# Patient Record
Sex: Male | Born: 1940 | Race: White | Hispanic: No | Marital: Married | Smoking: Former smoker
Health system: Southern US, Community
[De-identification: ages and names within clinical notes are randomized; demographics above are authoritative.]

## PROBLEM LIST (undated history)

## (undated) DIAGNOSIS — D72829 Elevated white blood cell count, unspecified: Secondary | ICD-10-CM

## (undated) DIAGNOSIS — D649 Anemia, unspecified: Secondary | ICD-10-CM

## (undated) DIAGNOSIS — K589 Irritable bowel syndrome without diarrhea: Secondary | ICD-10-CM

## (undated) DIAGNOSIS — E119 Type 2 diabetes mellitus without complications: Secondary | ICD-10-CM

## (undated) DIAGNOSIS — I350 Nonrheumatic aortic (valve) stenosis: Secondary | ICD-10-CM

## (undated) DIAGNOSIS — R972 Elevated prostate specific antigen [PSA]: Secondary | ICD-10-CM

## (undated) DIAGNOSIS — N183 Chronic kidney disease, stage 3 unspecified: Secondary | ICD-10-CM

## (undated) DIAGNOSIS — I10 Essential (primary) hypertension: Secondary | ICD-10-CM

## (undated) DIAGNOSIS — I4510 Unspecified right bundle-branch block: Secondary | ICD-10-CM

## (undated) DIAGNOSIS — K219 Gastro-esophageal reflux disease without esophagitis: Secondary | ICD-10-CM

## (undated) DIAGNOSIS — E669 Obesity, unspecified: Secondary | ICD-10-CM

## (undated) DIAGNOSIS — M1990 Unspecified osteoarthritis, unspecified site: Secondary | ICD-10-CM

## (undated) DIAGNOSIS — N529 Male erectile dysfunction, unspecified: Secondary | ICD-10-CM

## (undated) DIAGNOSIS — M720 Palmar fascial fibromatosis [Dupuytren]: Secondary | ICD-10-CM

## (undated) DIAGNOSIS — E785 Hyperlipidemia, unspecified: Secondary | ICD-10-CM

## (undated) DIAGNOSIS — B351 Tinea unguium: Secondary | ICD-10-CM

## (undated) DIAGNOSIS — I2510 Atherosclerotic heart disease of native coronary artery without angina pectoris: Secondary | ICD-10-CM

## (undated) HISTORY — DX: Nonrheumatic aortic (valve) stenosis: I35.0

## (undated) HISTORY — DX: Type 2 diabetes mellitus without complications: E11.9

## (undated) HISTORY — DX: Anemia, unspecified: D64.9

## (undated) HISTORY — PX: OTHER SURGICAL HISTORY: SHX169

## (undated) HISTORY — DX: Tinea unguium: B35.1

## (undated) HISTORY — DX: Palmar fascial fibromatosis (dupuytren): M72.0

## (undated) HISTORY — DX: Unspecified right bundle-branch block: I45.10

## (undated) HISTORY — DX: Hyperlipidemia, unspecified: E78.5

## (undated) HISTORY — DX: Essential (primary) hypertension: I10

## (undated) HISTORY — DX: Atherosclerotic heart disease of native coronary artery without angina pectoris: I25.10

## (undated) HISTORY — DX: Irritable bowel syndrome, unspecified: K58.9

## (undated) HISTORY — DX: Chronic kidney disease, stage 3 unspecified: N18.30

## (undated) HISTORY — DX: Obesity, unspecified: E66.9

## (undated) HISTORY — DX: Elevated prostate specific antigen (PSA): R97.20

## (undated) HISTORY — DX: Male erectile dysfunction, unspecified: N52.9

## (undated) HISTORY — PX: APPENDECTOMY: SHX54

---
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
## 1993-01-06 DIAGNOSIS — I219 Acute myocardial infarction, unspecified: Secondary | ICD-10-CM

## 1993-01-06 HISTORY — DX: Acute myocardial infarction, unspecified: I21.9

## 1998-01-09 ENCOUNTER — Ambulatory Visit (HOSPITAL_BASED_OUTPATIENT_CLINIC_OR_DEPARTMENT_OTHER): Admission: RE | Admit: 1998-01-09 | Discharge: 1998-01-09 | Payer: Self-pay | Admitting: Orthopedic Surgery

## 1998-12-17 ENCOUNTER — Other Ambulatory Visit: Admission: RE | Admit: 1998-12-17 | Discharge: 1998-12-17 | Payer: Self-pay | Admitting: Urology

## 1999-05-22 ENCOUNTER — Encounter: Payer: Self-pay | Admitting: Cardiology

## 1999-05-22 ENCOUNTER — Ambulatory Visit (HOSPITAL_COMMUNITY): Admission: RE | Admit: 1999-05-22 | Discharge: 1999-05-23 | Payer: Self-pay | Admitting: Cardiology

## 2000-04-15 ENCOUNTER — Encounter: Payer: Self-pay | Admitting: Emergency Medicine

## 2000-04-16 ENCOUNTER — Observation Stay (HOSPITAL_COMMUNITY): Admission: EM | Admit: 2000-04-16 | Discharge: 2000-04-16 | Payer: Self-pay | Admitting: Emergency Medicine

## 2000-09-30 ENCOUNTER — Encounter (INDEPENDENT_AMBULATORY_CARE_PROVIDER_SITE_OTHER): Payer: Self-pay | Admitting: Specialist

## 2000-09-30 ENCOUNTER — Ambulatory Visit (HOSPITAL_BASED_OUTPATIENT_CLINIC_OR_DEPARTMENT_OTHER): Admission: RE | Admit: 2000-09-30 | Discharge: 2000-09-30 | Payer: Self-pay | Admitting: Plastic Surgery

## 2001-07-01 ENCOUNTER — Encounter: Payer: Self-pay | Admitting: Orthopedic Surgery

## 2001-07-01 ENCOUNTER — Ambulatory Visit (HOSPITAL_COMMUNITY): Admission: RE | Admit: 2001-07-01 | Discharge: 2001-07-01 | Payer: Self-pay | Admitting: Orthopedic Surgery

## 2003-06-01 ENCOUNTER — Encounter: Admission: RE | Admit: 2003-06-01 | Discharge: 2003-06-01 | Payer: Self-pay | Admitting: Rheumatology

## 2004-03-05 ENCOUNTER — Encounter (INDEPENDENT_AMBULATORY_CARE_PROVIDER_SITE_OTHER): Payer: Self-pay | Admitting: *Deleted

## 2004-03-05 ENCOUNTER — Ambulatory Visit (HOSPITAL_COMMUNITY): Admission: RE | Admit: 2004-03-05 | Discharge: 2004-03-05 | Payer: Self-pay | Admitting: Gastroenterology

## 2005-11-04 ENCOUNTER — Ambulatory Visit (HOSPITAL_BASED_OUTPATIENT_CLINIC_OR_DEPARTMENT_OTHER): Admission: RE | Admit: 2005-11-04 | Discharge: 2005-11-04 | Payer: Self-pay | Admitting: Orthopedic Surgery

## 2009-05-02 ENCOUNTER — Observation Stay (HOSPITAL_COMMUNITY): Admission: EM | Admit: 2009-05-02 | Discharge: 2009-05-02 | Payer: Self-pay | Admitting: Internal Medicine

## 2009-05-02 ENCOUNTER — Ambulatory Visit: Payer: Self-pay | Admitting: Internal Medicine

## 2010-03-26 LAB — COMPREHENSIVE METABOLIC PANEL
Albumin: 3.7 g/dL (ref 3.5–5.2)
BUN: 20 mg/dL (ref 6–23)
Calcium: 9.4 mg/dL (ref 8.4–10.5)
Creatinine, Ser: 1.32 mg/dL (ref 0.4–1.5)
Glucose, Bld: 159 mg/dL — ABNORMAL HIGH (ref 70–99)
Potassium: 4 mEq/L (ref 3.5–5.1)
Total Protein: 7.4 g/dL (ref 6.0–8.3)

## 2010-03-26 LAB — GLUCOSE, CAPILLARY: Glucose-Capillary: 215 mg/dL — ABNORMAL HIGH (ref 70–99)

## 2010-03-26 LAB — CBC
HCT: 35.2 % — ABNORMAL LOW (ref 39.0–52.0)
Hemoglobin: 12.4 g/dL — ABNORMAL LOW (ref 13.0–17.0)
MCHC: 35.3 g/dL (ref 30.0–36.0)
MCV: 92.8 fL (ref 78.0–100.0)
Platelets: 249 10*3/uL (ref 150–400)
RBC: 3.79 MIL/uL — ABNORMAL LOW (ref 4.22–5.81)
RDW: 13.1 % (ref 11.5–15.5)
WBC: 8.9 10*3/uL (ref 4.0–10.5)

## 2010-03-26 LAB — BRAIN NATRIURETIC PEPTIDE: Pro B Natriuretic peptide (BNP): <30 pg/mL (ref 0.0–100.0)

## 2010-03-26 LAB — LIPID PANEL
Cholesterol: 121 mg/dL (ref 0–200)
HDL: 39 mg/dL — ABNORMAL LOW (ref >39)
LDL Cholesterol: 64 mg/dL (ref 0–99)
Total CHOL/HDL Ratio: 3.1 RATIO
VLDL: 18 mg/dL (ref 0–40)

## 2010-03-26 LAB — DIFFERENTIAL
Basophils Absolute: 0.1 10*3/uL (ref 0.0–0.1)
Basophils Relative: 1 % (ref 0–1)
Eosinophils Absolute: 0.2 10*3/uL (ref 0.0–0.7)
Eosinophils Relative: 2 % (ref 0–5)
Monocytes Absolute: 0.7 10*3/uL (ref 0.1–1.0)
Neutro Abs: 5.3 10*3/uL (ref 1.7–7.7)

## 2010-03-26 LAB — HEMOGLOBIN A1C
Hgb A1c MFr Bld: 8.2 % — ABNORMAL HIGH (ref <5.7)
Mean Plasma Glucose: 189 mg/dL — ABNORMAL HIGH (ref <117)

## 2010-03-26 LAB — PROTIME-INR: Prothrombin Time: 13.4 seconds (ref 11.6–15.2)

## 2010-03-26 LAB — POCT CARDIAC MARKERS
CKMB, poc: 1.3 ng/mL (ref 1.0–8.0)
Myoglobin, poc: 76.2 ng/mL (ref 12–200)
Myoglobin, poc: 81.6 ng/mL (ref 12–200)

## 2010-03-26 LAB — TROPONIN I: Troponin I: <0.01 ng/mL (ref 0.00–0.06)

## 2010-05-24 NOTE — H&P (Signed)
[place]. [facility]  Patient:    Barry Zavala, Barry Zavala                     MRN: [id number] Adm. Date:  [id number] Attending:  Corliss Marcus CC:         Francisca December, M.D.  Demetria Pore. Coral Spikes, M.D.   History and Physical  REASON FOR ADMISSION:  Chest discomfort.  SUBJECTIVE:  [name] is a 70 year old, gentleman with a history of coronary artery disease, who suffered an inferior myocardial infarction in 1995 and underwent angioplasty and stenting of the right coronary by Dr. Corliss Marcus.  He subsequently had circumflex angioplasty and stent during that same hospital admission.  He has been asymptomatic.  Screening stress test in mid spring of 2001 demonstrated anterior ischemia and repeat cardiac catheterization revealed significant LAD and diagonal disease.  He subsequently underwent LAD stenting and Cutting Balloon angioplasty on the diagonal and has been asymptomatic.  This evening, the patient developed perhaps 15 minutes of substernal chest pressure that was mild in intensity and had no associated symptoms.  He called me about this immediately.  While we spoke about it on the phone, the discomfort resolved.  We had planned for him to come to the office to be evaluated by Dr. Amil Amen later today.  After we finished speaking on the phone he had another episode of very mild substernal discomfort.  I had instructed him to come to the emergency room if he had any recurrence of pain following the initially episode.  In the emergency room, the ECG did not reveal acute ischemic changes and he was totally pain-free. He is admitted to the hospital to rule out myocardial infarction and for possible further cardiac evaluation per Dr. Amil Amen.  PAST MEDICAL HISTORY:  Significant medical problems: 1. Hyperlipidemia. 2. Diabetes. 3. Atherosclerotic cardiovascular disease as outlined above. 4. Prior history of elevated PSA.  ALLERGIES:  None.  HABITS:  Does  not drink.  Stopped smoking in 1986.  FAMILY HISTORY:  Positive for diabetes, ovarian cancer and heart disease above age 45.  REVIEW OF SYSTEMS:  Unremarkable.  MEDICATIONS: 1. Avandia 8 mg p.o. q.p.m. 2. Zocor 40 mg p.o. q.d. 3. Glucophage 1000 mg p.o. b.i.d. 4. Multivitamin one per day. 5. Glucotrol XL 10 mg per day.  OBJECTIVE:  GENERAL:  On examination, [name] is in no distress.  SKIN:  His skin color is normal.  VITAL SIGNS:  Blood pressure is 132/72, the heart rate is 70, respirations are 16 and nonlabored.  HEENT:  Examination grossly unremarkable.  No xanthelasma.  NECK:  No JVD or carotid bruits.  LUNGS:  Clear to auscultation and percussion.  CARDIAC:  Positive S4 but otherwise unremarkable.  ABDOMEN:  Soft.  EXTREMITIES:  Reveal no edema.  Femoral are 2+.  NEUROLOGICAL: Examination reveals an alert, oriented patient x 3.  LABORATORY AND ACCESSORY DATA:  ECG reveals evidence of old inferior infarction with small inferior Q waves but no acute ischemic change.  Laboratory data includes a potassium of 3.9, creatinine of 1.2.   The initial CPK is normal.  The troponin I is less than 0.01.  ASSESSMENT: 1. Recurrent episodes of chest discomfort of anginal quality. 2. History of coronary atherosclerotic heart disease with prior inferior    infarction and circumflex and right coronary stent, 1995.  More recent    left anterior descending diagonal intervention with left anterior    descending stent and diagonal Cutting Balloon May 1991.  3. Hyperlipidemia. 4. Diabetes.  RECOMMENDATIONS:  Admit to rule out unit.  Serial enzymes.  Serial ECGs. Evaluation per Dr. Corliss Marcus as indicated. DD:  04/16/00 TD:  04/16/00 Job: [id number]

## 2010-05-24 NOTE — Op Note (Signed)
NAME:  Barry Zavala, Barry Zavala                ACCOUNT NO.:  i   MEDICAL RECORD NO.:  [id number]          PATIENT TYPE:  AMB   LOCATION:  DSC                          FACILITY:  [facility]   PHYSICIAN:  Katy Fitch. Sypher, M.D. DATE OF BIRTH:  Jan 06, 1941   DATE OF PROCEDURE:  11/04/2005  DATE OF DISCHARGE:                                 OPERATIVE REPORT   PREOPERATIVE DIAGNOSES:  1. 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  2. 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   POSTOPERATIVE DIAGNOSES:  1. Inflammatory tenosynovitis of left long finger, with fibrosis of A1      pulley.  2. Fibromatosis of long finger pre-tendinous fibers.   OPERATION:  1. 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  2. Release of left long finger A1 pulley.   SURGEON:  Katy Fitch. Sypher, M.D.   ASSISTANT:  Marveen Reeks. Dasnoit P.A.-C.   ANESTHESIA:  Is 2% lidocaine palmar block, supplemented by IV sedation.   SUPERVISING ANESTHESIOLOGIST:  Germaine Pomfret, M.D.   INDICATIONS:  [name] is a 70 year old gentleman who presents  for evaluation of chronic stenosing tenosynovitis of his left long finger.  He has a history of stenosing tenosynovitis affecting his left ring finger  and has had Dupuytren's palmar fibromatosis cords resected from the ring  finger ray in the past.   He has type 2 diabetes.  He has failed nonoperative management of his  Dupuytren's contracture and his stenosing tenosynovitis.  Therefore he is  brought to the operating at this time for release of his left long finger A1  pulley and release of his left long finger pre-tendinous fiber contracture.   DESCRIPTION OF PROCEDURE:  After informed consent he is brought to the  operating room at this time.  [name] is brought to the  operating room and placed in the supine position upon the operating table.  Following light sedation,  the left arm was prepped with Betadine soap and  solution and sterilely draped.  A pneumatic tourniquet was applied to the  proximal left brachium.  Following exsanguination of the left arm with an  Esmarch bandage, an arterial tourniquet on the proximal brachium was  inflated to 140 mmHg.  The procedure commenced with a Brunner zigzag  incision fashioned over the A1 pulley, extending from the middle palmar  crease across the distal palmar crease towards the proximal flexion crease  of the long finger.  The subcutaneous tissues were carefully divided, taking  care to gently identify the pre-tendinous fibers of palmar fascia.  Mr.  Barry Zavala had a remarkable amount of adipose tissue superficial to the pre-  tendinous fibers.  This was carefully dissected, identifying the underlying  fascia.  The fascia was resected.  The A1 pulley was invested in a thick  inflammatory cuff of fibrovascular tissue.  This was cleared with a rongeur  and Freer.  The A1 pulley was isolated and split along its radial border  with scissors and scalpel.  The flexor tendons  were delivered and found to  be invested with fibrotic teno-synovium.  This was removed with scissors and  rongeur dissection.  [name] demonstrated a full range of motion of his  left fingers after his sedation cleared.  The wound was inspected for  bleeding points and subsequently repaired with mattress sutures of #5-0  nylon.  There no apparent complications.  For after-care he is placed in a  compressive dressing with Xeroflo,  sterile gauze and an Ace wrap.   We have encouraged him to get immediate range of motion exercises.  We will  see him back for followup in one week for suture removal.  He is provided  Darvocet-N-100, one p.o. q.4-6 hours p.r.n.  pain, #20 tablets, without  refill.      Katy Fitch Sypher, M.D.  Electronically Signed     RVS/MEDQ  D:  11/04/2005  T:  11/04/2005  Job:  [id number]   cc:   Leatha Gilding. Mezer, M.D.   Demetria Pore. Coral Spikes, M.D.

## 2010-05-24 NOTE — Op Note (Signed)
[place]. [facility]  Patient:    Barry Zavala, Barry Zavala                     MRN: [id number] Proc. Date: 05/22/99 Adm. Date:  [id number] Disc. Date: [id number] Attending:  Corliss Zavala CC:         Barry Zavala. Barry Zavala, M.D.             Cardiac Cath. Lab.                           Operative Report  CINE NUMBER:  [id number]  REFERRING PHYSICIAN:  Dr. Lennox Zavala.  PROCEDURE PERFORMED: 1. Left heart catheterization. 2. Coronary angiography. 3. Left ventriculogram. 4. Percutaneous transluminal coronary angioplasty/cutting balloon second    diagonal. 5. Percutaneous transluminal coronary angioplasty/stent mid to distal left    anterior descending. 6. Percutaneous closure right femoral artery.  INDICATIONS FOR PROCEDURE:  [name] is a 70 year old man who is six years status post PTCA and stent of the right coronary and obtuse marginal branches.  He has done well without recurrent angina, and underwent a screening exercise Cardiolite study which was significant for evidence of anterolateral wall ischemia.  He is brought now to the catheterization laboratory to identify possible progressive disease and provide for further therapeutic options.  PROCEDURAL NOTE:  The patient is brought to the cardiac catheterization laboratory in the postabsorptive state.  The right groin was prepped and draped in the usual sterile fashion.  Local anesthesia was obtained with the infiltration of 1% Lidocaine.  A #5 French catheter sheath was introduced percutaneously into the right femoral artery utilizing an anterior approach over a guiding J-wire.  A #5 French 110 cm pigtail catheter was then advanced to the ascending aorta where the pressure was recorded.  The catheter was then prolapsed across the aortic valve and the pressure again recorded in the left ventricle both prior to and following the ventriculogram.  A 30 degree RAO cine left ventriculogram was performed  using a power injector.  Omnipaque 45 cc was injected at 13 cc/sec.  Coronary angiography was then performed following the sublingual administration of 0.4 mg nitroglycerin.  The patient had also received 1500 units of heparin prior to placement of the pigtail catheter.  Cineangiography of each coronary artery was conducted in multiple LAO and RAO projections using #5 [place] #4 left and right Judkins catheters.  We then proceeded with preparations for coronary intervention.  The #5 [place] catheter sheath was exchanged for #7 [place] catheter sheath over a long guiding J-wire.  The patient received an additional 3500 units of heparin and a double bolus of Integrilin.  A #7 French 3.5 [place]oda Sci-Med WiseGuide guiding catheter was advanced to the ascending aorta where the left coronary os was engaged.  A 0.14 inch ACS Hi-Torque floppy extra support guide wire was advanced across the lesion into the second diagonal without difficulty.  This was followed by a 2.5/10 mm cutting balloon.  This was advanced without excessive difficulty across the stenosis in the proximal large second diagonal.  It was inflated on two occasions to a maximum of 6 atmospheres for a maximum of two minutes.  The patient did experience burning anterior chest pain with this.  Following this, the balloon was removed.  The wire was advanced down the main trunk of the left anterior descending artery.  A 3.0/81mm Sci-Med NIR Royale intracoronary stent was advanced across the midvessel stenosis.  This was ultimately deployed at a pressure of 16 atmospheres.  The stent balloon and guide wire were removed.  Adequate patency was confirmed on orthogonal views by cineangiography.  The guiding catheter was removed. The sheath was removed as well and hemostasis is obtained at the puncture site in the right femoral artery utilizing the Perclose percutaneous closure system.  The patient was transported to the recovery area in  stable condition with intact distal pulses.  FLUOROSCOPY TIME:  13.2 minutes.  TOTAL CONTRAST UTILIZED:  300 cc of Omnipaque and Visipaque.  HEMODYNAMICS:  Systemic arterial pressure was 117/72 with a mean of 92 mmHg. The left ventricular end-diastolic pressure was 12 mmHg preventriculogram and 14 mmHg postventriculogram.  There was no systolic gradient across the aortic valve.  ANGIOGRAPHY:  The left ventriculogram demonstrated normal chamber size and normal global systolic function with a calculated ejection fraction utilizing a single plane cine method of 68%.  There were no regional wall motion abnormalities.  There is no mitral regurgitation.  There was left and right coronary calcification seen.  There was a right dominant coronary system present.  The main left coronary artery appeared normal.  The left anterior descending artery and its branches were moderately and diffusely diseased.  The first diagonal branch is small.  The second diagonal branch is large.  The third diagonal branch is small.  The second diagonal branch had an 75% proximal stenosis.  There were luminal irregularities and 20-30% stenoses throughout the course of the left anterior descending artery in the mid to distal segment.  After the third diagonal branch, there was a 50-60% stenosis.  The left circumflex artery and its branches were mildly diseased; a single large marginal branch arose which was the site of previous angioplasty.  This vessel demonstrates no significant obstructions.  In the proximal circumflex, there is a tapering 20-30% stenosis as it arises from the left main.  The ongoing circumflex is small and gives rise to two small posterolateral branches.  The right coronary artery and its branches were mildly diseased; again, there are luminal irregularities and 20-30% stenoses seen throughout the course of the vessel.  The proximal segment is the site of the J and J stent implantation in  1995.  It is widely patent.  The vessel gives rise to a  moderate to large posterior descending artery which is free of significant disease.  There is a moderate sized posterolateral segment and two small left ventricular branches and then a large third left ventricular branch.  No significant obstructions are seen within the course of these vessels.  There are no collateral vessels seen.  Following balloon dilatation using a cutting balloon in the diagonal branch, there is a 10% residual stenosis in the diagonal branch and no residual stenosis in the stented segment of the left anterior descending artery.  FINAL IMPRESSION: 1. Arteriosclerotic cardiovascular disease, three vessel. 2. Status post successful cutting balloon percutaneous transluminal coronary    angioplasty of the diagonal branch. 3. Status post successful percutaneous transluminal coronary angioplasty stent    implantation mid-to-distal left anterior descending. 4. Intact left ventricular size and systolic function. DD:  05/22/99 TD:  05/26/99 Job: [id number]

## 2010-05-24 NOTE — Op Note (Signed)
[place]. [facility]  Patient:    Barry Zavala, PIGNATO Visit Number: [id number] MRN: [id number]          Service Type: DSU Location: [facility] Attending Physician:  Chapman Moss Dictated by:   Teena Irani. Odis Luster, M.D. Proc. Date: 09/30/00 Admit Date:  09/30/2000                             Operative Report  PREOPERATIVE DIAGNOSES: 1. Cutaneous lesion right cheek of undetermined behavior, enlarging. 2. Skin lesion left forehead, enlarging and of undetermined behavior.  POSTOPERATIVE DIAGNOSIS: 1. Cutaneous lesion right cheek of undetermined behavior, enlarging. 2. Skin lesion left forehead, enlarging and of undetermined behavior. 3. Wounds cheek and forehead.  PROCEDURES PERFORMED: 1. Excision lesion right cheek, subcutaneous, undetermined behavior, greater    than 0.5 cm. 2. Excision lesion left forehead, skin, undetermined behavior. 3. Intermediate wound closures greater than 1.0 cm in total length.  SURGEON:  Teena Irani. Odis Luster, M.D.  ANESTHESIA:  1% Xylocaine with epinephrine plus bicarbonate.  CLINICAL ______:  A 70 year old man with lesion right cheek that seems to be within the soft tissues that is growing, enlarging.  He is very concerned about it.  In addition, he has a lesion on his left forehead that has changed and he is concerned about it as well and would like to have that excised.  The nature of these procedures and risks and complications were discussed with him and he understood all this and wished to proceed.  He understood that there would be some scars associated with these areas and these excisions.  DESCRIPTION OF PROCEDURE:  The patient was placed supine.  The elliptical incisions were marked in the direction of the relaxed skin tension lines.  He was prepped with Betadine and draped with sterile drapes.  Successful local anesthesia was achieved.  The elliptical excision performed, excising the subcutaneous lesion down to the  depth and excised in its entirety.  Good hemostasis was noted.  The wound was irrigated thoroughly and layered closure 5-0 Monocryl interrupted inverted deep sutures, 5-0 Monocryl interrupted inverted deep dermal sutures, and 6-0 Prolene simple interrupted sutures.  The elliptical excision of the forehead lesion was then performed.  The wound irrigated thoroughly.  Good hemostasis again noted and again 5-0 Monocryl interrupted inverted deep suture and 6-0 Prolene simple interrupted sutures. Antibiotic ointment and dry, sterile dressings applied and he tolerated the procedure well.  DISPOSITION:  May shower tomorrow and reapply antibiotic ointment once. Activity ad lib.  Tylenol for pain.  See me back next week for recheck and suture removal. Dictated by:   Teena Irani. Odis Luster, M.D. Attending Physician:  Chapman Moss DD:  09/30/00 TD:  09/30/00 Job: [id number]

## 2011-01-20 DIAGNOSIS — M720 Palmar fascial fibromatosis [Dupuytren]: Secondary | ICD-10-CM | POA: Diagnosis not present

## 2011-01-20 DIAGNOSIS — M6530 Trigger finger, unspecified finger: Secondary | ICD-10-CM | POA: Diagnosis not present

## 2011-02-12 DIAGNOSIS — M6530 Trigger finger, unspecified finger: Secondary | ICD-10-CM | POA: Diagnosis not present

## 2011-02-18 DIAGNOSIS — I2510 Atherosclerotic heart disease of native coronary artery without angina pectoris: Secondary | ICD-10-CM | POA: Diagnosis not present

## 2011-02-18 DIAGNOSIS — B351 Tinea unguium: Secondary | ICD-10-CM | POA: Diagnosis not present

## 2011-02-18 DIAGNOSIS — I10 Essential (primary) hypertension: Secondary | ICD-10-CM | POA: Diagnosis not present

## 2011-02-18 DIAGNOSIS — Z79899 Other long term (current) drug therapy: Secondary | ICD-10-CM | POA: Diagnosis not present

## 2011-02-18 DIAGNOSIS — IMO0001 Reserved for inherently not codable concepts without codable children: Secondary | ICD-10-CM | POA: Diagnosis not present

## 2011-02-18 DIAGNOSIS — I4510 Unspecified right bundle-branch block: Secondary | ICD-10-CM | POA: Diagnosis not present

## 2011-02-18 DIAGNOSIS — Z0000 Encounter for general adult medical examination without abnormal findings: Secondary | ICD-10-CM | POA: Diagnosis not present

## 2011-02-18 DIAGNOSIS — E782 Mixed hyperlipidemia: Secondary | ICD-10-CM | POA: Diagnosis not present

## 2011-03-11 DIAGNOSIS — I2510 Atherosclerotic heart disease of native coronary artery without angina pectoris: Secondary | ICD-10-CM | POA: Diagnosis not present

## 2011-03-11 DIAGNOSIS — N058 Unspecified nephritic syndrome with other morphologic changes: Secondary | ICD-10-CM | POA: Diagnosis not present

## 2011-03-11 DIAGNOSIS — E785 Hyperlipidemia, unspecified: Secondary | ICD-10-CM | POA: Diagnosis not present

## 2011-03-11 DIAGNOSIS — E1129 Type 2 diabetes mellitus with other diabetic kidney complication: Secondary | ICD-10-CM | POA: Diagnosis not present

## 2011-04-30 DIAGNOSIS — N138 Other obstructive and reflux uropathy: Secondary | ICD-10-CM | POA: Diagnosis not present

## 2011-04-30 DIAGNOSIS — N401 Enlarged prostate with lower urinary tract symptoms: Secondary | ICD-10-CM | POA: Diagnosis not present

## 2011-05-01 DIAGNOSIS — H2510 Age-related nuclear cataract, unspecified eye: Secondary | ICD-10-CM | POA: Diagnosis not present

## 2011-05-01 DIAGNOSIS — E119 Type 2 diabetes mellitus without complications: Secondary | ICD-10-CM | POA: Diagnosis not present

## 2011-05-13 DIAGNOSIS — R972 Elevated prostate specific antigen [PSA]: Secondary | ICD-10-CM | POA: Diagnosis not present

## 2011-05-13 DIAGNOSIS — N401 Enlarged prostate with lower urinary tract symptoms: Secondary | ICD-10-CM | POA: Diagnosis not present

## 2011-05-13 DIAGNOSIS — N138 Other obstructive and reflux uropathy: Secondary | ICD-10-CM | POA: Diagnosis not present

## 2011-06-11 DIAGNOSIS — B351 Tinea unguium: Secondary | ICD-10-CM | POA: Diagnosis not present

## 2011-06-11 DIAGNOSIS — E782 Mixed hyperlipidemia: Secondary | ICD-10-CM | POA: Diagnosis not present

## 2011-06-11 DIAGNOSIS — Z79899 Other long term (current) drug therapy: Secondary | ICD-10-CM | POA: Diagnosis not present

## 2011-06-11 DIAGNOSIS — I10 Essential (primary) hypertension: Secondary | ICD-10-CM | POA: Diagnosis not present

## 2011-06-11 DIAGNOSIS — E785 Hyperlipidemia, unspecified: Secondary | ICD-10-CM | POA: Diagnosis not present

## 2011-06-11 DIAGNOSIS — Z0000 Encounter for general adult medical examination without abnormal findings: Secondary | ICD-10-CM | POA: Diagnosis not present

## 2011-06-11 DIAGNOSIS — N058 Unspecified nephritic syndrome with other morphologic changes: Secondary | ICD-10-CM | POA: Diagnosis not present

## 2011-06-11 DIAGNOSIS — I4510 Unspecified right bundle-branch block: Secondary | ICD-10-CM | POA: Diagnosis not present

## 2011-06-11 DIAGNOSIS — E1165 Type 2 diabetes mellitus with hyperglycemia: Secondary | ICD-10-CM | POA: Diagnosis not present

## 2011-06-11 DIAGNOSIS — I2510 Atherosclerotic heart disease of native coronary artery without angina pectoris: Secondary | ICD-10-CM | POA: Diagnosis not present

## 2011-06-11 DIAGNOSIS — IMO0001 Reserved for inherently not codable concepts without codable children: Secondary | ICD-10-CM | POA: Diagnosis not present

## 2011-06-11 DIAGNOSIS — E1129 Type 2 diabetes mellitus with other diabetic kidney complication: Secondary | ICD-10-CM | POA: Diagnosis not present

## 2011-09-10 DIAGNOSIS — M6530 Trigger finger, unspecified finger: Secondary | ICD-10-CM | POA: Diagnosis not present

## 2011-09-16 DIAGNOSIS — I2510 Atherosclerotic heart disease of native coronary artery without angina pectoris: Secondary | ICD-10-CM | POA: Diagnosis not present

## 2011-09-16 DIAGNOSIS — E1165 Type 2 diabetes mellitus with hyperglycemia: Secondary | ICD-10-CM | POA: Diagnosis not present

## 2011-09-16 DIAGNOSIS — E1129 Type 2 diabetes mellitus with other diabetic kidney complication: Secondary | ICD-10-CM | POA: Diagnosis not present

## 2011-09-16 DIAGNOSIS — N058 Unspecified nephritic syndrome with other morphologic changes: Secondary | ICD-10-CM | POA: Diagnosis not present

## 2011-09-16 DIAGNOSIS — Z23 Encounter for immunization: Secondary | ICD-10-CM | POA: Diagnosis not present

## 2011-12-16 DIAGNOSIS — E782 Mixed hyperlipidemia: Secondary | ICD-10-CM | POA: Diagnosis not present

## 2011-12-16 DIAGNOSIS — E119 Type 2 diabetes mellitus without complications: Secondary | ICD-10-CM | POA: Diagnosis not present

## 2011-12-16 DIAGNOSIS — I10 Essential (primary) hypertension: Secondary | ICD-10-CM | POA: Diagnosis not present

## 2011-12-16 DIAGNOSIS — I2510 Atherosclerotic heart disease of native coronary artery without angina pectoris: Secondary | ICD-10-CM | POA: Diagnosis not present

## 2011-12-17 DIAGNOSIS — E785 Hyperlipidemia, unspecified: Secondary | ICD-10-CM | POA: Diagnosis not present

## 2011-12-17 DIAGNOSIS — E1165 Type 2 diabetes mellitus with hyperglycemia: Secondary | ICD-10-CM | POA: Diagnosis not present

## 2011-12-17 DIAGNOSIS — E1129 Type 2 diabetes mellitus with other diabetic kidney complication: Secondary | ICD-10-CM | POA: Diagnosis not present

## 2011-12-17 DIAGNOSIS — I2510 Atherosclerotic heart disease of native coronary artery without angina pectoris: Secondary | ICD-10-CM | POA: Diagnosis not present

## 2011-12-17 DIAGNOSIS — N058 Unspecified nephritic syndrome with other morphologic changes: Secondary | ICD-10-CM | POA: Diagnosis not present

## 2011-12-26 DIAGNOSIS — I10 Essential (primary) hypertension: Secondary | ICD-10-CM | POA: Diagnosis not present

## 2012-02-25 DIAGNOSIS — I2510 Atherosclerotic heart disease of native coronary artery without angina pectoris: Secondary | ICD-10-CM | POA: Diagnosis not present

## 2012-02-25 DIAGNOSIS — I4510 Unspecified right bundle-branch block: Secondary | ICD-10-CM | POA: Diagnosis not present

## 2012-02-25 DIAGNOSIS — B351 Tinea unguium: Secondary | ICD-10-CM | POA: Diagnosis not present

## 2012-02-25 DIAGNOSIS — Z0000 Encounter for general adult medical examination without abnormal findings: Secondary | ICD-10-CM | POA: Diagnosis not present

## 2012-02-25 DIAGNOSIS — Z1331 Encounter for screening for depression: Secondary | ICD-10-CM | POA: Diagnosis not present

## 2012-02-25 DIAGNOSIS — E782 Mixed hyperlipidemia: Secondary | ICD-10-CM | POA: Diagnosis not present

## 2012-02-25 DIAGNOSIS — Z79899 Other long term (current) drug therapy: Secondary | ICD-10-CM | POA: Diagnosis not present

## 2012-02-25 DIAGNOSIS — I10 Essential (primary) hypertension: Secondary | ICD-10-CM | POA: Diagnosis not present

## 2012-02-25 DIAGNOSIS — IMO0001 Reserved for inherently not codable concepts without codable children: Secondary | ICD-10-CM | POA: Diagnosis not present

## 2012-04-06 DIAGNOSIS — E118 Type 2 diabetes mellitus with unspecified complications: Secondary | ICD-10-CM | POA: Diagnosis not present

## 2012-04-06 DIAGNOSIS — I10 Essential (primary) hypertension: Secondary | ICD-10-CM | POA: Diagnosis not present

## 2012-04-06 DIAGNOSIS — Z1331 Encounter for screening for depression: Secondary | ICD-10-CM | POA: Diagnosis not present

## 2012-04-06 DIAGNOSIS — N058 Unspecified nephritic syndrome with other morphologic changes: Secondary | ICD-10-CM | POA: Diagnosis not present

## 2012-04-06 DIAGNOSIS — E785 Hyperlipidemia, unspecified: Secondary | ICD-10-CM | POA: Diagnosis not present

## 2012-04-28 DIAGNOSIS — B351 Tinea unguium: Secondary | ICD-10-CM | POA: Diagnosis not present

## 2012-04-28 DIAGNOSIS — IMO0001 Reserved for inherently not codable concepts without codable children: Secondary | ICD-10-CM | POA: Diagnosis not present

## 2012-04-28 DIAGNOSIS — N183 Chronic kidney disease, stage 3 unspecified: Secondary | ICD-10-CM | POA: Diagnosis not present

## 2012-05-10 DIAGNOSIS — E119 Type 2 diabetes mellitus without complications: Secondary | ICD-10-CM | POA: Diagnosis not present

## 2012-05-10 DIAGNOSIS — H2510 Age-related nuclear cataract, unspecified eye: Secondary | ICD-10-CM | POA: Diagnosis not present

## 2012-05-10 DIAGNOSIS — H26019 Infantile and juvenile cortical, lamellar, or zonular cataract, unspecified eye: Secondary | ICD-10-CM | POA: Diagnosis not present

## 2012-05-18 DIAGNOSIS — N401 Enlarged prostate with lower urinary tract symptoms: Secondary | ICD-10-CM | POA: Diagnosis not present

## 2012-05-18 DIAGNOSIS — R972 Elevated prostate specific antigen [PSA]: Secondary | ICD-10-CM | POA: Diagnosis not present

## 2012-05-18 DIAGNOSIS — N138 Other obstructive and reflux uropathy: Secondary | ICD-10-CM | POA: Diagnosis not present

## 2012-05-25 DIAGNOSIS — N401 Enlarged prostate with lower urinary tract symptoms: Secondary | ICD-10-CM | POA: Diagnosis not present

## 2012-05-25 DIAGNOSIS — N529 Male erectile dysfunction, unspecified: Secondary | ICD-10-CM | POA: Diagnosis not present

## 2012-05-25 DIAGNOSIS — N138 Other obstructive and reflux uropathy: Secondary | ICD-10-CM | POA: Diagnosis not present

## 2012-05-25 DIAGNOSIS — E119 Type 2 diabetes mellitus without complications: Secondary | ICD-10-CM | POA: Diagnosis not present

## 2012-05-25 DIAGNOSIS — R972 Elevated prostate specific antigen [PSA]: Secondary | ICD-10-CM | POA: Diagnosis not present

## 2012-06-09 DIAGNOSIS — I10 Essential (primary) hypertension: Secondary | ICD-10-CM | POA: Diagnosis not present

## 2012-06-09 DIAGNOSIS — E1165 Type 2 diabetes mellitus with hyperglycemia: Secondary | ICD-10-CM | POA: Diagnosis not present

## 2012-06-09 DIAGNOSIS — E785 Hyperlipidemia, unspecified: Secondary | ICD-10-CM | POA: Diagnosis not present

## 2012-06-09 DIAGNOSIS — I2510 Atherosclerotic heart disease of native coronary artery without angina pectoris: Secondary | ICD-10-CM | POA: Diagnosis not present

## 2012-06-09 DIAGNOSIS — N058 Unspecified nephritic syndrome with other morphologic changes: Secondary | ICD-10-CM | POA: Diagnosis not present

## 2012-06-09 DIAGNOSIS — E1129 Type 2 diabetes mellitus with other diabetic kidney complication: Secondary | ICD-10-CM | POA: Diagnosis not present

## 2012-06-09 DIAGNOSIS — Z6826 Body mass index (BMI) 26.0-26.9, adult: Secondary | ICD-10-CM | POA: Diagnosis not present

## 2012-06-10 DIAGNOSIS — E782 Mixed hyperlipidemia: Secondary | ICD-10-CM | POA: Diagnosis not present

## 2012-09-20 DIAGNOSIS — E785 Hyperlipidemia, unspecified: Secondary | ICD-10-CM | POA: Diagnosis not present

## 2012-09-20 DIAGNOSIS — N058 Unspecified nephritic syndrome with other morphologic changes: Secondary | ICD-10-CM | POA: Diagnosis not present

## 2012-09-20 DIAGNOSIS — Z23 Encounter for immunization: Secondary | ICD-10-CM | POA: Diagnosis not present

## 2012-09-20 DIAGNOSIS — I2510 Atherosclerotic heart disease of native coronary artery without angina pectoris: Secondary | ICD-10-CM | POA: Diagnosis not present

## 2012-09-20 DIAGNOSIS — Z6826 Body mass index (BMI) 26.0-26.9, adult: Secondary | ICD-10-CM | POA: Diagnosis not present

## 2012-09-20 DIAGNOSIS — E1129 Type 2 diabetes mellitus with other diabetic kidney complication: Secondary | ICD-10-CM | POA: Diagnosis not present

## 2012-09-20 DIAGNOSIS — I10 Essential (primary) hypertension: Secondary | ICD-10-CM | POA: Diagnosis not present

## 2012-10-27 ENCOUNTER — Other Ambulatory Visit: Payer: Self-pay | Admitting: Dermatology

## 2012-10-27 DIAGNOSIS — B079 Viral wart, unspecified: Secondary | ICD-10-CM | POA: Diagnosis not present

## 2012-10-27 DIAGNOSIS — D485 Neoplasm of uncertain behavior of skin: Secondary | ICD-10-CM | POA: Diagnosis not present

## 2012-10-27 DIAGNOSIS — D239 Other benign neoplasm of skin, unspecified: Secondary | ICD-10-CM | POA: Diagnosis not present

## 2012-11-22 DIAGNOSIS — R972 Elevated prostate specific antigen [PSA]: Secondary | ICD-10-CM | POA: Diagnosis not present

## 2012-11-29 DIAGNOSIS — N139 Obstructive and reflux uropathy, unspecified: Secondary | ICD-10-CM | POA: Diagnosis not present

## 2012-11-29 DIAGNOSIS — N529 Male erectile dysfunction, unspecified: Secondary | ICD-10-CM | POA: Diagnosis not present

## 2012-11-29 DIAGNOSIS — R972 Elevated prostate specific antigen [PSA]: Secondary | ICD-10-CM | POA: Diagnosis not present

## 2012-12-15 ENCOUNTER — Encounter: Payer: Self-pay | Admitting: Cardiology

## 2012-12-15 DIAGNOSIS — E785 Hyperlipidemia, unspecified: Secondary | ICD-10-CM

## 2012-12-15 DIAGNOSIS — I4510 Unspecified right bundle-branch block: Secondary | ICD-10-CM

## 2012-12-15 DIAGNOSIS — E119 Type 2 diabetes mellitus without complications: Secondary | ICD-10-CM | POA: Insufficient documentation

## 2012-12-15 DIAGNOSIS — I2510 Atherosclerotic heart disease of native coronary artery without angina pectoris: Secondary | ICD-10-CM

## 2012-12-15 DIAGNOSIS — E669 Obesity, unspecified: Secondary | ICD-10-CM

## 2012-12-15 DIAGNOSIS — I25119 Atherosclerotic heart disease of native coronary artery with unspecified angina pectoris: Secondary | ICD-10-CM | POA: Insufficient documentation

## 2012-12-15 DIAGNOSIS — I10 Essential (primary) hypertension: Secondary | ICD-10-CM | POA: Insufficient documentation

## 2012-12-16 ENCOUNTER — Encounter: Payer: Self-pay | Admitting: Interventional Cardiology

## 2012-12-16 ENCOUNTER — Ambulatory Visit (INDEPENDENT_AMBULATORY_CARE_PROVIDER_SITE_OTHER): Payer: Medicare Other | Admitting: Interventional Cardiology

## 2012-12-16 VITALS — BP 110/58 | HR 70 | Ht 71.0 in | Wt 187.0 lb

## 2012-12-16 DIAGNOSIS — E119 Type 2 diabetes mellitus without complications: Secondary | ICD-10-CM

## 2012-12-16 DIAGNOSIS — I10 Essential (primary) hypertension: Secondary | ICD-10-CM

## 2012-12-16 DIAGNOSIS — E785 Hyperlipidemia, unspecified: Secondary | ICD-10-CM | POA: Diagnosis not present

## 2012-12-16 DIAGNOSIS — I2510 Atherosclerotic heart disease of native coronary artery without angina pectoris: Secondary | ICD-10-CM

## 2012-12-16 DIAGNOSIS — I4510 Unspecified right bundle-branch block: Secondary | ICD-10-CM | POA: Diagnosis not present

## 2012-12-16 NOTE — Patient Instructions (Signed)
Your physician recommends that you continue on your current medications as directed. Please refer to the Current Medication list given to you today.  Your physician wants you to follow-up in: 1 year. You will receive a reminder letter in the mail two months in advance. If you don't receive a letter, please call our office to schedule the follow-up appointment.  

## 2012-12-16 NOTE — Progress Notes (Signed)
Patient ID: Barry Zavala, male   DOB: 07-Jul-1940, 72 y.o.   MRN: [id number]    [address] [place], [place]  [address] Phone: [phone] Fax:  [phone]  Date:  12/16/2012   ID:  Barry Zavala, DOB Mar 29, 1940, MRN [id number]  PCP:  No primary provider on file.   ASSESSMENT:  1. Coronary artery disease, stable without angina 2. Hypertension, controlled 3. Hyperlipidemia, stable 4. Diabetes mellitus under great control  PLAN:  1. Clinical followup in one year 2. Active lifestyle 3. Call if chest discomfort   SUBJECTIVE: Said Barry Zavala is a 72 y.o. male who is asymptomatic. He bought shoes for me last fall and I thank him. He has not needed nitroglycerin. He is physically active. His diabetes is being controlled. No medication side effects.   Wt Readings from Last 3 Encounters:  12/16/12 187 lb (84.823 kg)     Past Medical History  Diagnosis Date  . Coronary artery disease   . Hypertension   . Hyperlipidemia   . Type 2 diabetes mellitus   . Elevated PSA   . Onychomycosis   . Anemia   . Obesity   . Irritable bowel syndrome   . Erectile dysfunction   . Right bundle branch block   . Palmar fibromatosis     Current Outpatient Prescriptions  Medication Sig Dispense Refill  . aspirin 325 MG tablet Take 325 mg by mouth daily.      . Empagliflozin (JARDIANCE) 10 MG TABS Take by mouth.      . finasteride (PROSCAR) 5 MG tablet       . glyBURIDE-metformin (GLUCOVANCE) 5-500 MG per tablet Take 2 tablets by mouth 2 (two) times daily with a meal.      . metoprolol succinate (TOPROL-XL) 50 MG 24 hr tablet Take 50 mg by mouth daily. Take with or immediately following a meal.      . Multiple Vitamins-Minerals (CENTRUM SILVER PO) Take 1 tablet by mouth daily.      . nitroGLYCERIN (NITROSTAT) 0.4 MG SL tablet Place 0.4 mg under the tongue every 5 (five) minutes as needed for chest pain.      Marland Kitchen omega-3 acid ethyl esters (LOVAZA) 1 G capsule Take 1 g by  mouth 3 (three) times daily.      . pioglitazone (ACTOS) 45 MG tablet Take 45 mg by mouth daily.      . ramipril (ALTACE) 10 MG capsule Take 10 mg by mouth daily.      . saxagliptin HCl (ONGLYZA) 2.5 MG TABS tablet Take 2.5 mg by mouth daily.       No current facility-administered medications for this visit.    Allergies:   No Known Allergies  Social History:  The patient  reports that he has quit smoking. He does not have any smokeless tobacco history on file. He reports that he does not drink alcohol or use illicit drugs.   ROS:  Please see the history of present illness.      All other systems reviewed and negative.   OBJECTIVE: VS:  BP 110/58  Pulse 70  Ht 5\' 11"  (1.803 m)  Wt 187 lb (84.823 kg)  BMI 26.09 kg/m2 Well nourished, well developed, in no acute distress HEENT: normal Neck: JVD flat. Carotid bruit 2+  Cardiac:  normal S1, S2; RRR; no murmur Lungs:  clear to auscultation bilaterally, no wheezing, rhonchi or rales Abd: soft, nontender, no hepatomegaly Ext: Edema absent. Pulses 2+ Skin: warm and dry  Neuro:  CNs 2-12 intact, no focal abnormalities noted  EKG:  Right bundle branch block, otherwise normal       Signed, Darci Needle III, MD 12/16/2012 9:27 AM  Past Medical History  Coronary artery disease, left circumflex PTCA 1995, LAD bare-metal stent 2001, PTCA to second diagonal 2001. LVEF 71% 2008   Hypertension   Hyperlipidemia   type 2 diabetes, followed Dr. Jethro Bolus, followed by Dr. Adrian Prince   elevated PSA, followed by Dr. Jethro Bolus   Onychomycosis of the toes   Borderline anemia   Obesity   Irritable bowel syndrome   Erectile dysfunction   Right bundle branch block   Palmar fibromatosis - Dr. Teressa Senter

## 2013-01-18 DIAGNOSIS — N401 Enlarged prostate with lower urinary tract symptoms: Secondary | ICD-10-CM | POA: Diagnosis not present

## 2013-01-18 DIAGNOSIS — N138 Other obstructive and reflux uropathy: Secondary | ICD-10-CM | POA: Diagnosis not present

## 2013-01-18 DIAGNOSIS — N529 Male erectile dysfunction, unspecified: Secondary | ICD-10-CM | POA: Diagnosis not present

## 2013-01-18 DIAGNOSIS — N139 Obstructive and reflux uropathy, unspecified: Secondary | ICD-10-CM | POA: Diagnosis not present

## 2013-01-18 DIAGNOSIS — R972 Elevated prostate specific antigen [PSA]: Secondary | ICD-10-CM | POA: Diagnosis not present

## 2013-01-26 DIAGNOSIS — E1129 Type 2 diabetes mellitus with other diabetic kidney complication: Secondary | ICD-10-CM | POA: Diagnosis not present

## 2013-01-26 DIAGNOSIS — N058 Unspecified nephritic syndrome with other morphologic changes: Secondary | ICD-10-CM | POA: Diagnosis not present

## 2013-01-26 DIAGNOSIS — E785 Hyperlipidemia, unspecified: Secondary | ICD-10-CM | POA: Diagnosis not present

## 2013-01-26 DIAGNOSIS — Z6826 Body mass index (BMI) 26.0-26.9, adult: Secondary | ICD-10-CM | POA: Diagnosis not present

## 2013-01-26 DIAGNOSIS — I10 Essential (primary) hypertension: Secondary | ICD-10-CM | POA: Diagnosis not present

## 2013-01-26 DIAGNOSIS — I2510 Atherosclerotic heart disease of native coronary artery without angina pectoris: Secondary | ICD-10-CM | POA: Diagnosis not present

## 2013-02-25 DIAGNOSIS — I2510 Atherosclerotic heart disease of native coronary artery without angina pectoris: Secondary | ICD-10-CM | POA: Diagnosis not present

## 2013-02-25 DIAGNOSIS — Z1331 Encounter for screening for depression: Secondary | ICD-10-CM | POA: Diagnosis not present

## 2013-02-25 DIAGNOSIS — Z0000 Encounter for general adult medical examination without abnormal findings: Secondary | ICD-10-CM | POA: Diagnosis not present

## 2013-02-25 DIAGNOSIS — E559 Vitamin D deficiency, unspecified: Secondary | ICD-10-CM | POA: Diagnosis not present

## 2013-02-25 DIAGNOSIS — N183 Chronic kidney disease, stage 3 unspecified: Secondary | ICD-10-CM | POA: Diagnosis not present

## 2013-02-25 DIAGNOSIS — IMO0001 Reserved for inherently not codable concepts without codable children: Secondary | ICD-10-CM | POA: Diagnosis not present

## 2013-02-25 DIAGNOSIS — I10 Essential (primary) hypertension: Secondary | ICD-10-CM | POA: Diagnosis not present

## 2013-02-25 DIAGNOSIS — E782 Mixed hyperlipidemia: Secondary | ICD-10-CM | POA: Diagnosis not present

## 2013-02-25 DIAGNOSIS — R109 Unspecified abdominal pain: Secondary | ICD-10-CM | POA: Diagnosis not present

## 2013-04-20 DIAGNOSIS — R109 Unspecified abdominal pain: Secondary | ICD-10-CM | POA: Diagnosis not present

## 2013-04-20 DIAGNOSIS — E782 Mixed hyperlipidemia: Secondary | ICD-10-CM | POA: Diagnosis not present

## 2013-04-20 DIAGNOSIS — I10 Essential (primary) hypertension: Secondary | ICD-10-CM | POA: Diagnosis not present

## 2013-04-20 DIAGNOSIS — I2510 Atherosclerotic heart disease of native coronary artery without angina pectoris: Secondary | ICD-10-CM | POA: Diagnosis not present

## 2013-04-20 DIAGNOSIS — I4510 Unspecified right bundle-branch block: Secondary | ICD-10-CM | POA: Diagnosis not present

## 2013-04-20 DIAGNOSIS — IMO0001 Reserved for inherently not codable concepts without codable children: Secondary | ICD-10-CM | POA: Diagnosis not present

## 2013-04-20 DIAGNOSIS — Z1331 Encounter for screening for depression: Secondary | ICD-10-CM | POA: Diagnosis not present

## 2013-04-20 DIAGNOSIS — N183 Chronic kidney disease, stage 3 unspecified: Secondary | ICD-10-CM | POA: Diagnosis not present

## 2013-05-26 DIAGNOSIS — H2510 Age-related nuclear cataract, unspecified eye: Secondary | ICD-10-CM | POA: Diagnosis not present

## 2013-05-26 DIAGNOSIS — E119 Type 2 diabetes mellitus without complications: Secondary | ICD-10-CM | POA: Diagnosis not present

## 2013-05-30 ENCOUNTER — Other Ambulatory Visit: Payer: Self-pay | Admitting: *Deleted

## 2013-05-30 DIAGNOSIS — E782 Mixed hyperlipidemia: Secondary | ICD-10-CM

## 2013-05-30 DIAGNOSIS — Z79899 Other long term (current) drug therapy: Secondary | ICD-10-CM

## 2013-06-06 DIAGNOSIS — I2510 Atherosclerotic heart disease of native coronary artery without angina pectoris: Secondary | ICD-10-CM | POA: Diagnosis not present

## 2013-06-06 DIAGNOSIS — E1129 Type 2 diabetes mellitus with other diabetic kidney complication: Secondary | ICD-10-CM | POA: Diagnosis not present

## 2013-06-06 DIAGNOSIS — I10 Essential (primary) hypertension: Secondary | ICD-10-CM | POA: Diagnosis not present

## 2013-06-06 DIAGNOSIS — E785 Hyperlipidemia, unspecified: Secondary | ICD-10-CM | POA: Diagnosis not present

## 2013-06-06 DIAGNOSIS — N058 Unspecified nephritic syndrome with other morphologic changes: Secondary | ICD-10-CM | POA: Diagnosis not present

## 2013-06-06 DIAGNOSIS — Z6826 Body mass index (BMI) 26.0-26.9, adult: Secondary | ICD-10-CM | POA: Diagnosis not present

## 2013-06-13 ENCOUNTER — Other Ambulatory Visit (INDEPENDENT_AMBULATORY_CARE_PROVIDER_SITE_OTHER): Payer: Medicare Other

## 2013-06-13 DIAGNOSIS — E782 Mixed hyperlipidemia: Secondary | ICD-10-CM

## 2013-06-13 DIAGNOSIS — Z79899 Other long term (current) drug therapy: Secondary | ICD-10-CM

## 2013-06-13 LAB — LIPID PANEL
CHOLESTEROL: 114 mg/dL (ref 0–200)
HDL: 41 mg/dL (ref >39.00)
LDL CALC: 53 mg/dL (ref 0–99)
NonHDL: 73
Total CHOL/HDL Ratio: 3
Triglycerides: 102 mg/dL (ref 0.0–149.0)
VLDL: 20.4 mg/dL (ref 0.0–40.0)

## 2013-06-13 LAB — HEPATIC FUNCTION PANEL
ALT: 23 U/L (ref 0–53)
AST: 31 U/L (ref 0–37)
Albumin: 3.6 g/dL (ref 3.5–5.2)
Alkaline Phosphatase: 45 U/L (ref 39–117)
BILIRUBIN TOTAL: 0.4 mg/dL (ref 0.2–1.2)
Bilirubin, Direct: 0.1 mg/dL (ref 0.0–0.3)
TOTAL PROTEIN: 6.6 g/dL (ref 6.0–8.3)

## 2013-06-20 ENCOUNTER — Telehealth: Payer: Self-pay

## 2013-06-20 NOTE — Telephone Encounter (Signed)
Message copied by Lamar Laundry on Mon Jun 20, 2013  8:20 AM ------      Message from: Daneen Schick      Created: Tue Jun 14, 2013  8:01 AM       Outstanding. Please repeat in 1 year. ------

## 2013-06-20 NOTE — Telephone Encounter (Signed)
pt given lab results and Dr.Smith recommendatins.Outstanding. Please repeat in 1 year..pt verbalized understanding.pt rqst copy be mailed of labs.done.

## 2013-06-22 DIAGNOSIS — M720 Palmar fascial fibromatosis [Dupuytren]: Secondary | ICD-10-CM | POA: Diagnosis not present

## 2013-09-23 DIAGNOSIS — I2510 Atherosclerotic heart disease of native coronary artery without angina pectoris: Secondary | ICD-10-CM | POA: Diagnosis not present

## 2013-09-23 DIAGNOSIS — D649 Anemia, unspecified: Secondary | ICD-10-CM | POA: Diagnosis not present

## 2013-09-23 DIAGNOSIS — E118 Type 2 diabetes mellitus with unspecified complications: Secondary | ICD-10-CM | POA: Diagnosis not present

## 2013-09-23 DIAGNOSIS — Z23 Encounter for immunization: Secondary | ICD-10-CM | POA: Diagnosis not present

## 2013-09-23 DIAGNOSIS — N058 Unspecified nephritic syndrome with other morphologic changes: Secondary | ICD-10-CM | POA: Diagnosis not present

## 2013-09-23 DIAGNOSIS — I10 Essential (primary) hypertension: Secondary | ICD-10-CM | POA: Diagnosis not present

## 2013-09-23 DIAGNOSIS — E785 Hyperlipidemia, unspecified: Secondary | ICD-10-CM | POA: Diagnosis not present

## 2013-09-23 DIAGNOSIS — Z6826 Body mass index (BMI) 26.0-26.9, adult: Secondary | ICD-10-CM | POA: Diagnosis not present

## 2013-10-19 ENCOUNTER — Telehealth: Payer: Self-pay | Admitting: Genetic Counselor

## 2013-10-19 DIAGNOSIS — Z23 Encounter for immunization: Secondary | ICD-10-CM | POA: Diagnosis not present

## 2013-10-19 NOTE — Telephone Encounter (Signed)
S/W PATIENT AND GAVE GENETIC APPT FOR 10/26 @ 11 W/KAREN POWELL,

## 2013-10-31 ENCOUNTER — Encounter: Payer: Self-pay | Admitting: Genetic Counselor

## 2013-10-31 ENCOUNTER — Other Ambulatory Visit: Payer: Medicare Other

## 2013-10-31 ENCOUNTER — Ambulatory Visit (HOSPITAL_BASED_OUTPATIENT_CLINIC_OR_DEPARTMENT_OTHER): Payer: Medicare Other | Admitting: Genetic Counselor

## 2013-10-31 DIAGNOSIS — Z315 Encounter for genetic counseling: Secondary | ICD-10-CM

## 2013-10-31 DIAGNOSIS — Z8041 Family history of malignant neoplasm of ovary: Secondary | ICD-10-CM

## 2013-10-31 DIAGNOSIS — Z808 Family history of malignant neoplasm of other organs or systems: Secondary | ICD-10-CM

## 2013-10-31 NOTE — Progress Notes (Signed)
 Dr.  R. Nevil Gates requested a consultation for genetic counseling and risk assessment for Barry Zavala, a 73 y.o. male, for discussion of his family history of ovarian cancer.  He presents to clinic today to discuss the possibility of a genetic predisposition to cancer, and to further clarify his risks, as well as his family members' risks for cancer.   HISTORY OF PRESENT ILLNESS: [name] is a 73 y.o. male with no personal history of cancer.    Past Medical History  Diagnosis Date  . Coronary artery disease   . Hypertension   . Hyperlipidemia   . Type 2 diabetes mellitus   . Elevated PSA   . Onychomycosis   . Anemia   . Obesity   . Irritable bowel syndrome   . Erectile dysfunction   . Right bundle branch block   . Palmar fibromatosis     Past Surgical History  Procedure Laterality Date  . Appendectomy    . Cardiac stents      History   Social History  . Marital Status: Married    Spouse Name: N/A    Number of Children: 2  . Years of Education: N/A   Social History Main Topics  . Smoking status: Former Smoker  . Smokeless tobacco: Not on file  . Alcohol Use: No  . Drug Use: No  . Sexual Activity: Not on file   Other Topics Concern  . Not on file   Social History Narrative  . No narrative on file    FAMILY HISTORY:  We obtained a detailed, 4-generation family history.  Significant diagnoses are listed below: Family History  Problem Relation Age of Onset  . Ovarian cancer Mother 63  . Heart attack Father   . Cancer Paternal Uncle     Karposi Sarcoma  . Multiple myeloma Sister 67  The Mr. Hendricks's mother's family was killed during the holocaust.  Mr. Escudero's father had multiple siblings and parents who were also killed in the holocaust, but several siblings survived and did not have related cancers.  Patient's maternal ancestors are of Polish descent, and paternal ancestors are of Polish descent. There is reported Ashkenazi Jewish  ancestry. There is no known consanguinity.  GENETIC COUNSELING ASSESSMENT: [name] is a 73 y.o. male with a family history of ovarian cancer and a limited family history which somewhat suggestive of a hereditary cancer syndrome and predisposition to cancer. We, therefore, discussed and recommended the following at today's visit.   DISCUSSION: We reviewed the characteristics, features and inheritance patterns of hereditary cancer syndromes. We also discussed genetic testing, including the appropriate family members to test, the process of testing, insurance coverage and turn-around-time for results. We discussed that the most common reason why someone of Ashkenazi Jewish ancestry would have ovarian cancer is the result of one of the three common BRCA mutations.  Since Mr. Haller has a limited family history, it would not be unreasonable to consider further testing.    Mr. Steely wants to perform the BRCA Founder mutation panel.  PLAN: After considering the risks, benefits, and limitations, [name] provided informed consent to pursue genetic testing and the blood sample will be sent to [facility] for analysis of the BRCA1/BRCA2 Ashkenazi Founder Mutation Panel. We discussed the implications of a positive, negative and/ or variant of uncertain significance genetic test result. Results should be available within approximately 2 weeks' time, at which point they will be disclosed by telephone to [name], as will any   additional recommendations warranted by these results. [name] will receive a summary of his genetic counseling visit and a copy of his results once available. This information will also be available in Epic. We encouraged [name] to remain in contact with cancer genetics annually so that we can continuously update the family history and inform him of any changes in cancer genetics and testing that may be of benefit for his family. [place]  Loring's questions were answered to his satisfaction today. Our contact information was provided should additional questions or concerns arise.  The patient was seen for a total of 60 minutes, greater than 50% of which was spent face-to-face counseling.  This note will also be sent to the referring provider via the electronic medical record. The patient will be supplied with a summary of this genetic counseling discussion as well as educational information on the discussed hereditary cancer syndromes following the conclusion of their visit.     _______________________________________________________________________ For Office Staff:  Number of people involved in session: 2 Was an Intern/ student involved with case: no    

## 2013-11-02 DIAGNOSIS — Z8041 Family history of malignant neoplasm of ovary: Secondary | ICD-10-CM | POA: Diagnosis not present

## 2013-11-10 ENCOUNTER — Telehealth: Payer: Self-pay | Admitting: Genetic Counselor

## 2013-11-10 ENCOUNTER — Encounter: Payer: Self-pay | Admitting: Genetic Counselor

## 2013-11-10 NOTE — Telephone Encounter (Signed)
Spoke with Kalman Shan, his wife, and revealed negative multisite BRCA testing.

## 2013-12-14 ENCOUNTER — Ambulatory Visit: Payer: Medicare Other | Admitting: Interventional Cardiology

## 2014-01-17 ENCOUNTER — Ambulatory Visit (INDEPENDENT_AMBULATORY_CARE_PROVIDER_SITE_OTHER): Payer: Medicare Other | Admitting: Interventional Cardiology

## 2014-01-17 ENCOUNTER — Encounter: Payer: Self-pay | Admitting: Interventional Cardiology

## 2014-01-17 VITALS — BP 118/62 | HR 65 | Ht 71.0 in | Wt 192.0 lb

## 2014-01-17 DIAGNOSIS — E785 Hyperlipidemia, unspecified: Secondary | ICD-10-CM | POA: Diagnosis not present

## 2014-01-17 DIAGNOSIS — I10 Essential (primary) hypertension: Secondary | ICD-10-CM | POA: Diagnosis not present

## 2014-01-17 DIAGNOSIS — I4510 Unspecified right bundle-branch block: Secondary | ICD-10-CM | POA: Diagnosis not present

## 2014-01-17 DIAGNOSIS — I2510 Atherosclerotic heart disease of native coronary artery without angina pectoris: Secondary | ICD-10-CM | POA: Diagnosis not present

## 2014-01-17 DIAGNOSIS — E1159 Type 2 diabetes mellitus with other circulatory complications: Secondary | ICD-10-CM

## 2014-01-17 NOTE — Progress Notes (Signed)
Patient ID: Barry Zavala, male   DOB: [birth year], 74 y.o.   MRN: [id number]    [address], [address] [place], [place]  [address] Phone: [phone] Fax:  [phone]  Date:  01/17/2014   ID:  Barry Zavala, DOB May 23, 1940, MRN [id number]  PCP:  Henrine Screws, MD   ASSESSMENT:  1.  Coronary artery disease, stable. Prior history of myocardial infarction treated with PCI in 1995 and was repeat PCI at 2001. Asymptomatic  2. Hyperlipidemia followed by primary care, Dr. Mertha Finders  3. Essential hypertension with good control  4. Chronic right bundle branch block without change  PLAN:  1.  Maintain an active lifestyle 2. No change in current medical regimen 3. Call if angina , dyspnea, or fatigue with activities that her typical 4.  Clinical follow-up in one year   SUBJECTIVE: Barry Zavala is a 74 y.o. male  Who is asymptomatic. He has not had palpitations, chest discomfort, dyspnea, or exertional intolerance. This no peripheral edema or difficulty with breathing. He has not needed nitroglycerin. No transient neurological symptoms. No medication side effects.   Wt Readings from Last 3 Encounters:  01/17/14 192 lb (87.091 kg)  12/16/12 187 lb (84.823 kg)     Past Medical History  Diagnosis Date  . Coronary artery disease   . Hypertension   . Hyperlipidemia   . Type 2 diabetes mellitus   . Elevated PSA   . Onychomycosis   . Anemia   . Obesity   . Irritable bowel syndrome   . Erectile dysfunction   . Right bundle branch block   . Palmar fibromatosis     Current Outpatient Prescriptions  Medication Sig Dispense Refill  . aspirin 325 MG tablet Take 325 mg by mouth daily.    . cholecalciferol (VITAMIN D) 1000 UNITS tablet Take 2,000 Units by mouth daily.    . Empagliflozin (JARDIANCE) 10 MG TABS Take by mouth.    . ezetimibe-simvastatin (VYTORIN) 10-40 MG per tablet Take 1 tablet by mouth daily.    . finasteride (PROSCAR) 5 MG tablet Take 5 mg by  mouth daily.     Marland Kitchen glyBURIDE-metformin (GLUCOVANCE) 5-500 MG per tablet Take 2 tablets by mouth 2 (two) times daily with a meal.    . metoprolol succinate (TOPROL-XL) 50 MG 24 hr tablet Take 50 mg by mouth daily. Take with or immediately following a meal.    . Multiple Vitamins-Minerals (CENTRUM SILVER PO) Take 1 tablet by mouth daily.    Marland Kitchen omega-3 acid ethyl esters (LOVAZA) 1 G capsule Take 1 g by mouth 3 (three) times daily.    . ONGLYZA 5 MG TABS tablet 5 mg daily.   2  . pioglitazone (ACTOS) 45 MG tablet Take 45 mg by mouth daily.    . ramipril (ALTACE) 10 MG capsule Take 10 mg by mouth daily.    . saxagliptin HCl (ONGLYZA) 2.5 MG TABS tablet Take 2.5 mg by mouth daily.     No current facility-administered medications for this visit.    Allergies:   No Known Allergies  Social History:  The patient  reports that he has quit smoking. He does not have any smokeless tobacco history on file. He reports that he does not drink alcohol or use illicit drugs.   ROS:  Please see the history of present illness.    Episodes of syncope or edema.   All other systems reviewed and negative.   OBJECTIVE: VS:  BP 118/62 mmHg  Pulse  65  Ht 5\' 11"  (1.803 m)  Wt 192 lb (87.091 kg)  BMI 26.79 kg/m2 Well nourished, well developed, in no acute distress,  Appears healthy HEENT: normal Neck: JVD  flat. Carotid bruit  absent  Cardiac:  normal S1, S2; RRR; no murmur Lungs:  clear to auscultation bilaterally, no wheezing, rhonchi or rales Abd: soft, nontender, no hepatomegaly Ext: Edema  absent. Pulses  2+ Skin: warm and dry Neuro:  CNs 2-12 intact, no focal abnormalities noted  EKG:   Right bundle branch block with normal sinus rhythm. Otherwise no abnormality noted. No change compared to prior       Signed, [place], MD 01/17/2014 12:34 PM

## 2014-01-17 NOTE — Patient Instructions (Signed)
Your physician recommends that you continue on your current medications as directed. Please refer to the Current Medication list given to you today. Your physician wants you to follow-up in: [place].   You will receive a reminder letter in the mail two months in advance. If you don't receive a letter, please call our office to schedule the follow-up appointment.  MAINTAIN AN ACTIVE LIFESTYLE, NO CHANGE IN YOUR PLAN OF CARE.

## 2014-01-20 DIAGNOSIS — R351 Nocturia: Secondary | ICD-10-CM | POA: Diagnosis not present

## 2014-01-20 DIAGNOSIS — R972 Elevated prostate specific antigen [PSA]: Secondary | ICD-10-CM | POA: Diagnosis not present

## 2014-01-20 DIAGNOSIS — N401 Enlarged prostate with lower urinary tract symptoms: Secondary | ICD-10-CM | POA: Diagnosis not present

## 2014-01-23 DIAGNOSIS — I2510 Atherosclerotic heart disease of native coronary artery without angina pectoris: Secondary | ICD-10-CM | POA: Diagnosis not present

## 2014-01-23 DIAGNOSIS — I10 Essential (primary) hypertension: Secondary | ICD-10-CM | POA: Diagnosis not present

## 2014-01-23 DIAGNOSIS — N08 Glomerular disorders in diseases classified elsewhere: Secondary | ICD-10-CM | POA: Diagnosis not present

## 2014-01-23 DIAGNOSIS — E118 Type 2 diabetes mellitus with unspecified complications: Secondary | ICD-10-CM | POA: Diagnosis not present

## 2014-01-23 DIAGNOSIS — Z6827 Body mass index (BMI) 27.0-27.9, adult: Secondary | ICD-10-CM | POA: Diagnosis not present

## 2014-01-23 DIAGNOSIS — D649 Anemia, unspecified: Secondary | ICD-10-CM | POA: Diagnosis not present

## 2014-01-23 DIAGNOSIS — E785 Hyperlipidemia, unspecified: Secondary | ICD-10-CM | POA: Diagnosis not present

## 2014-02-06 DIAGNOSIS — M720 Palmar fascial fibromatosis [Dupuytren]: Secondary | ICD-10-CM | POA: Diagnosis not present

## 2014-02-06 DIAGNOSIS — M65341 Trigger finger, right ring finger: Secondary | ICD-10-CM | POA: Diagnosis not present

## 2014-03-06 DIAGNOSIS — E1129 Type 2 diabetes mellitus with other diabetic kidney complication: Secondary | ICD-10-CM | POA: Diagnosis not present

## 2014-03-06 DIAGNOSIS — I450 Right fascicular block: Secondary | ICD-10-CM | POA: Diagnosis not present

## 2014-03-06 DIAGNOSIS — E559 Vitamin D deficiency, unspecified: Secondary | ICD-10-CM | POA: Diagnosis not present

## 2014-03-06 DIAGNOSIS — E119 Type 2 diabetes mellitus without complications: Secondary | ICD-10-CM | POA: Diagnosis not present

## 2014-03-06 DIAGNOSIS — Z1389 Encounter for screening for other disorder: Secondary | ICD-10-CM | POA: Diagnosis not present

## 2014-03-06 DIAGNOSIS — M65341 Trigger finger, right ring finger: Secondary | ICD-10-CM | POA: Diagnosis not present

## 2014-03-06 DIAGNOSIS — Z0001 Encounter for general adult medical examination with abnormal findings: Secondary | ICD-10-CM | POA: Diagnosis not present

## 2014-03-06 DIAGNOSIS — M720 Palmar fascial fibromatosis [Dupuytren]: Secondary | ICD-10-CM | POA: Diagnosis not present

## 2014-03-06 DIAGNOSIS — N5201 Erectile dysfunction due to arterial insufficiency: Secondary | ICD-10-CM | POA: Diagnosis not present

## 2014-03-06 DIAGNOSIS — E1165 Type 2 diabetes mellitus with hyperglycemia: Secondary | ICD-10-CM | POA: Diagnosis not present

## 2014-03-06 DIAGNOSIS — N183 Chronic kidney disease, stage 3 (moderate): Secondary | ICD-10-CM | POA: Diagnosis not present

## 2014-03-06 DIAGNOSIS — E782 Mixed hyperlipidemia: Secondary | ICD-10-CM | POA: Diagnosis not present

## 2014-03-06 DIAGNOSIS — I2510 Atherosclerotic heart disease of native coronary artery without angina pectoris: Secondary | ICD-10-CM | POA: Diagnosis not present

## 2014-03-06 DIAGNOSIS — S90122A Contusion of left lesser toe(s) without damage to nail, initial encounter: Secondary | ICD-10-CM | POA: Diagnosis not present

## 2014-03-06 DIAGNOSIS — Z79899 Other long term (current) drug therapy: Secondary | ICD-10-CM | POA: Diagnosis not present

## 2014-04-04 DIAGNOSIS — L821 Other seborrheic keratosis: Secondary | ICD-10-CM | POA: Diagnosis not present

## 2014-04-04 DIAGNOSIS — S065X0A Traumatic subdural hemorrhage without loss of consciousness, initial encounter: Secondary | ICD-10-CM | POA: Diagnosis not present

## 2014-04-24 DIAGNOSIS — M720 Palmar fascial fibromatosis [Dupuytren]: Secondary | ICD-10-CM | POA: Diagnosis not present

## 2014-04-24 DIAGNOSIS — M65341 Trigger finger, right ring finger: Secondary | ICD-10-CM | POA: Diagnosis not present

## 2014-05-29 DIAGNOSIS — E119 Type 2 diabetes mellitus without complications: Secondary | ICD-10-CM | POA: Diagnosis not present

## 2014-05-29 DIAGNOSIS — H2513 Age-related nuclear cataract, bilateral: Secondary | ICD-10-CM | POA: Diagnosis not present

## 2014-05-30 DIAGNOSIS — Z6827 Body mass index (BMI) 27.0-27.9, adult: Secondary | ICD-10-CM | POA: Diagnosis not present

## 2014-05-30 DIAGNOSIS — I2510 Atherosclerotic heart disease of native coronary artery without angina pectoris: Secondary | ICD-10-CM | POA: Diagnosis not present

## 2014-05-30 DIAGNOSIS — N08 Glomerular disorders in diseases classified elsewhere: Secondary | ICD-10-CM | POA: Diagnosis not present

## 2014-05-30 DIAGNOSIS — E1121 Type 2 diabetes mellitus with diabetic nephropathy: Secondary | ICD-10-CM | POA: Diagnosis not present

## 2014-05-30 DIAGNOSIS — E785 Hyperlipidemia, unspecified: Secondary | ICD-10-CM | POA: Diagnosis not present

## 2014-05-30 DIAGNOSIS — I10 Essential (primary) hypertension: Secondary | ICD-10-CM | POA: Diagnosis not present

## 2014-05-30 DIAGNOSIS — D649 Anemia, unspecified: Secondary | ICD-10-CM | POA: Diagnosis not present

## 2014-05-30 DIAGNOSIS — Z1389 Encounter for screening for other disorder: Secondary | ICD-10-CM | POA: Diagnosis not present

## 2014-06-06 DIAGNOSIS — L608 Other nail disorders: Secondary | ICD-10-CM | POA: Diagnosis not present

## 2014-06-06 DIAGNOSIS — B351 Tinea unguium: Secondary | ICD-10-CM | POA: Diagnosis not present

## 2014-06-20 ENCOUNTER — Telehealth: Payer: Self-pay | Admitting: Interventional Cardiology

## 2014-06-20 ENCOUNTER — Other Ambulatory Visit: Payer: Self-pay

## 2014-06-20 ENCOUNTER — Other Ambulatory Visit (INDEPENDENT_AMBULATORY_CARE_PROVIDER_SITE_OTHER): Payer: Medicare Other | Admitting: *Deleted

## 2014-06-20 DIAGNOSIS — E785 Hyperlipidemia, unspecified: Secondary | ICD-10-CM | POA: Diagnosis not present

## 2014-06-20 LAB — LIPID PANEL
CHOLESTEROL: 119 mg/dL (ref 0–200)
HDL: 37.3 mg/dL — ABNORMAL LOW (ref >39.00)
LDL Cholesterol: 59 mg/dL (ref 0–99)
NonHDL: 81.7
TRIGLYCERIDES: 112 mg/dL (ref 0.0–149.0)
Total CHOL/HDL Ratio: 3
VLDL: 22.4 mg/dL (ref 0.0–40.0)

## 2014-06-20 LAB — ALT: ALT: 18 U/L (ref 0–53)

## 2014-06-20 NOTE — Telephone Encounter (Signed)
Returned pt call. Adv pt that the correct lipid and alt labs were drawn today. I will call him with his lab results once Dr.Smith reviews them. Pt appreciative for the call back and verbalized understanding.

## 2014-06-20 NOTE — Telephone Encounter (Signed)
New message      Pt was in today for lab.  The lab tech told him there was no order in computer from Dr Tamala Julian.  Pt want to make sure labs were drawn for Dr Tamala Julian and what Dr Tamala Julian ordered.  He want to talk to Dr Thompson Caul nurse

## 2014-06-21 ENCOUNTER — Telehealth: Payer: Self-pay

## 2014-06-21 DIAGNOSIS — E785 Hyperlipidemia, unspecified: Secondary | ICD-10-CM

## 2014-06-21 NOTE — Telephone Encounter (Signed)
-----   Message from Belva Crome, MD sent at 06/20/2014  7:23 PM EDT ----- At target. Repeat 1 year

## 2014-06-21 NOTE — Telephone Encounter (Signed)
Pt aware of lab results. At target. Repeat 1 year Pt verbalized understanding. Recall and orders in Epic

## 2014-07-03 ENCOUNTER — Other Ambulatory Visit: Payer: Medicare Other

## 2014-07-05 DIAGNOSIS — M65351 Trigger finger, right little finger: Secondary | ICD-10-CM | POA: Diagnosis not present

## 2014-08-16 DIAGNOSIS — M65351 Trigger finger, right little finger: Secondary | ICD-10-CM | POA: Diagnosis not present

## 2014-10-02 DIAGNOSIS — I10 Essential (primary) hypertension: Secondary | ICD-10-CM | POA: Diagnosis not present

## 2014-10-02 DIAGNOSIS — Z6826 Body mass index (BMI) 26.0-26.9, adult: Secondary | ICD-10-CM | POA: Diagnosis not present

## 2014-10-02 DIAGNOSIS — I2510 Atherosclerotic heart disease of native coronary artery without angina pectoris: Secondary | ICD-10-CM | POA: Diagnosis not present

## 2014-10-02 DIAGNOSIS — Z23 Encounter for immunization: Secondary | ICD-10-CM | POA: Diagnosis not present

## 2014-10-02 DIAGNOSIS — E1129 Type 2 diabetes mellitus with other diabetic kidney complication: Secondary | ICD-10-CM | POA: Diagnosis not present

## 2014-10-02 DIAGNOSIS — D649 Anemia, unspecified: Secondary | ICD-10-CM | POA: Diagnosis not present

## 2014-10-02 DIAGNOSIS — E785 Hyperlipidemia, unspecified: Secondary | ICD-10-CM | POA: Diagnosis not present

## 2014-10-02 DIAGNOSIS — E118 Type 2 diabetes mellitus with unspecified complications: Secondary | ICD-10-CM | POA: Diagnosis not present

## 2014-10-02 DIAGNOSIS — N08 Glomerular disorders in diseases classified elsewhere: Secondary | ICD-10-CM | POA: Diagnosis not present

## 2014-10-16 DIAGNOSIS — M65341 Trigger finger, right ring finger: Secondary | ICD-10-CM | POA: Diagnosis not present

## 2014-10-16 DIAGNOSIS — M65351 Trigger finger, right little finger: Secondary | ICD-10-CM | POA: Diagnosis not present

## 2015-01-24 ENCOUNTER — Other Ambulatory Visit (INDEPENDENT_AMBULATORY_CARE_PROVIDER_SITE_OTHER): Payer: Medicare Other | Admitting: *Deleted

## 2015-01-24 DIAGNOSIS — E785 Hyperlipidemia, unspecified: Secondary | ICD-10-CM | POA: Diagnosis not present

## 2015-01-24 LAB — LIPID PANEL
Cholesterol: 83 mg/dL — ABNORMAL LOW (ref 125–200)
HDL: 38 mg/dL — AB (ref >=40)
LDL CALC: 35 mg/dL (ref <130)
TRIGLYCERIDES: 49 mg/dL (ref <150)
Total CHOL/HDL Ratio: 2.2 Ratio (ref <=5.0)
VLDL: 10 mg/dL (ref <30)

## 2015-01-24 LAB — ALT: ALT: 18 U/L (ref 9–46)

## 2015-01-25 DIAGNOSIS — H2513 Age-related nuclear cataract, bilateral: Secondary | ICD-10-CM | POA: Diagnosis not present

## 2015-01-26 ENCOUNTER — Telehealth: Payer: Self-pay

## 2015-01-26 DIAGNOSIS — Z79899 Other long term (current) drug therapy: Secondary | ICD-10-CM

## 2015-01-26 MED ORDER — EZETIMIBE-SIMVASTATIN 10-20 MG PO TABS: 1.0000 | ORAL_TABLET | Freq: Every day | ORAL | Status: DC

## 2015-01-26 NOTE — Telephone Encounter (Signed)
Called patient about results. Patient is seeing Dr. Tamala Julian next week and will schedule lab work at that time. Will send in new prescription to patient's pharmacy.

## 2015-01-26 NOTE — Telephone Encounter (Signed)
-----   Message from Belva Crome, MD sent at 01/26/2015 10:57 AM EST ----- Change Vytorin to 10/20 mg daily. Liver and lipid in 6-8 weeks. Levels are better than they need.

## 2015-01-30 DIAGNOSIS — I10 Essential (primary) hypertension: Secondary | ICD-10-CM | POA: Diagnosis not present

## 2015-01-30 DIAGNOSIS — I2510 Atherosclerotic heart disease of native coronary artery without angina pectoris: Secondary | ICD-10-CM | POA: Diagnosis not present

## 2015-01-30 DIAGNOSIS — E1129 Type 2 diabetes mellitus with other diabetic kidney complication: Secondary | ICD-10-CM | POA: Diagnosis not present

## 2015-01-30 DIAGNOSIS — D6489 Other specified anemias: Secondary | ICD-10-CM | POA: Diagnosis not present

## 2015-01-30 DIAGNOSIS — Z6826 Body mass index (BMI) 26.0-26.9, adult: Secondary | ICD-10-CM | POA: Diagnosis not present

## 2015-01-30 DIAGNOSIS — E784 Other hyperlipidemia: Secondary | ICD-10-CM | POA: Diagnosis not present

## 2015-01-30 DIAGNOSIS — N08 Glomerular disorders in diseases classified elsewhere: Secondary | ICD-10-CM | POA: Diagnosis not present

## 2015-01-31 ENCOUNTER — Ambulatory Visit (INDEPENDENT_AMBULATORY_CARE_PROVIDER_SITE_OTHER): Payer: Medicare Other | Admitting: Interventional Cardiology

## 2015-01-31 ENCOUNTER — Encounter: Payer: Self-pay | Admitting: Interventional Cardiology

## 2015-01-31 VITALS — BP 114/56 | HR 66 | Ht 72.0 in | Wt 190.8 lb

## 2015-01-31 DIAGNOSIS — I4510 Unspecified right bundle-branch block: Secondary | ICD-10-CM | POA: Diagnosis not present

## 2015-01-31 DIAGNOSIS — E1159 Type 2 diabetes mellitus with other circulatory complications: Secondary | ICD-10-CM

## 2015-01-31 DIAGNOSIS — R0989 Other specified symptoms and signs involving the circulatory and respiratory systems: Secondary | ICD-10-CM | POA: Insufficient documentation

## 2015-01-31 DIAGNOSIS — I2510 Atherosclerotic heart disease of native coronary artery without angina pectoris: Secondary | ICD-10-CM | POA: Diagnosis not present

## 2015-01-31 DIAGNOSIS — E785 Hyperlipidemia, unspecified: Secondary | ICD-10-CM

## 2015-01-31 DIAGNOSIS — R011 Cardiac murmur, unspecified: Secondary | ICD-10-CM

## 2015-01-31 DIAGNOSIS — I10 Essential (primary) hypertension: Secondary | ICD-10-CM

## 2015-01-31 DIAGNOSIS — Z79899 Other long term (current) drug therapy: Secondary | ICD-10-CM

## 2015-01-31 NOTE — Patient Instructions (Signed)
Medication Instructions:  Your physician recommends that you continue on your current medications as directed. Please refer to the Current Medication list given to you today.   Labwork: None ordered  Testing/Procedures: Your physician has requested that you have a carotid duplex. This test is an ultrasound of the carotid arteries in your neck. It looks at blood flow through these arteries that supply the brain with blood. Allow one hour for this exam. There are no restrictions or special instructions.  Your physician has requested that you have en exercise stress myoview. For further information please visit [email or web address]. Please follow instruction sheet, as given.   Follow-Up: Your physician wants you to follow-up in: 1 year with Dr.Smith You will receive a reminder letter in the mail two months in advance. If you don't receive a letter, please call our office to schedule the follow-up appointment.   Any Other Special Instructions Will Be Listed Below (If Applicable).     If you need a refill on your cardiac medications before your next appointment, please call your pharmacy.

## 2015-01-31 NOTE — Progress Notes (Signed)
Cardiology Office Note   Date:  01/31/2015   ID:  Barry Zavala, DOB 13-Dec-1940, MRN [id number]  PCP:  Henrine Screws, MD  Cardiologist:  Sinclair Grooms, MD   Chief Complaint  Patient presents with  . Coronary Artery Disease      History of Present Illness: Barry Zavala is a 75 y.o. male who presents for CAD, prior LAD acute coronary syndrome resulting in PCI without stent, diabetes mellitus type 2, hyperlipidemia, and essential hypertension.  Goodwin is asymptomatic with reference to anginal complaints. He denies dyspnea and fluid retention. He has no difficulty lying flat. He has not had palpitations or syncope. He remains very active. He does suffer erectile dysfunction.    Past Medical History  Diagnosis Date  . Coronary artery disease   . Hypertension   . Hyperlipidemia   . Type 2 diabetes mellitus ([place])   . Elevated PSA   . Onychomycosis   . Anemia   . Obesity   . Irritable bowel syndrome   . Erectile dysfunction   . Right bundle branch block   . Palmar fibromatosis     Past Surgical History  Procedure Laterality Date  . Appendectomy    . Cardiac stents       Current Outpatient Prescriptions  Medication Sig Dispense Refill  . aspirin 325 MG tablet Take 325 mg by mouth daily.    . cholecalciferol (VITAMIN D) 1000 UNITS tablet Take 2,000 Units by mouth daily.    . Empagliflozin (JARDIANCE) 10 MG TABS Take by mouth.    . glyBURIDE-metformin (GLUCOVANCE) 5-500 MG per tablet Take 2 tablets by mouth 2 (two) times daily with a meal.    . metoprolol succinate (TOPROL-XL) 50 MG 24 hr tablet Take 50 mg by mouth daily. Take with or immediately following a meal.    . Multiple Vitamins-Minerals (CENTRUM SILVER PO) Take 1 tablet by mouth daily.    Marland Kitchen omega-3 acid ethyl esters (LOVAZA) 1 G capsule Take 1 g by mouth 3 (three) times daily.    . ONGLYZA 5 MG TABS tablet 5 mg daily.   2  . pioglitazone (ACTOS) 45 MG tablet Take 45 mg by mouth daily.    .  ramipril (ALTACE) 10 MG capsule Take 10 mg by mouth daily.    Marland Kitchen VYTORIN 10-40 MG tablet Take 1 tablet by mouth daily.  1   No current facility-administered medications for this visit.    Allergies:   Review of patient's allergies indicates no known allergies.    Social History:  The patient  reports that he has quit smoking. He has never used smokeless tobacco. He reports that he does not drink alcohol or use illicit drugs.   Family History:  The patient's family history includes Cancer in his paternal uncle; Heart attack in his father; Multiple myeloma (age of onset: 19) in his sister; Ovarian cancer (age of onset: 37) in his mother.    ROS:  Please see the history of present illness.   Otherwise, review of systems are positive for none.   All other systems are reviewed and negative.    PHYSICAL EXAM: VS:  BP 114/56 mmHg  Pulse 66  Ht 6' (1.829 m)  Wt 190 lb 12.8 oz (86.546 kg)  BMI 25.87 kg/m2 , BMI Body mass index is 25.87 kg/(m^2). GEN: Well nourished, well developed, in no acute distress HEENT: normal Neck: no JVD, but there is a carotid bruit. No masses are palpated Cardiac: RRR.  There is  a 1-2/6 right upper sternal border crescendo decrescendo systolic murmur. There is no rub, or gallop. There is no edema. Respiratory:  clear to auscultation bilaterally, normal work of breathing. GI: soft, nontender, nondistended, + BS MS: no deformity or atrophy Skin: warm and dry, no rash Neuro:  Strength and sensation are intact Psych: euthymic mood, full affect   EKG:  EKG is ordered today. The ekg reveals nsr with RBBB unchanged   Recent Labs: 01/24/2015: ALT 18    Lipid Panel    Component Value Date/Time   CHOL 83* 01/24/2015 0949   TRIG 49 01/24/2015 0949   HDL 38* 01/24/2015 0949   CHOLHDL 2.2 01/24/2015 0949   VLDL 10 01/24/2015 0949   LDLCALC 35 01/24/2015 0949      Wt Readings from Last 3 Encounters:  01/31/15 190 lb 12.8 oz (86.546 kg)  01/17/14 192 lb  (87.091 kg)  12/16/12 187 lb (84.823 kg)      Other studies Reviewed: Additional studies/ records that were reviewed today include: Old records. The findings include no recent stress data or carotid imaging..    ASSESSMENT AND PLAN:  1. Coronary artery disease involving native coronary artery of native heart without angina pectoris Asymptomatic  2. Hyperlipidemia Extremely good blood work with opportunity to decrease intensity of therapy  3. Essential hypertension Controlled  4. Right bundle branch block Unchanged  5. Type 2 diabetes mellitus with other circulatory complications ([place]) Managed by primary care  6. Left carotid bruit Could be transmitted from the aortic valve  8. 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  The patient has the following concerns regarding medicines: Will continue Vytorin 40/10 mg until he gives out of the current supply and then decreased 20/10 mg..  The following changes/actions have been instituted:    Bilateral carotid Doppler study  Stress Cardiolite prior to next office visit  Present echocardiogram as we followed the systolic murmur as he probably has developing aortic stenosis.  Lipid levels were excessively low when recently checked and will need decrease intensity therapy.  Labs/ tests ordered today include:   Orders Placed This Encounter  Procedures  . Myocardial Perfusion Imaging  . EKG 12-Lead     Disposition:   FU with HS in 1 year  Signed, Sinclair Grooms, MD  01/31/2015 9:11 AM    [facility] [address], [place], [place]  [address] Phone: [phone]; Fax: [phone]

## 2015-02-02 ENCOUNTER — Ambulatory Visit (HOSPITAL_COMMUNITY)
Admission: RE | Admit: 2015-02-02 | Discharge: 2015-02-02 | Disposition: A | Discharge status: Home / Self Care | Payer: Medicare Other | Source: Ambulatory Visit | Attending: Cardiovascular Disease | Admitting: Cardiovascular Disease

## 2015-02-02 DIAGNOSIS — I10 Essential (primary) hypertension: Secondary | ICD-10-CM | POA: Diagnosis not present

## 2015-02-02 DIAGNOSIS — I6523 Occlusion and stenosis of bilateral carotid arteries: Secondary | ICD-10-CM | POA: Diagnosis not present

## 2015-02-02 DIAGNOSIS — R0989 Other specified symptoms and signs involving the circulatory and respiratory systems: Secondary | ICD-10-CM | POA: Insufficient documentation

## 2015-02-02 DIAGNOSIS — E785 Hyperlipidemia, unspecified: Secondary | ICD-10-CM | POA: Diagnosis not present

## 2015-02-02 DIAGNOSIS — E119 Type 2 diabetes mellitus without complications: Secondary | ICD-10-CM | POA: Insufficient documentation

## 2015-02-06 ENCOUNTER — Telehealth (HOSPITAL_COMMUNITY): Payer: Self-pay | Admitting: *Deleted

## 2015-02-06 DIAGNOSIS — R972 Elevated prostate specific antigen [PSA]: Secondary | ICD-10-CM | POA: Diagnosis not present

## 2015-02-06 DIAGNOSIS — R351 Nocturia: Secondary | ICD-10-CM | POA: Diagnosis not present

## 2015-02-06 DIAGNOSIS — N401 Enlarged prostate with lower urinary tract symptoms: Secondary | ICD-10-CM | POA: Diagnosis not present

## 2015-02-06 DIAGNOSIS — N5201 Erectile dysfunction due to arterial insufficiency: Secondary | ICD-10-CM | POA: Diagnosis not present

## 2015-02-06 DIAGNOSIS — Z0000 Encounter for general adult medical examination without abnormal findings: Secondary | ICD-10-CM | POA: Diagnosis not present

## 2015-02-06 DIAGNOSIS — N138 Other obstructive and reflux uropathy: Secondary | ICD-10-CM | POA: Diagnosis not present

## 2015-02-06 NOTE — Telephone Encounter (Signed)
Patient given detailed instructions per Myocardial Perfusion Study Information Sheet for the test on 02/08/15 at 7:15. Patient notified to arrive 15 minutes early and that it is imperative to arrive on time for appointment to keep from having the test rescheduled.  If you need to cancel or reschedule your appointment, please call the office within 24 hours of your appointment. Failure to do so may result in a cancellation of your appointment, and a $50 no show fee. Patient verbalized understanding.Barry Zavala

## 2015-02-08 ENCOUNTER — Ambulatory Visit (HOSPITAL_COMMUNITY): Payer: Medicare Other | Attending: Cardiovascular Disease

## 2015-02-08 DIAGNOSIS — R9439 Abnormal result of other cardiovascular function study: Secondary | ICD-10-CM | POA: Diagnosis not present

## 2015-02-08 DIAGNOSIS — I2510 Atherosclerotic heart disease of native coronary artery without angina pectoris: Secondary | ICD-10-CM

## 2015-02-08 DIAGNOSIS — I779 Disorder of arteries and arterioles, unspecified: Secondary | ICD-10-CM | POA: Insufficient documentation

## 2015-02-08 DIAGNOSIS — E119 Type 2 diabetes mellitus without complications: Secondary | ICD-10-CM | POA: Diagnosis not present

## 2015-02-08 DIAGNOSIS — I10 Essential (primary) hypertension: Secondary | ICD-10-CM | POA: Insufficient documentation

## 2015-02-08 LAB — MYOCARDIAL PERFUSION IMAGING
CHL CUP MPHR: 145 {beats}/min
CHL CUP NUCLEAR SRS: 5
CHL CUP RESTING HR STRESS: 70 {beats}/min
CSEPEDS: 0 s
CSEPEW: 7 METS
CSEPHR: 90 %
Exercise duration (min): 6 min
LHR: 0.29
LV sys vol: 35 mL
LVDIAVOL: 86 mL
NUC STRESS TID: 1.1
Peak HR: 131 {beats}/min
SDS: 0
SSS: 5

## 2015-02-08 MED ORDER — TECHNETIUM TC 99M SESTAMIBI GENERIC - CARDIOLITE
32.8000 | Freq: Once | INTRAVENOUS | Status: AC | PRN
  Administered 2015-02-08: 32.8 via INTRAVENOUS

## 2015-02-08 MED ORDER — TECHNETIUM TC 99M SESTAMIBI GENERIC - CARDIOLITE
10.9000 | Freq: Once | INTRAVENOUS | Status: AC | PRN
  Administered 2015-02-08: 10.9 via INTRAVENOUS

## 2015-04-03 DIAGNOSIS — N5201 Erectile dysfunction due to arterial insufficiency: Secondary | ICD-10-CM | POA: Diagnosis not present

## 2015-04-03 DIAGNOSIS — I2510 Atherosclerotic heart disease of native coronary artery without angina pectoris: Secondary | ICD-10-CM | POA: Diagnosis not present

## 2015-04-03 DIAGNOSIS — E559 Vitamin D deficiency, unspecified: Secondary | ICD-10-CM | POA: Diagnosis not present

## 2015-04-03 DIAGNOSIS — Z0001 Encounter for general adult medical examination with abnormal findings: Secondary | ICD-10-CM | POA: Diagnosis not present

## 2015-04-03 DIAGNOSIS — Z1389 Encounter for screening for other disorder: Secondary | ICD-10-CM | POA: Diagnosis not present

## 2015-04-03 DIAGNOSIS — E119 Type 2 diabetes mellitus without complications: Secondary | ICD-10-CM | POA: Diagnosis not present

## 2015-04-03 DIAGNOSIS — Z7984 Long term (current) use of oral hypoglycemic drugs: Secondary | ICD-10-CM | POA: Diagnosis not present

## 2015-04-03 DIAGNOSIS — I450 Right fascicular block: Secondary | ICD-10-CM | POA: Diagnosis not present

## 2015-04-03 DIAGNOSIS — M5432 Sciatica, left side: Secondary | ICD-10-CM | POA: Diagnosis not present

## 2015-04-03 DIAGNOSIS — N183 Chronic kidney disease, stage 3 (moderate): Secondary | ICD-10-CM | POA: Diagnosis not present

## 2015-04-03 DIAGNOSIS — Z79899 Other long term (current) drug therapy: Secondary | ICD-10-CM | POA: Diagnosis not present

## 2015-04-03 DIAGNOSIS — E782 Mixed hyperlipidemia: Secondary | ICD-10-CM | POA: Diagnosis not present

## 2015-04-03 DIAGNOSIS — E1165 Type 2 diabetes mellitus with hyperglycemia: Secondary | ICD-10-CM | POA: Diagnosis not present

## 2015-05-29 DIAGNOSIS — Z1389 Encounter for screening for other disorder: Secondary | ICD-10-CM | POA: Diagnosis not present

## 2015-05-29 DIAGNOSIS — I10 Essential (primary) hypertension: Secondary | ICD-10-CM | POA: Diagnosis not present

## 2015-05-29 DIAGNOSIS — I2510 Atherosclerotic heart disease of native coronary artery without angina pectoris: Secondary | ICD-10-CM | POA: Diagnosis not present

## 2015-05-29 DIAGNOSIS — D6489 Other specified anemias: Secondary | ICD-10-CM | POA: Diagnosis not present

## 2015-05-29 DIAGNOSIS — Z6826 Body mass index (BMI) 26.0-26.9, adult: Secondary | ICD-10-CM | POA: Diagnosis not present

## 2015-05-29 DIAGNOSIS — E784 Other hyperlipidemia: Secondary | ICD-10-CM | POA: Diagnosis not present

## 2015-05-29 DIAGNOSIS — I779 Disorder of arteries and arterioles, unspecified: Secondary | ICD-10-CM | POA: Diagnosis not present

## 2015-05-29 DIAGNOSIS — E1121 Type 2 diabetes mellitus with diabetic nephropathy: Secondary | ICD-10-CM | POA: Diagnosis not present

## 2015-05-29 DIAGNOSIS — N08 Glomerular disorders in diseases classified elsewhere: Secondary | ICD-10-CM | POA: Diagnosis not present

## 2015-06-26 DIAGNOSIS — Z1211 Encounter for screening for malignant neoplasm of colon: Secondary | ICD-10-CM | POA: Diagnosis not present

## 2015-06-26 DIAGNOSIS — Z1212 Encounter for screening for malignant neoplasm of rectum: Secondary | ICD-10-CM | POA: Diagnosis not present

## 2015-07-18 DIAGNOSIS — D239 Other benign neoplasm of skin, unspecified: Secondary | ICD-10-CM | POA: Diagnosis not present

## 2015-07-18 DIAGNOSIS — L821 Other seborrheic keratosis: Secondary | ICD-10-CM | POA: Diagnosis not present

## 2015-07-18 DIAGNOSIS — L570 Actinic keratosis: Secondary | ICD-10-CM | POA: Diagnosis not present

## 2015-08-16 DIAGNOSIS — Z029 Encounter for administrative examinations, unspecified: Secondary | ICD-10-CM | POA: Diagnosis not present

## 2015-08-28 DIAGNOSIS — R972 Elevated prostate specific antigen [PSA]: Secondary | ICD-10-CM | POA: Diagnosis not present

## 2015-08-28 DIAGNOSIS — N5201 Erectile dysfunction due to arterial insufficiency: Secondary | ICD-10-CM | POA: Diagnosis not present

## 2015-08-28 DIAGNOSIS — R351 Nocturia: Secondary | ICD-10-CM | POA: Diagnosis not present

## 2015-08-28 DIAGNOSIS — N401 Enlarged prostate with lower urinary tract symptoms: Secondary | ICD-10-CM | POA: Diagnosis not present

## 2015-08-28 DIAGNOSIS — R81 Glycosuria: Secondary | ICD-10-CM | POA: Diagnosis not present

## 2015-09-06 DIAGNOSIS — Z23 Encounter for immunization: Secondary | ICD-10-CM | POA: Diagnosis not present

## 2015-09-06 DIAGNOSIS — D6489 Other specified anemias: Secondary | ICD-10-CM | POA: Diagnosis not present

## 2015-09-06 DIAGNOSIS — E1121 Type 2 diabetes mellitus with diabetic nephropathy: Secondary | ICD-10-CM | POA: Diagnosis not present

## 2015-09-06 DIAGNOSIS — I2510 Atherosclerotic heart disease of native coronary artery without angina pectoris: Secondary | ICD-10-CM | POA: Diagnosis not present

## 2015-09-06 DIAGNOSIS — N08 Glomerular disorders in diseases classified elsewhere: Secondary | ICD-10-CM | POA: Diagnosis not present

## 2015-09-06 DIAGNOSIS — I10 Essential (primary) hypertension: Secondary | ICD-10-CM | POA: Diagnosis not present

## 2015-09-06 DIAGNOSIS — E784 Other hyperlipidemia: Secondary | ICD-10-CM | POA: Diagnosis not present

## 2015-09-06 DIAGNOSIS — I7789 Other specified disorders of arteries and arterioles: Secondary | ICD-10-CM | POA: Diagnosis not present

## 2015-09-06 DIAGNOSIS — Z6826 Body mass index (BMI) 26.0-26.9, adult: Secondary | ICD-10-CM | POA: Diagnosis not present

## 2015-09-07 ENCOUNTER — Other Ambulatory Visit: Payer: Self-pay | Admitting: *Deleted

## 2015-09-07 MED ORDER — VYTORIN 10-40 MG PO TABS: 1.0000 | ORAL_TABLET | Freq: Every day | ORAL | 1 refills | Status: DC

## 2015-09-17 ENCOUNTER — Other Ambulatory Visit: Payer: Self-pay | Admitting: Interventional Cardiology

## 2015-09-17 ENCOUNTER — Other Ambulatory Visit: Payer: Self-pay

## 2015-09-17 MED ORDER — VYTORIN 10-40 MG PO TABS: 1.0000 | ORAL_TABLET | Freq: Every day | ORAL | 3 refills | Status: DC

## 2015-09-17 MED ORDER — EZETIMIBE-SIMVASTATIN 10-20 MG PO TABS: 1.0000 | ORAL_TABLET | Freq: Every day | ORAL | 1 refills | Status: DC

## 2015-11-19 DIAGNOSIS — M1812 Unilateral primary osteoarthritis of first carpometacarpal joint, left hand: Secondary | ICD-10-CM | POA: Diagnosis not present

## 2015-11-19 DIAGNOSIS — M79645 Pain in left finger(s): Secondary | ICD-10-CM | POA: Diagnosis not present

## 2015-11-19 DIAGNOSIS — M47812 Spondylosis without myelopathy or radiculopathy, cervical region: Secondary | ICD-10-CM | POA: Diagnosis not present

## 2015-12-05 DIAGNOSIS — M6250 Muscle wasting and atrophy, not elsewhere classified, unspecified site: Secondary | ICD-10-CM | POA: Diagnosis not present

## 2015-12-05 DIAGNOSIS — R05 Cough: Secondary | ICD-10-CM | POA: Diagnosis not present

## 2015-12-05 DIAGNOSIS — J069 Acute upper respiratory infection, unspecified: Secondary | ICD-10-CM | POA: Diagnosis not present

## 2015-12-14 ENCOUNTER — Ambulatory Visit (INDEPENDENT_AMBULATORY_CARE_PROVIDER_SITE_OTHER): Payer: Medicare Other | Admitting: Orthopaedic Surgery

## 2015-12-14 ENCOUNTER — Encounter (INDEPENDENT_AMBULATORY_CARE_PROVIDER_SITE_OTHER): Payer: Self-pay | Admitting: Orthopaedic Surgery

## 2015-12-14 VITALS — BP 119/66 | HR 73 | Ht 71.0 in | Wt 183.0 lb

## 2015-12-14 DIAGNOSIS — I2510 Atherosclerotic heart disease of native coronary artery without angina pectoris: Secondary | ICD-10-CM

## 2015-12-14 DIAGNOSIS — M4722 Other spondylosis with radiculopathy, cervical region: Secondary | ICD-10-CM | POA: Diagnosis not present

## 2015-12-14 NOTE — Progress Notes (Signed)
Office Visit Note   Patient: Barry Zavala           Date of Birth: 1940/07/27           MRN: [id number] Visit Date: 12/14/2015              Requested by: Daryll Brod, MD [address] [place], [place] [address] PCP: Henrine Screws, MD   Assessment & Plan: Visit Diagnoses:  1. Osteoarthritis of spine with radiculopathy, cervical region   2.      Lateral intrinsic atrophy of the hands  Plan: Presently not having neck pain he has no problems going up and down stairs using play golf. He has a significant intrinsic atrophy and likely has some cervical compression of but currently doesn't bother him and anything he does. If he develops any lower extremity weakness or increase upper extremity weakness he can let us know we can proceed with cervical MRI. We reviewed the x-rays discussed the cervical MRI imaging possibility of findings possible treatment options.  Follow-Up Instructions: Return if symptoms worsen or fail to improve.   Orders:  No orders of the defined types were placed in this encounter.  No orders of the defined types were placed in this encounter.     Procedures: No procedures performed   Clinical Data: No additional findings.   Subjective: Chief Complaint  Patient presents with  . Left Forearm - Pain    [name] is here for cervical spondylosis without myelopathy. Patient states that he only has dull ache in the left forearm.  He states that he does not have any neck pain at all.     Review of Systems  Constitutional: Negative for chills and diaphoresis.  HENT: Negative for ear discharge, ear pain and nosebleeds.   Eyes: Negative for discharge and visual disturbance.  Respiratory: Negative for cough, choking and shortness of breath.   Cardiovascular: Negative for chest pain and palpitations.       Coronary disease  Gastrointestinal: Negative for abdominal distention and abdominal pain.  Endocrine: Negative for cold intolerance and heat  intolerance.       Positive for diabetes since mid 1990s.  Genitourinary: Negative for flank pain and hematuria.  Musculoskeletal:       The patient had some right forearm pain from the elbow to the wrist. Some pain at the base of the left thumb. He still low plays golf without problems.  Skin: Negative for rash and wound.  Neurological: Negative for seizures and speech difficulty.  Hematological: Negative for adenopathy. Does not bruise/bleed easily.  Psychiatric/Behavioral: Negative for agitation and suicidal ideas.     Objective: Vital Signs: BP 119/66 (BP Location: Left Arm, Patient Position: Sitting)   Pulse 73   Ht _0  (1.803 m)   Wt 183 lb (83 kg)   BMI 25.52 kg/m   Physical Exam  Constitutional: He is oriented to person, place, and time. He appears well-developed and well-nourished.  HENT:  Head: Normocephalic and atraumatic.  Eyes: EOM are normal. Pupils are equal, round, and reactive to light.  Neck: No tracheal deviation present. No thyromegaly present.  Cardiovascular: Normal rate.   Pulmonary/Chest: Effort normal. He has no wheezes.  Abdominal: Soft. Bowel sounds are normal.  Musculoskeletal:  No pain with the cervical compression or distraction of brachioplexus tenderness good cervical range of motion without discomfort. No lower extremity hyperreflexia.  Neurological: He is alert and oriented to person, place, and time.  Skin: Skin is warm and dry. Capillary  refill takes less than 2 seconds.  Psychiatric: He has a normal mood and affect. His behavior is normal. Judgment and thought content normal.    Ortho Exam patient has no tenderness to percussion over the ulnar nerve show right and left. He has a significant intrinsic weakness with the dorsal and volar interossei atrophy and weakness on manual testing. Mr. Dara Lords comes of surgery some difficulty right hand flexing the ring and small finger toward the palm medical history extension is performed I have good  strength. Biceps triceps are strong 1+ reflexes  Specialty Comments:  No specialty comments available.  Imaging: No results found. Outside x-rays are reviewed on the paper that were brought from Dr. Levell July office. This shows cervical spondylosis 2 mm of the anterolisthesis with involvement C4-5 C5-6 with the spurring and foraminal narrowing multiple levels.  PMFS History: Patient Active Problem List   Diagnosis Date Noted  . Heart murmur, systolic 42/68/3419  . Left carotid bruit 01/31/2015  . Hypertension   . Hyperlipidemia   . Type 2 diabetes mellitus ([place])   . Right bundle branch block   . Coronary artery disease   . Obesity    Past Medical History:  Diagnosis Date  . Anemia   . Coronary artery disease   . Elevated PSA   . Erectile dysfunction   . Hyperlipidemia   . Hypertension   . Irritable bowel syndrome   . Obesity   . Onychomycosis   . Palmar fibromatosis   . Right bundle branch block   . Type 2 diabetes mellitus ([facility])     Family History  Problem Relation Age of Onset  . Ovarian cancer Mother 66  . Heart attack Father   . Cancer Paternal Uncle     Karposi Sarcoma  . Multiple myeloma Sister 16    Past Surgical History:  Procedure Laterality Date  . APPENDECTOMY    . cardiac stents     Social History   Occupational History  . Not on file.   Social History Main Topics  . Smoking status: Former Research scientist (life sciences)  . Smokeless tobacco: Never Used  . Alcohol use No  . Drug use: No  . Sexual activity: Not on file

## 2015-12-17 ENCOUNTER — Other Ambulatory Visit: Payer: Self-pay | Admitting: Internal Medicine

## 2015-12-17 ENCOUNTER — Ambulatory Visit
Admission: RE | Admit: 2015-12-17 | Discharge: 2015-12-17 | Disposition: A | Discharge status: Home / Self Care | Payer: Medicare Other | Source: Ambulatory Visit | Attending: Internal Medicine | Admitting: Internal Medicine

## 2015-12-17 DIAGNOSIS — R05 Cough: Secondary | ICD-10-CM

## 2015-12-17 DIAGNOSIS — R058 Other specified cough: Secondary | ICD-10-CM

## 2015-12-18 ENCOUNTER — Ambulatory Visit (INDEPENDENT_AMBULATORY_CARE_PROVIDER_SITE_OTHER): Payer: Self-pay | Admitting: Orthopaedic Surgery

## 2015-12-27 DIAGNOSIS — E119 Type 2 diabetes mellitus without complications: Secondary | ICD-10-CM | POA: Diagnosis not present

## 2015-12-27 DIAGNOSIS — R748 Abnormal levels of other serum enzymes: Secondary | ICD-10-CM | POA: Insufficient documentation

## 2015-12-27 DIAGNOSIS — M62541 Muscle wasting and atrophy, not elsewhere classified, right hand: Secondary | ICD-10-CM | POA: Insufficient documentation

## 2015-12-27 DIAGNOSIS — N183 Chronic kidney disease, stage 3 unspecified: Secondary | ICD-10-CM | POA: Insufficient documentation

## 2015-12-27 DIAGNOSIS — H6983 Other specified disorders of Eustachian tube, bilateral: Secondary | ICD-10-CM | POA: Diagnosis not present

## 2015-12-27 DIAGNOSIS — H903 Sensorineural hearing loss, bilateral: Secondary | ICD-10-CM | POA: Diagnosis not present

## 2015-12-27 NOTE — Progress Notes (Signed)
Office Visit Note  Patient: Barry Zavala             Date of Birth: 06-Jul-1940           MRN: [id number]             PCP: Henrine Screws, MD Referring: Josetta Huddle, MD Visit Date: 12/28/2015 Occupation: Retired Chief Executive Officer and business owner    Subjective:  Muscle atrophy   History of Present Illness: Barry Zavala is a 75 y.o. male he's been seen in consultation per request of Dr. Inda Merlin. He states about 6 months ago he noticed that he has lost her muscles in his bilateral forearm. And also in his hands. He went to see Dr. Inda Merlin who did tell him lab work which was unremarkable except for mild elevation of his CK. He went to see Dr. Fredna Dow after that he states Dr.) did x-ray of his C-spine which showed degenerative disc disease. He did not have any radiculopathy and no further workup was done. 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 He was referred to Dr. Lorin Mercy. According to him Dr. Lorin Mercy evaluated him and give him some topical cream which  was to be used on his hands. He has not noticed any improvement in his symptoms. He does not have any muscle pain or joint pain. He has not noticed much loss and lids muscle strength.  Activities of Daily Living:  Patient reports morning stiffness for 0 minute.   Patient Denies nocturnal pain.  Difficulty dressing/grooming: Denies Difficulty climbing stairs: Denies Difficulty getting out of chair: Denies Difficulty using hands for taps, buttons, cutlery, and/or writing: Denies   Review of Systems  Constitutional: Negative for fatigue, night sweats and weakness ( ).  HENT: Negative for mouth sores, mouth dryness and nose dryness.   Eyes: Negative for redness and dryness.  Respiratory: Negative for shortness of breath and difficulty breathing.   Cardiovascular: Negative for chest pain, palpitations, hypertension, irregular heartbeat and swelling in legs/feet.  Gastrointestinal: Negative for  constipation and diarrhea.  Endocrine: Negative for increased urination.  Musculoskeletal: Positive for arthralgias and joint pain. Negative for joint swelling, myalgias, morning stiffness, muscle tenderness and myalgias.  Skin: Negative for color change, rash, hair loss, nodules/bumps, skin tightness, ulcers and sensitivity to sunlight.  Allergic/Immunologic: Negative for susceptible to infections.  Neurological: Negative for dizziness, fainting, memory loss and night sweats.  Hematological: Negative for swollen glands.  Psychiatric/Behavioral: Negative for depressed mood and sleep disturbance. The patient is not nervous/anxious.     PMFS History:  Patient Active Problem List   Diagnosis Date Noted  . Elevated CPK 12/27/2015  . 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 History of diabetes mellitus 12/27/2015  . History of hypertension 12/27/2015  . Stage 3 chronic kidney disease 12/27/2015  . Heart murmur, systolic 93/81/0175  . Left carotid bruit 01/31/2015  . Hypertension   . Hyperlipidemia   . Type 2 diabetes mellitus ([place])   . Right bundle branch block   . Coronary artery disease   . Obesity     Past Medical History:  Diagnosis Date  . Anemia   . Coronary artery disease   . Elevated PSA   . Erectile dysfunction   . Hyperlipidemia   . Hypertension   . Irritable bowel syndrome   . Obesity   . Onychomycosis   . Palmar fibromatosis   . Right bundle branch block   . Type  2 diabetes mellitus ([place])     Family History  Problem Relation Age of Onset  . Ovarian cancer Mother 58  . Heart attack Father   . Cancer Paternal Uncle     Karposi Sarcoma  . Multiple myeloma Sister 53   Past Surgical History:  Procedure Laterality Date  . APPENDECTOMY    . cardiac stents     Social History   Social History Narrative  . No narrative on file     Objective: Vital Signs: BP 130/71 (BP Location: Left Arm, Patient Position: Sitting, Cuff Size: Large)   Pulse 68   Resp 14    Ht '5\' 11"'$  (1.803 m)   Wt 193 lb (87.5 kg)   BMI 26.92 kg/m    Physical Exam  Constitutional: He is oriented to person, place, and time. He appears well-developed and well-nourished.  HENT:  Head: Normocephalic and atraumatic.  Eyes: Conjunctivae and EOM are normal. Pupils are equal, round, and reactive to light.  Neck: Normal range of motion. Neck supple.  Cardiovascular: Normal rate, regular rhythm and normal heart sounds.   Pulmonary/Chest: Effort normal and breath sounds normal.  Abdominal: Soft. Bowel sounds are normal.  Neurological: He is alert and oriented to person, place, and time.  Skin: Skin is warm and dry. Capillary refill takes less than 2 seconds.  Psychiatric: He has a normal mood and affect. His behavior is normal.  Nursing note and vitals reviewed.    Musculoskeletal Exam: C-spine limited lateral rotation. With no radiculopathy. Thoracic and lumbar spine good range of motion. Shoulder joints elbow joints wrist joints with good range of motion he has DIP PIP thickening in his hands with no synovitis. Hip joints knee joints ankles MTPs PIPs with good range of motion with no synovitis. Muscle atrophy was noted in bilateral forearm In Cerner muscle group and flexor muscle group. He also has atrophy of the extensors of his bilateral hands.  CDAI Exam: No CDAI exam completed.    Investigation: Findings:  12/06/2015 CBC with Diff normal, CPK elevated 268, and Sed rate Normal 16, 04/03/2015 hemoglobin A1c 8.5, BMP creatinine 1.5 GFR 46, CBC normal, LFTs normal, TSH 2.79 normal, vitamin D 41.8 normal, lipid panel normal  Patient brought x-rays of his C-spine today which was a photocopy issues multilevel disc disease of C-spine most significant narrowing was noted at C4-5 and C5-6 and facet joint narrowing was noted. X-ray of left wrist from 11/19/2015 shows left CMC narrowing and left second MCP narrowing none of the other joints showed any narrowing or erosive  changes.    Imaging: Dg Chest 2 View  Result Date: 12/17/2015 CLINICAL DATA:  Productive cough for 3 weeks. EXAM: CHEST  2 VIEW COMPARISON:  05/01/2009 FINDINGS: Cardiac silhouette is normal size. Left coronary artery stent is stable. No mediastinal or hilar masses. No evidence of adenopathy. Clear lungs.  No pleural effusion.  No pneumothorax. Skeletal structures are demineralized but intact. IMPRESSION: No active cardiopulmonary disease. Electronically Signed   By: Lajean Manes M.D.   On: 12/17/2015 16:08    Speciality Comments: No specialty comments available.    Procedures:  No procedures performed Allergies: Patient has no known allergies.   Assessment / Plan:     Visit Diagnoses: Elevated CPK: CK is mildly elevated which could be seen sometimes with inclusion body myositis, myopathy, with a statin use. He does not having significant muscle weakness on examination today  Atrophy of muscle of bilateral  hands and forearm: I'm uncertain about  the etiology I would like to obtain MRI of her C-spine and also chest x-ray. I will refer him to  neurology for further evaluation. He will need EMG nerve conduction and further workup. If neurological workup was negative then muscle biopsy may be useful to evaluate this further.  He has mild osteoarthritic changes in his hands. His hands are not symptomatic.   History of diabetes mellitus  History of hypertension  Coronary artery disease involving native coronary artery of native heart without angina pectoris  Essential hypertension  Mixed hyperlipidemia   His other medical problems include history of right bundle branch block, diabetes mellitus, peripheral vascular disease, chronic renal disease stage III, vitamin D deficiency, palmar fibromatosis,Onychomycosis of toe nails, IBS, right second trigger finger . Orders: Orders Placed This Encounter  Procedures  . MR CERVICAL SPINE WO CONTRAST  . Ambulatory referral to Neurology    No orders of the defined types were placed in this encounter.   Face-to-face time spent with patient was 50 minutes. 50% of time was spent in counseling and coordination of care.  Follow-Up Instructions: New patient follow-up appointment.   Bo Merino, MD

## 2015-12-28 ENCOUNTER — Encounter: Payer: Self-pay | Admitting: Rheumatology

## 2015-12-28 ENCOUNTER — Ambulatory Visit (INDEPENDENT_AMBULATORY_CARE_PROVIDER_SITE_OTHER): Payer: Medicare Other | Admitting: Rheumatology

## 2015-12-28 VITALS — BP 130/71 | HR 68 | Resp 14 | Ht 71.0 in | Wt 193.0 lb

## 2015-12-28 DIAGNOSIS — N183 Chronic kidney disease, stage 3 unspecified: Secondary | ICD-10-CM

## 2015-12-28 DIAGNOSIS — I2510 Atherosclerotic heart disease of native coronary artery without angina pectoris: Secondary | ICD-10-CM | POA: Diagnosis not present

## 2015-12-28 DIAGNOSIS — E782 Mixed hyperlipidemia: Secondary | ICD-10-CM

## 2015-12-28 DIAGNOSIS — M62542 Muscle wasting and atrophy, not elsewhere classified, left hand: Secondary | ICD-10-CM

## 2015-12-28 DIAGNOSIS — M62541 Muscle wasting and atrophy, not elsewhere classified, right hand: Secondary | ICD-10-CM | POA: Diagnosis not present

## 2015-12-28 DIAGNOSIS — Z8639 Personal history of other endocrine, nutritional and metabolic disease: Secondary | ICD-10-CM

## 2015-12-28 DIAGNOSIS — R748 Abnormal levels of other serum enzymes: Secondary | ICD-10-CM

## 2016-01-02 DIAGNOSIS — G5623 Lesion of ulnar nerve, bilateral upper limbs: Secondary | ICD-10-CM | POA: Diagnosis not present

## 2016-01-02 DIAGNOSIS — M1812 Unilateral primary osteoarthritis of first carpometacarpal joint, left hand: Secondary | ICD-10-CM | POA: Diagnosis not present

## 2016-01-09 ENCOUNTER — Telehealth: Payer: Self-pay | Admitting: Rheumatology

## 2016-01-09 NOTE — Telephone Encounter (Signed)
Called and s/w wife about MRI advised her that I had been out since Dec 22 and came back Jan 2 and that I am working on the Dec 22 orders, Wife understood, I advised the pt that I will contact [facility] to get pt scheduled for MRI, pt is scheduled for this Saturday Jan 6 at [facility] at [facility]

## 2016-01-09 NOTE — Telephone Encounter (Signed)
Please call patient in regards to his MRI. Patient is upset; please call Kalman Shan, pt's wife, at [phone]

## 2016-01-11 ENCOUNTER — Ambulatory Visit (HOSPITAL_COMMUNITY)
Admission: RE | Admit: 2016-01-11 | Discharge: 2016-01-11 | Disposition: A | Discharge status: Home / Self Care | Payer: Medicare Other | Source: Ambulatory Visit | Attending: Rheumatology | Admitting: Rheumatology

## 2016-01-11 DIAGNOSIS — M2578 Osteophyte, vertebrae: Secondary | ICD-10-CM | POA: Insufficient documentation

## 2016-01-11 DIAGNOSIS — M62541 Muscle wasting and atrophy, not elsewhere classified, right hand: Secondary | ICD-10-CM

## 2016-01-11 DIAGNOSIS — M62542 Muscle wasting and atrophy, not elsewhere classified, left hand: Secondary | ICD-10-CM | POA: Diagnosis present

## 2016-01-11 DIAGNOSIS — M50323 Other cervical disc degeneration at C6-C7 level: Secondary | ICD-10-CM | POA: Diagnosis not present

## 2016-01-11 DIAGNOSIS — M4802 Spinal stenosis, cervical region: Secondary | ICD-10-CM | POA: Diagnosis not present

## 2016-01-11 DIAGNOSIS — M5021 Other cervical disc displacement,  high cervical region: Secondary | ICD-10-CM | POA: Diagnosis not present

## 2016-01-11 NOTE — Progress Notes (Signed)
Mild degenerative disc disease of C-spine and mild spinal stenosis. It does not explain muscle atrophy in his hands. He should keep the  appointment with neurology.

## 2016-01-12 ENCOUNTER — Ambulatory Visit (HOSPITAL_COMMUNITY): Admission: RE | Admit: 2016-01-12 | Payer: Medicare Other | Source: Ambulatory Visit

## 2016-01-14 DIAGNOSIS — E784 Other hyperlipidemia: Secondary | ICD-10-CM | POA: Diagnosis not present

## 2016-01-14 DIAGNOSIS — Z6826 Body mass index (BMI) 26.0-26.9, adult: Secondary | ICD-10-CM | POA: Diagnosis not present

## 2016-01-14 DIAGNOSIS — N08 Glomerular disorders in diseases classified elsewhere: Secondary | ICD-10-CM | POA: Diagnosis not present

## 2016-01-14 DIAGNOSIS — Z1389 Encounter for screening for other disorder: Secondary | ICD-10-CM | POA: Diagnosis not present

## 2016-01-14 DIAGNOSIS — E1121 Type 2 diabetes mellitus with diabetic nephropathy: Secondary | ICD-10-CM | POA: Diagnosis not present

## 2016-01-14 DIAGNOSIS — I2510 Atherosclerotic heart disease of native coronary artery without angina pectoris: Secondary | ICD-10-CM | POA: Diagnosis not present

## 2016-01-14 DIAGNOSIS — E1129 Type 2 diabetes mellitus with other diabetic kidney complication: Secondary | ICD-10-CM | POA: Diagnosis not present

## 2016-01-14 DIAGNOSIS — I10 Essential (primary) hypertension: Secondary | ICD-10-CM | POA: Diagnosis not present

## 2016-01-14 DIAGNOSIS — M5412 Radiculopathy, cervical region: Secondary | ICD-10-CM | POA: Diagnosis not present

## 2016-01-14 DIAGNOSIS — I779 Disorder of arteries and arterioles, unspecified: Secondary | ICD-10-CM | POA: Diagnosis not present

## 2016-01-16 DIAGNOSIS — M1812 Unilateral primary osteoarthritis of first carpometacarpal joint, left hand: Secondary | ICD-10-CM | POA: Diagnosis not present

## 2016-01-16 DIAGNOSIS — M5412 Radiculopathy, cervical region: Secondary | ICD-10-CM | POA: Diagnosis not present

## 2016-01-16 DIAGNOSIS — G5623 Lesion of ulnar nerve, bilateral upper limbs: Secondary | ICD-10-CM | POA: Diagnosis not present

## 2016-01-16 DIAGNOSIS — M47812 Spondylosis without myelopathy or radiculopathy, cervical region: Secondary | ICD-10-CM | POA: Diagnosis not present

## 2016-01-30 DIAGNOSIS — M47812 Spondylosis without myelopathy or radiculopathy, cervical region: Secondary | ICD-10-CM | POA: Insufficient documentation

## 2016-01-30 NOTE — Progress Notes (Signed)
Office Visit Note  Patient: Barry Zavala             Date of Birth: 1940-07-11           MRN: [id number]             PCP: Henrine Screws, MD Referring: Josetta Huddle, MD Visit Date: 01/31/2016 Occupation: '@GUAROCC'$ @    Subjective:  Elevated CK   History of Present Illness: Barry Zavala is a 76 y.o. male with history of elevated CK and muscle atrophy involving his bilateral upper extremities. He states he has some teeth extraction after that his right upper extremity pain resolved. He continues to have minimal discomfort in his C-spine. He had MRI of his C-spine which showed only mild spinal stenosis but he does not have any symptoms from that. He has appointment coming up with the tube neurology in May 2018. In the meantime he went to see Dr. Fredna Dow who had his bilateral upper extremity EMG and nerve conduction velocities. According to patient there were consistent with carpal tunnel syndrome bilaterally and bilateral ulnar nerve entrapment.  Activities of Daily Living:  Patient reports morning stiffness for0 minutes.   Patient Denies nocturnal pain.  Difficulty dressing/grooming: Denies Difficulty climbing stairs: Denies Difficulty getting out of chair: Denies Difficulty using hands for taps, buttons, cutlery, and/or writing: Denies   Review of Systems  Constitutional: Negative for fatigue, night sweats and weakness ( ).  HENT: Negative for mouth sores, mouth dryness and nose dryness.   Eyes: Negative for redness and dryness.  Respiratory: Negative for shortness of breath and difficulty breathing.   Cardiovascular: Negative for chest pain, palpitations, hypertension, irregular heartbeat and swelling in legs/feet.  Gastrointestinal: Negative for constipation and diarrhea.  Endocrine: Negative for increased urination.  Musculoskeletal: Positive for muscle weakness. Negative for arthralgias, joint pain, joint swelling, myalgias, morning stiffness, muscle tenderness and  myalgias.  Skin: Negative for color change, rash, hair loss, nodules/bumps, skin tightness, ulcers and sensitivity to sunlight.  Allergic/Immunologic: Negative for susceptible to infections.  Neurological: Negative for dizziness, fainting, memory loss and night sweats.  Hematological: Negative for swollen glands.  Psychiatric/Behavioral: Negative for depressed mood and sleep disturbance. The patient is not nervous/anxious.     PMFS History:  Patient Active Problem List   Diagnosis Date Noted  . DJD (degenerative joint disease), cervical 01/30/2016  . Elevated CPK 12/27/2015  . 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 History of diabetes mellitus 12/27/2015  . History of hypertension 12/27/2015  . Stage 3 chronic kidney disease 12/27/2015  . Heart murmur, systolic 13/08/6576  . Left carotid bruit 01/31/2015  . Hypertension   . Hyperlipidemia   . Type 2 diabetes mellitus ([place])   . Right bundle branch block   . Coronary artery disease   . Obesity     Past Medical History:  Diagnosis Date  . Anemia   . Coronary artery disease   . Elevated PSA   . Erectile dysfunction   . Hyperlipidemia   . Hypertension   . Irritable bowel syndrome   . Obesity   . Onychomycosis   . Palmar fibromatosis   . Right bundle branch block   . Type 2 diabetes mellitus ([facility])     Family History  Problem Relation Age of Onset  . Ovarian cancer Mother 11  . Heart attack Father   . Cancer Paternal Uncle     Karposi Sarcoma  . Multiple myeloma Sister 64   Past Surgical History:  Procedure Laterality  Date  . APPENDECTOMY    . cardiac stents     Social History   Social History Narrative  . No narrative on file     Objective: Vital Signs: BP (!) 119/52 (BP Location: Left Arm, Patient Position: Sitting, Cuff Size: Normal)   Pulse 78   Resp 14   Ht '5\' 11"'$  (1.803 m)   Wt 190 lb (86.2 kg)   BMI 26.50 kg/m    Physical Exam  Neurological:  Muscle atrophy noted in the bilateral hands  abductor muscles, and extensors of his bilateral forearm. Overall muscle strength was 4+ over 5.     Musculoskeletal Exam: C-spine and thoracic lumbar spine full range of motion. Shoulder joints although joints wrist joint MCPs PIPs with good range of motion with no synovitis. Hip joints knee joints ankles MTPs PIPs DIPs with good range of motion with no synovitis.  CDAI Exam: No CDAI exam completed.    Investigation: Findings:  12/06/2015 CBC with Diff normal, CPK elevated 268, and Sed rate Normal 16, 04/03/2015 hemoglobin A1c 8.5, BMP creatinine 1.5 GFR 46, CBC normal, LFTs normal, TSH 2.79 normal, vitamin D 41.8 normal, lipid panel normal     Imaging: Mr Cervical Spine Wo Contrast  Result Date: 01/11/2016 CLINICAL DATA:  Pt states about 6 months ago he noticed that he has lost his muscles in his bilateral forearm. And also in his hands. EXAM: MRI CERVICAL SPINE WITHOUT CONTRAST TECHNIQUE: Multiplanar, multisequence MR imaging of the cervical spine was performed. No intravenous contrast was administered. COMPARISON:  None. FINDINGS: Alignment: Physiologic. Vertebrae: No fracture, evidence of discitis, or bone lesion. Cord: Normal signal and morphology. Posterior Fossa, vertebral arteries, paraspinal tissues: Negative. Disc levels: Discs: Degenerative disc disease with disc height loss at C4-5, C5-6 and C6-7. C2-3: Mild broad-based disc bulge. Mild left facet arthropathy. No neural foraminal stenosis. No central canal stenosis. C3-4: No significant disc bulge. No neural foraminal stenosis. No central canal stenosis. Mild bilateral facet arthropathy. C4-5: Broad-based disc osteophyte complex. Bilateral uncovertebral degenerative changes and mild facet arthropathy resulting in moderate bilateral foraminal stenosis. No central canal stenosis. C5-6: Broad-based disc osteophyte complex. Bilateral uncovertebral degenerative changes. Mild right foraminal stenosis. Moderate left foraminal stenosis. Mild  central canal stenosis. C6-7: Mild broad-based disc bulge. Minimal left foraminal narrowing. No right foraminal stenosis. No central canal stenosis. C7-T1: No significant disc bulge. No neural foraminal stenosis. No central canal stenosis. IMPRESSION: 1. At C4-5 there is a broad-based disc osteophyte complex. Bilateral uncovertebral degenerative changes and mild facet arthropathy resulting in moderate bilateral foraminal stenosis. 2. At C5-6 there is a broad-based disc osteophyte complex. Bilateral uncovertebral degenerative changes. Mild right foraminal stenosis. Moderate left foraminal stenosis. Mild central canal stenosis. Electronically Signed   By: Kathreen Devoid   On: 01/11/2016 08:50    Speciality Comments: No specialty comments available.    Procedures:  No procedures performed Allergies: Patient has no known allergies.   Assessment / Plan:     Visit Diagnoses: Elevated CPK: CK is mildly elevated which could be related to statin use. 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 At this point he would like to wait on that. I wanted to order anti-HMG CR antibody but could not find it in our lab.  DJD (degenerative joint disease), cervical - With C4-5 and C5-6 disc bulge and mild spinal stenosis. Not very symptomatic.  Atrophy of bilateral hand muscles  and forearm muscles. According to patient the induction and EMG test done at Dr. Levell July office revealed bilateral carpal tunnel syndrome and bilateral ulnar nerve entrapment. I do not have those results review. He has appointment coming up with neurology  in May.  History of hypertension  Left carotid bruit  History of diabetes mellitus    Orders: No orders of the defined types were placed in this encounter.  No orders of the defined types were placed in this encounter.   Face-to-face time spent with patient was 30 minutes. 50% of time  was spent in counseling and coordination of care.  Follow-Up Instructions: Return in about 6 months (around 07/30/2016) for Muscle atrophy and elevated CK.   Bo Merino, MD  Note - This record has been created using Editor, commissioning.  Chart creation errors have been sought, but may not always  have been located. Such creation errors do not reflect on  the standard of medical care.

## 2016-01-31 ENCOUNTER — Encounter: Payer: Self-pay | Admitting: Rheumatology

## 2016-01-31 ENCOUNTER — Ambulatory Visit (INDEPENDENT_AMBULATORY_CARE_PROVIDER_SITE_OTHER): Payer: Medicare Other | Admitting: Rheumatology

## 2016-01-31 ENCOUNTER — Ambulatory Visit: Payer: Medicare Other | Admitting: Rheumatology

## 2016-01-31 VITALS — BP 119/52 | HR 78 | Resp 14 | Ht 71.0 in | Wt 190.0 lb

## 2016-01-31 DIAGNOSIS — M5030 Other cervical disc degeneration, unspecified cervical region: Secondary | ICD-10-CM

## 2016-01-31 DIAGNOSIS — R0989 Other specified symptoms and signs involving the circulatory and respiratory systems: Secondary | ICD-10-CM | POA: Diagnosis not present

## 2016-01-31 DIAGNOSIS — R748 Abnormal levels of other serum enzymes: Secondary | ICD-10-CM | POA: Diagnosis not present

## 2016-01-31 DIAGNOSIS — Z8679 Personal history of other diseases of the circulatory system: Secondary | ICD-10-CM | POA: Diagnosis not present

## 2016-01-31 DIAGNOSIS — M62541 Muscle wasting and atrophy, not elsewhere classified, right hand: Secondary | ICD-10-CM | POA: Diagnosis not present

## 2016-01-31 DIAGNOSIS — Z8639 Personal history of other endocrine, nutritional and metabolic disease: Secondary | ICD-10-CM | POA: Diagnosis not present

## 2016-01-31 DIAGNOSIS — M47812 Spondylosis without myelopathy or radiculopathy, cervical region: Secondary | ICD-10-CM

## 2016-01-31 DIAGNOSIS — I2510 Atherosclerotic heart disease of native coronary artery without angina pectoris: Secondary | ICD-10-CM | POA: Diagnosis not present

## 2016-02-01 ENCOUNTER — Ambulatory Visit (INDEPENDENT_AMBULATORY_CARE_PROVIDER_SITE_OTHER): Payer: Medicare Other | Admitting: Interventional Cardiology

## 2016-02-01 ENCOUNTER — Telehealth: Payer: Self-pay | Admitting: Interventional Cardiology

## 2016-02-01 ENCOUNTER — Encounter: Payer: Self-pay | Admitting: Interventional Cardiology

## 2016-02-01 VITALS — BP 116/50 | HR 68 | Ht 71.0 in | Wt 183.8 lb

## 2016-02-01 DIAGNOSIS — I2510 Atherosclerotic heart disease of native coronary artery without angina pectoris: Secondary | ICD-10-CM | POA: Diagnosis not present

## 2016-02-01 DIAGNOSIS — E784 Other hyperlipidemia: Secondary | ICD-10-CM | POA: Diagnosis not present

## 2016-02-01 DIAGNOSIS — I358 Other nonrheumatic aortic valve disorders: Secondary | ICD-10-CM | POA: Diagnosis not present

## 2016-02-01 DIAGNOSIS — R0989 Other specified symptoms and signs involving the circulatory and respiratory systems: Secondary | ICD-10-CM | POA: Diagnosis not present

## 2016-02-01 DIAGNOSIS — I4510 Unspecified right bundle-branch block: Secondary | ICD-10-CM | POA: Diagnosis not present

## 2016-02-01 DIAGNOSIS — I10 Essential (primary) hypertension: Secondary | ICD-10-CM | POA: Diagnosis not present

## 2016-02-01 DIAGNOSIS — E7849 Other hyperlipidemia: Secondary | ICD-10-CM

## 2016-02-01 MED ORDER — ASPIRIN EC 81 MG PO TBEC: 81.0000 mg | DELAYED_RELEASE_TABLET | Freq: Every day | ORAL | 3 refills | Status: AC

## 2016-02-01 NOTE — Telephone Encounter (Signed)
Noted  

## 2016-02-01 NOTE — Patient Instructions (Signed)
Medication Instructions:  1) DECREASE Aspirin to 81mg  once daily  Labwork: Call us with your LDL cholesterol from when you were on Vytorin.   Testing/Procedures: None  Follow-Up: Your physician wants you to follow-up in: 1 year with Dr. Tamala Julian.  You will receive a reminder letter in the mail two months in advance. If you don't receive a letter, please call our office to schedule the follow-up appointment.   Any Other Special Instructions Will Be Listed Below (If Applicable).     If you need a refill on your cardiac medications before your next appointment, please call your pharmacy.

## 2016-02-01 NOTE — Telephone Encounter (Signed)
[name] is calling about Barry Zavala LDL. On 04/03/15 it was 50 but not sure if he had switch to the new medication at that time . Will see Dr. Inda Merlin on 05/01/16 and will find out what the  LDL then . If not under 70 like you said , then they will call back . Please call if you have any questions .   Thanks

## 2016-02-01 NOTE — Telephone Encounter (Signed)
Will route to Dr. Smith to make him aware. 

## 2016-02-01 NOTE — Progress Notes (Addendum)
Cardiology Office Note   Date:  02/01/2016   ID:  Barry Zavala, DOB [birth year], MRN [id number]  PCP:  Henrine Screws, MD  Cardiologist:  Sinclair Grooms, MD   Chief Complaint  Patient presents with  . Coronary Artery Disease      History of Present Illness: Barry Zavala is a 76 y.o. male who presents for CAD, prior LAD acute coronary syndrome resulting in PCI (1995 and 2001) without stent, diabetes mellitus type 2, hyperlipidemia, and essential hypertension.  He denies angina, claudication, syncope, palpitations, dyspnea, and edema. He is relatively sedentary. No neurological complaints other than as noted in the review of systems.   Past Medical History:  Diagnosis Date  . Anemia   . Coronary artery disease   . Elevated PSA   . Erectile dysfunction   . Hyperlipidemia   . Hypertension   . Irritable bowel syndrome   . Obesity   . Onychomycosis   . Palmar fibromatosis   . Right bundle branch block   . Type 2 diabetes mellitus ([place])     Past Surgical History:  Procedure Laterality Date  . APPENDECTOMY    . cardiac stents       Current Outpatient Prescriptions  Medication Sig Dispense Refill  . aspirin 325 MG tablet Take 325 mg by mouth daily.    . cholecalciferol (VITAMIN D) 1000 UNITS tablet Take 2,000 Units by mouth daily.    . Empagliflozin (JARDIANCE) 10 MG TABS Take by mouth.    . ezetimibe-simvastatin (VYTORIN) 10-20 MG tablet Take 1 tablet by mouth daily. 90 tablet 1  . glyBURIDE-metformin (GLUCOVANCE) 5-500 MG per tablet Take 2 tablets by mouth 2 (two) times daily with a meal.    . metoprolol succinate (TOPROL-XL) 50 MG 24 hr tablet Take 50 mg by mouth daily. Take with or immediately following a meal.    . Multiple Vitamins-Minerals (CENTRUM SILVER PO) Take 1 tablet by mouth daily.    Marland Kitchen omega-3 acid ethyl esters (LOVAZA) 1 G capsule Take 1 g by mouth 3 (three) times daily.    Marland Kitchen omeprazole (PRILOSEC) 20 MG capsule Take 20 mg by mouth daily.     Marland Kitchen omeprazole (PRILOSEC) 20 MG capsule Take 20 mg by mouth 2 (two) times a week.    . ONGLYZA 5 MG TABS tablet Take 5 mg by mouth daily.   2  . pioglitazone (ACTOS) 45 MG tablet Take 45 mg by mouth daily.    . ramipril (ALTACE) 10 MG capsule Take 10 mg by mouth daily.     No current facility-administered medications for this visit.     Allergies:   Patient has no known allergies.    Social History:  The patient  reports that he quit smoking about 30 years ago. His smoking use included Cigarettes. He has a 20.00 pack-year smoking history. He has never used smokeless tobacco. He reports that he does not drink alcohol or use drugs.   Family History:  The patient's family history includes Cancer in his paternal uncle; Heart attack in his father; Multiple myeloma (age of onset: 45) in his sister; Ovarian cancer (age of onset: 74) in his mother.    ROS:  Please see the history of present illness.    Bilateral hand interosseous atrophy. Otherwise, review of systems are positive for none.   All other systems are reviewed and negative.    PHYSICAL EXAM: VS:  BP (!) 116/50 (BP Location: Left Arm, Patient Position: Sitting, Cuff Size:  Normal)   Pulse 68   Ht '5\' 11"'$  (1.803 m)   Wt 183 lb 12.8 oz (83.4 kg)   BMI 25.63 kg/m  , BMI Body mass index is 25.63 kg/m. GEN: Well nourished, well developed, in no acute distress  HEENT: normal  Neck: no JVD, but there is a carotid bruit. No masses are palpated Cardiac: RRR.  There is a 1-2/6 right upper sternal border crescendo decrescendo systolic murmur. There is no rub, or gallop. There is no edema. Respiratory:  clear to auscultation bilaterally, normal work of breathing. GI: soft, nontender, nondistended, + BS MS: no deformity or atrophy  Skin: warm and dry, no rash Neuro:  Strength and sensation are intact Psych: euthymic mood, full affect   EKG:  EKG normal sinus rhythm with right bundle branch block. No change compared to  prior.    Recent Labs: No results found for requested labs within last 8760 hours.    Lipid Panel    Component Value Date/Time   CHOL 83 (L) 01/24/2015 0949   TRIG 49 01/24/2015 0949   HDL 38 (L) 01/24/2015 0949   CHOLHDL 2.2 01/24/2015 0949   VLDL 10 01/24/2015 0949   LDLCALC 35 01/24/2015 0949      Wt Readings from Last 3 Encounters:  02/01/16 183 lb 12.8 oz (83.4 kg)  01/31/16 190 lb (86.2 kg)  12/28/15 193 lb (87.5 kg)      Other studies Reviewed: Additional studies/ records that were reviewed today include:  Mild cardial perfusion imaging 02/08/15: Study Highlights    Nuclear stress EF: 59%.  There was no ST segment deviation noted during stress.  This is a low risk study.  The left ventricular ejection fraction is normal (55-65%).   Low risk stress nuclear study with small, moderate intensity, fixed defects in the mid anteroseptal and inferior basal walls; findings consistent with soft tissue attenuation vs small prior infarct; no ischemia; EF 59 with normal wall motion.   Noninvasive carotid imaging: 02/02/15 Impressions Heterogeneous plaque, bilaterally. 1-39% bilateral ICA stenosis. Normal subclavian arteries, bilaterally. Patent vertebral arteries with antegrade flow. F/u PRN  ASSESSMENT AND PLAN:  1. Coronary artery disease involving native coronary artery of native heart without angina pectoris Asymptomatic and with nonischemic stress tests 12 months ago. No testing needed at this time.  2. Hyperlipidemia Followed by primary care. We will get a copy of the recent laboratory data to ensure we are at target LDL less than 70. Addendum: Have spoken with Dr. America Brown office, and most recent LDL within the past 2 months is less than 50.  3. Essential hypertension Controlled. Goal blood pressure less than 140/90 mmHg.  4. Right bundle branch block Unchanged  5. Left carotid bruit Demonstrated minimal plaque less than 40% by study last year as noted  above.  6. 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 Quality of murmur has not changed. Consider echo next year.  Current medicines are reviewed at length with the patient today.  The patient has the following concerns regarding medicines: Will continue Vytorin 40/10 mg until he gives out of the current supply and then decreased 20/10 mg..  The following changes/actions have been instituted:    Encouraged aerobic activity.  We discussed LDL target less than 70.  We discussed blood pressure target less than 140/90 mmHg.  We'll consider echocardiography in one year to assess aortic valve. Labs/ tests ordered today include:   No orders of the defined types were placed in this encounter.  Disposition:   FU with HS in 1 year  Signed, Sinclair Grooms, MD  02/01/2016 8:11 AM    [facility] [address], [place], [place]  [address] Phone: [phone]; Fax: [phone]

## 2016-03-03 ENCOUNTER — Other Ambulatory Visit: Payer: Self-pay | Admitting: Interventional Cardiology

## 2016-03-06 ENCOUNTER — Ambulatory Visit: Payer: Medicare Other | Admitting: Rheumatology

## 2016-03-07 ENCOUNTER — Telehealth: Payer: Self-pay | Admitting: Interventional Cardiology

## 2016-03-07 NOTE — Telephone Encounter (Signed)
Rep from [employer] called to let me know pt's Vytorin has been approved.  They will fax approval letter.

## 2016-03-07 NOTE — Telephone Encounter (Signed)
New message       Calling to get prior authorization on vytorin 10/20

## 2016-03-07 NOTE — Telephone Encounter (Signed)
Follow Up:    She wants to know if the pt is taking the generic or brand name for her Vytorin?

## 2016-03-07 NOTE — Telephone Encounter (Signed)
Spoke with Earnest Bailey at [facility] and made her aware that I sent in [place] today.  [facility] appreciative for call back.

## 2016-03-07 NOTE — Telephone Encounter (Signed)
Spoke with Vicente Males at [facility] and clarified that PA was for brand name, not generic.  Vicente Males states that is all the information they needed to get PA sent over to [facility].  She states usually about 72 hrs for response on PA.

## 2016-03-10 DIAGNOSIS — H524 Presbyopia: Secondary | ICD-10-CM | POA: Diagnosis not present

## 2016-03-10 DIAGNOSIS — H2513 Age-related nuclear cataract, bilateral: Secondary | ICD-10-CM | POA: Diagnosis not present

## 2016-03-19 DIAGNOSIS — H2513 Age-related nuclear cataract, bilateral: Secondary | ICD-10-CM | POA: Diagnosis not present

## 2016-03-26 DIAGNOSIS — H2511 Age-related nuclear cataract, right eye: Secondary | ICD-10-CM | POA: Diagnosis not present

## 2016-03-26 DIAGNOSIS — H2512 Age-related nuclear cataract, left eye: Secondary | ICD-10-CM | POA: Diagnosis not present

## 2016-04-16 ENCOUNTER — Other Ambulatory Visit: Payer: Self-pay | Admitting: Internal Medicine

## 2016-04-16 DIAGNOSIS — N5201 Erectile dysfunction due to arterial insufficiency: Secondary | ICD-10-CM | POA: Diagnosis not present

## 2016-04-16 DIAGNOSIS — I10 Essential (primary) hypertension: Secondary | ICD-10-CM | POA: Diagnosis not present

## 2016-04-16 DIAGNOSIS — R0989 Other specified symptoms and signs involving the circulatory and respiratory systems: Secondary | ICD-10-CM | POA: Diagnosis not present

## 2016-04-16 DIAGNOSIS — N183 Chronic kidney disease, stage 3 (moderate): Secondary | ICD-10-CM | POA: Diagnosis not present

## 2016-04-16 DIAGNOSIS — Z0001 Encounter for general adult medical examination with abnormal findings: Secondary | ICD-10-CM | POA: Diagnosis not present

## 2016-04-16 DIAGNOSIS — I450 Right fascicular block: Secondary | ICD-10-CM | POA: Diagnosis not present

## 2016-04-16 DIAGNOSIS — Z7984 Long term (current) use of oral hypoglycemic drugs: Secondary | ICD-10-CM | POA: Diagnosis not present

## 2016-04-16 DIAGNOSIS — E119 Type 2 diabetes mellitus without complications: Secondary | ICD-10-CM | POA: Diagnosis not present

## 2016-04-16 DIAGNOSIS — Z1389 Encounter for screening for other disorder: Secondary | ICD-10-CM | POA: Diagnosis not present

## 2016-04-16 DIAGNOSIS — Z79899 Other long term (current) drug therapy: Secondary | ICD-10-CM | POA: Diagnosis not present

## 2016-04-16 DIAGNOSIS — H2512 Age-related nuclear cataract, left eye: Secondary | ICD-10-CM | POA: Diagnosis not present

## 2016-04-16 DIAGNOSIS — I2510 Atherosclerotic heart disease of native coronary artery without angina pectoris: Secondary | ICD-10-CM | POA: Diagnosis not present

## 2016-04-16 DIAGNOSIS — E782 Mixed hyperlipidemia: Secondary | ICD-10-CM | POA: Diagnosis not present

## 2016-04-21 DIAGNOSIS — Z79899 Other long term (current) drug therapy: Secondary | ICD-10-CM | POA: Diagnosis not present

## 2016-04-21 DIAGNOSIS — E782 Mixed hyperlipidemia: Secondary | ICD-10-CM | POA: Diagnosis not present

## 2016-04-21 DIAGNOSIS — E119 Type 2 diabetes mellitus without complications: Secondary | ICD-10-CM | POA: Diagnosis not present

## 2016-04-21 DIAGNOSIS — Z0000 Encounter for general adult medical examination without abnormal findings: Secondary | ICD-10-CM | POA: Diagnosis not present

## 2016-04-21 DIAGNOSIS — Z125 Encounter for screening for malignant neoplasm of prostate: Secondary | ICD-10-CM | POA: Diagnosis not present

## 2016-04-21 DIAGNOSIS — E559 Vitamin D deficiency, unspecified: Secondary | ICD-10-CM | POA: Diagnosis not present

## 2016-04-21 DIAGNOSIS — Z7984 Long term (current) use of oral hypoglycemic drugs: Secondary | ICD-10-CM | POA: Diagnosis not present

## 2016-04-21 DIAGNOSIS — I10 Essential (primary) hypertension: Secondary | ICD-10-CM | POA: Diagnosis not present

## 2016-04-28 ENCOUNTER — Ambulatory Visit
Admission: RE | Admit: 2016-04-28 | Discharge: 2016-04-28 | Disposition: A | Discharge status: Home / Self Care | Payer: Medicare Other | Source: Ambulatory Visit | Attending: Internal Medicine | Admitting: Internal Medicine

## 2016-04-28 DIAGNOSIS — I6523 Occlusion and stenosis of bilateral carotid arteries: Secondary | ICD-10-CM | POA: Diagnosis not present

## 2016-04-28 DIAGNOSIS — R0989 Other specified symptoms and signs involving the circulatory and respiratory systems: Secondary | ICD-10-CM

## 2016-05-08 DIAGNOSIS — R972 Elevated prostate specific antigen [PSA]: Secondary | ICD-10-CM | POA: Diagnosis not present

## 2016-05-13 ENCOUNTER — Other Ambulatory Visit: Payer: Self-pay | Admitting: Dermatology

## 2016-05-13 DIAGNOSIS — L570 Actinic keratosis: Secondary | ICD-10-CM | POA: Diagnosis not present

## 2016-05-13 DIAGNOSIS — D0439 Carcinoma in situ of skin of other parts of face: Secondary | ICD-10-CM | POA: Diagnosis not present

## 2016-05-13 DIAGNOSIS — C4492 Squamous cell carcinoma of skin, unspecified: Secondary | ICD-10-CM

## 2016-05-13 DIAGNOSIS — D229 Melanocytic nevi, unspecified: Secondary | ICD-10-CM | POA: Diagnosis not present

## 2016-05-13 DIAGNOSIS — L918 Other hypertrophic disorders of the skin: Secondary | ICD-10-CM | POA: Diagnosis not present

## 2016-05-13 HISTORY — DX: Squamous cell carcinoma of skin, unspecified: C44.92

## 2016-05-21 DIAGNOSIS — G5603 Carpal tunnel syndrome, bilateral upper limbs: Secondary | ICD-10-CM | POA: Diagnosis not present

## 2016-05-21 DIAGNOSIS — G5623 Lesion of ulnar nerve, bilateral upper limbs: Secondary | ICD-10-CM | POA: Diagnosis not present

## 2016-05-23 DIAGNOSIS — Z6826 Body mass index (BMI) 26.0-26.9, adult: Secondary | ICD-10-CM | POA: Diagnosis not present

## 2016-05-23 DIAGNOSIS — D649 Anemia, unspecified: Secondary | ICD-10-CM | POA: Diagnosis not present

## 2016-05-23 DIAGNOSIS — E1121 Type 2 diabetes mellitus with diabetic nephropathy: Secondary | ICD-10-CM | POA: Diagnosis not present

## 2016-05-23 DIAGNOSIS — Z1389 Encounter for screening for other disorder: Secondary | ICD-10-CM | POA: Diagnosis not present

## 2016-05-23 DIAGNOSIS — E784 Other hyperlipidemia: Secondary | ICD-10-CM | POA: Diagnosis not present

## 2016-05-23 DIAGNOSIS — N08 Glomerular disorders in diseases classified elsewhere: Secondary | ICD-10-CM | POA: Diagnosis not present

## 2016-05-23 DIAGNOSIS — I2510 Atherosclerotic heart disease of native coronary artery without angina pectoris: Secondary | ICD-10-CM | POA: Diagnosis not present

## 2016-05-23 DIAGNOSIS — I10 Essential (primary) hypertension: Secondary | ICD-10-CM | POA: Diagnosis not present

## 2016-06-04 DIAGNOSIS — N5201 Erectile dysfunction due to arterial insufficiency: Secondary | ICD-10-CM | POA: Diagnosis not present

## 2016-06-04 DIAGNOSIS — N401 Enlarged prostate with lower urinary tract symptoms: Secondary | ICD-10-CM | POA: Diagnosis not present

## 2016-06-04 DIAGNOSIS — R351 Nocturia: Secondary | ICD-10-CM | POA: Diagnosis not present

## 2016-06-04 DIAGNOSIS — R972 Elevated prostate specific antigen [PSA]: Secondary | ICD-10-CM | POA: Diagnosis not present

## 2016-06-05 DIAGNOSIS — D0439 Carcinoma in situ of skin of other parts of face: Secondary | ICD-10-CM | POA: Diagnosis not present

## 2016-06-25 DIAGNOSIS — M47812 Spondylosis without myelopathy or radiculopathy, cervical region: Secondary | ICD-10-CM | POA: Diagnosis not present

## 2016-06-25 DIAGNOSIS — G5603 Carpal tunnel syndrome, bilateral upper limbs: Secondary | ICD-10-CM | POA: Diagnosis not present

## 2016-06-25 DIAGNOSIS — G5623 Lesion of ulnar nerve, bilateral upper limbs: Secondary | ICD-10-CM | POA: Diagnosis not present

## 2016-08-07 NOTE — Progress Notes (Signed)
Office Visit Note  Patient: Barry Zavala             Date of Birth: 1940-05-06           MRN: [id number]             PCP: Barry Huddle, MD Referring: Barry Huddle, MD Visit Date: 08/13/2016 Occupation: _0 @    Subjective:  Weakness and hand muscles.   History of Present Illness: Barry Zavala is a 76 y.o. 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 He went to [facility] for neurology visit. He states his thorn neurological examination was within normal limits. The neurologist agreed with the cubital and carpal tunnel release. He is scheduled to have surgery sometimes and November as he has some travel plans. He denies any joint swelling. He continues to have some weakness in his hands. 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 He denies any muscle weakness.  Activities of Daily Living:  Patient reports morning stiffness for minute.   Patient Denies nocturnal pain.  Difficulty dressing/grooming: Denies Difficulty climbing stairs: Denies Difficulty getting out of chair: Denies Difficulty using hands for taps, buttons, cutlery, and/or writing: Reports   Review of Systems  Constitutional: Negative for fatigue, night sweats and weakness ( ).  HENT: Negative for mouth sores, mouth dryness and nose dryness.   Eyes: Negative for redness and dryness.  Respiratory: Negative for shortness of breath and difficulty breathing.   Cardiovascular: Negative for chest pain, palpitations, hypertension, irregular heartbeat and swelling in legs/feet.  Gastrointestinal: Negative for constipation and diarrhea.  Endocrine: Negative for increased urination.  Musculoskeletal: Positive for arthralgias and joint pain. Negative for joint swelling, myalgias, muscle weakness, morning stiffness, muscle tenderness and myalgias.  Skin: Negative for color change, rash, hair loss, nodules/bumps, skin tightness, ulcers and sensitivity to  sunlight.  Allergic/Immunologic: Negative for susceptible to infections.  Neurological: Negative for dizziness, fainting, memory loss and night sweats.       Muscle weakness in hands  Hematological: Negative for swollen glands.  Psychiatric/Behavioral: Negative for depressed mood and sleep disturbance. The patient is not nervous/anxious.     PMFS History:  Patient Active Problem List   Diagnosis Date Noted  . DJD (degenerative joint disease), cervical 01/30/2016  . Elevated CPK 12/27/2015  . 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 History of diabetes mellitus 12/27/2015  . History of hypertension 12/27/2015  . Stage 3 chronic kidney disease 12/27/2015  . Heart murmur, systolic 98/26/4158  . Left carotid bruit 01/31/2015  . Hypertension   . Hyperlipidemia   . Type 2 diabetes mellitus ([place])   . Right bundle branch block   . Coronary artery disease   . Obesity     Past Medical History:  Diagnosis Date  . Anemia   . Coronary artery disease   . Elevated PSA   . Erectile dysfunction   . Hyperlipidemia   . Hypertension   . Irritable bowel syndrome   . Obesity   . Onychomycosis   . Palmar fibromatosis   . Right bundle branch block   . Type 2 diabetes mellitus ([facility])     Family History  Problem Relation Age of Onset  . Ovarian cancer Mother 36  . Heart attack Father   . Cancer Paternal Uncle        Karposi Sarcoma  . Multiple myeloma Sister 74   Past Surgical History:  Procedure Laterality Date  . APPENDECTOMY    .  cardiac stents     Social History   Social History Narrative  . No narrative on file     Objective: Vital Signs: BP (!) 111/54 (BP Location: Left Arm, Patient Position: Sitting, Cuff Size: Normal)   Pulse 70   Ht 5' 11" (1.803 m)   Wt 195 lb (88.5 kg)   BMI 27.20 kg/m    Physical Exam  Constitutional: He is oriented to person, place, and time. He appears well-developed and well-nourished.  HENT:  Head: Normocephalic and atraumatic.  Eyes:  Pupils are equal, round, and reactive to light. Conjunctivae and EOM are normal.  Neck: Normal range of motion. Neck supple.  Cardiovascular: Normal rate and regular rhythm.   Murmur heard. Pulmonary/Chest: Effort normal and breath sounds normal.  Abdominal: Soft. Bowel sounds are normal.  Neurological: He is alert and oriented to person, place, and time.  Muscle atrophy noted in his hands.  Skin: Skin is warm and dry. Capillary refill takes less than 2 seconds.  Psychiatric: He has a normal mood and affect. His behavior is normal.  Nursing note and vitals reviewed.    Musculoskeletal Exam: C-spine limited range of motion. He is some thoracic kyphosis. Shoulder joints elbow joints wrist joints are good range of motion. He has DIP PIP thickening in his hands consistent with an underlying osteoarthritis. Hip joints knee joints ankles MTPs PIPs with good range of motion.  CDAI Exam: No CDAI exam completed.    Investigation: Findings:  12/06/2015 CBC with Diff normal, CPK elevated 268, and Sed rate Normal 16, 04/03/2015 hemoglobin A1c 8.5, BMP creatinine 1.5 GFR 46, CBC normal, LFTs normal, TSH 2.79 normal, vitamin D 41.8 normal, lipid panel normal     Imaging: No results found.  Speciality Comments: No specialty comments available.    Procedures:  No procedures performed Allergies: Patient has no known allergies.   Assessment / Plan:     Visit Diagnoses: Elevated CPK: He had only mildy elevated CK.  DJD (degenerative joint disease), cervical: He continues to have some stiffness and decreased range of motion.  Muscle atrophy of bilateral hands and forearms - EMG done by Dr. 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 He is a schedule to have bilateral carpal tunnel release and cubital tunnel release in future. He plans to do surgery after his travel plans. I had detailed discussion regarding and muscle strengthening exercises. Some of the  exercises were demonstrated in the office and a handout was given.  Bilateral carpal tunnel syndrome - Dr Barry Zavala   Cubital tunnel syndrome, bilateral  Other medical problems are listed as follows:  History of diabetes mellitus  History of chronic kidney disease  History of hypertension  History of hyperlipidemia  History of right bundle branch block  History of coronary artery disease/ History of left carotid bruit     Orders: No orders of the defined types were placed in this encounter.  No orders of the defined types were placed in this encounter.     Follow-Up Instructions: Return if symptoms worsen or fail to improve, for Osteoarthritis.   Bo Merino, MD  Note - This record has been created using Editor, commissioning.  Chart creation errors have been sought, but may not always  have been located. Such creation errors do not reflect on  the standard of medical care.

## 2016-08-13 ENCOUNTER — Ambulatory Visit (INDEPENDENT_AMBULATORY_CARE_PROVIDER_SITE_OTHER): Payer: Medicare Other | Admitting: Rheumatology

## 2016-08-13 ENCOUNTER — Encounter: Payer: Self-pay | Admitting: Rheumatology

## 2016-08-13 VITALS — BP 111/54 | HR 70 | Ht 71.0 in | Wt 195.0 lb

## 2016-08-13 DIAGNOSIS — M62541 Muscle wasting and atrophy, not elsewhere classified, right hand: Secondary | ICD-10-CM | POA: Diagnosis not present

## 2016-08-13 DIAGNOSIS — G5603 Carpal tunnel syndrome, bilateral upper limbs: Secondary | ICD-10-CM

## 2016-08-13 DIAGNOSIS — R748 Abnormal levels of other serum enzymes: Secondary | ICD-10-CM

## 2016-08-13 DIAGNOSIS — Z87448 Personal history of other diseases of urinary system: Secondary | ICD-10-CM | POA: Diagnosis not present

## 2016-08-13 DIAGNOSIS — G5623 Lesion of ulnar nerve, bilateral upper limbs: Secondary | ICD-10-CM

## 2016-08-13 DIAGNOSIS — M5030 Other cervical disc degeneration, unspecified cervical region: Secondary | ICD-10-CM | POA: Diagnosis not present

## 2016-08-13 DIAGNOSIS — Z8679 Personal history of other diseases of the circulatory system: Secondary | ICD-10-CM

## 2016-08-13 DIAGNOSIS — M47812 Spondylosis without myelopathy or radiculopathy, cervical region: Secondary | ICD-10-CM

## 2016-08-13 DIAGNOSIS — Z8639 Personal history of other endocrine, nutritional and metabolic disease: Secondary | ICD-10-CM | POA: Diagnosis not present

## 2016-08-13 DIAGNOSIS — I2510 Atherosclerotic heart disease of native coronary artery without angina pectoris: Secondary | ICD-10-CM

## 2016-08-13 NOTE — Patient Instructions (Signed)

## 2016-08-25 DIAGNOSIS — Z961 Presence of intraocular lens: Secondary | ICD-10-CM | POA: Diagnosis not present

## 2016-09-17 DIAGNOSIS — Z6826 Body mass index (BMI) 26.0-26.9, adult: Secondary | ICD-10-CM | POA: Diagnosis not present

## 2016-09-17 DIAGNOSIS — E1121 Type 2 diabetes mellitus with diabetic nephropathy: Secondary | ICD-10-CM | POA: Diagnosis not present

## 2016-09-17 DIAGNOSIS — Z23 Encounter for immunization: Secondary | ICD-10-CM | POA: Diagnosis not present

## 2016-09-17 DIAGNOSIS — N08 Glomerular disorders in diseases classified elsewhere: Secondary | ICD-10-CM | POA: Diagnosis not present

## 2016-09-17 DIAGNOSIS — E784 Other hyperlipidemia: Secondary | ICD-10-CM | POA: Diagnosis not present

## 2016-09-17 DIAGNOSIS — I779 Disorder of arteries and arterioles, unspecified: Secondary | ICD-10-CM | POA: Diagnosis not present

## 2016-09-17 DIAGNOSIS — I2510 Atherosclerotic heart disease of native coronary artery without angina pectoris: Secondary | ICD-10-CM | POA: Diagnosis not present

## 2016-09-17 DIAGNOSIS — Z1389 Encounter for screening for other disorder: Secondary | ICD-10-CM | POA: Diagnosis not present

## 2016-09-17 DIAGNOSIS — G5600 Carpal tunnel syndrome, unspecified upper limb: Secondary | ICD-10-CM | POA: Diagnosis not present

## 2016-09-17 DIAGNOSIS — I10 Essential (primary) hypertension: Secondary | ICD-10-CM | POA: Diagnosis not present

## 2016-10-20 DIAGNOSIS — N5201 Erectile dysfunction due to arterial insufficiency: Secondary | ICD-10-CM | POA: Diagnosis not present

## 2016-10-20 DIAGNOSIS — E119 Type 2 diabetes mellitus without complications: Secondary | ICD-10-CM | POA: Diagnosis not present

## 2016-10-20 DIAGNOSIS — E782 Mixed hyperlipidemia: Secondary | ICD-10-CM | POA: Diagnosis not present

## 2016-10-20 DIAGNOSIS — I2510 Atherosclerotic heart disease of native coronary artery without angina pectoris: Secondary | ICD-10-CM | POA: Diagnosis not present

## 2016-10-20 DIAGNOSIS — Z79899 Other long term (current) drug therapy: Secondary | ICD-10-CM | POA: Diagnosis not present

## 2016-10-20 DIAGNOSIS — R0989 Other specified symptoms and signs involving the circulatory and respiratory systems: Secondary | ICD-10-CM | POA: Diagnosis not present

## 2016-10-20 DIAGNOSIS — E559 Vitamin D deficiency, unspecified: Secondary | ICD-10-CM | POA: Diagnosis not present

## 2016-10-20 DIAGNOSIS — I450 Right fascicular block: Secondary | ICD-10-CM | POA: Diagnosis not present

## 2016-10-20 DIAGNOSIS — I10 Essential (primary) hypertension: Secondary | ICD-10-CM | POA: Diagnosis not present

## 2016-10-20 DIAGNOSIS — N183 Chronic kidney disease, stage 3 (moderate): Secondary | ICD-10-CM | POA: Diagnosis not present

## 2016-10-20 DIAGNOSIS — R972 Elevated prostate specific antigen [PSA]: Secondary | ICD-10-CM | POA: Diagnosis not present

## 2016-11-09 IMAGING — NM NM MISC PROCEDURE
3 series · 18 of 18 positions shown · non-contrast
Comparison: none

[Series 1: stress-gsp_(id)_sa · 6.4mm · 6.40mm/px · 6 of 512 frames shown]
[frame 43/512]
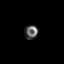
[frame 128/512]
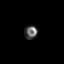
[frame 214/512]
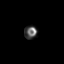
[frame 299/512]
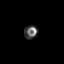
[frame 384/512]
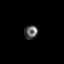
[frame 470/512]
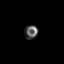

[Series 1: rest_(id)_sa · 6.4mm · 6.40mm/px · 6 of 64 frames shown]
[frame 6/64]
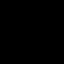
[frame 16/64]
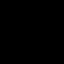
[frame 27/64]
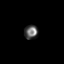
[frame 38/64]
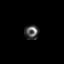
[frame 48/64]
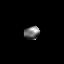
[frame 59/64]
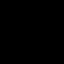

[Series 1: stress-sum-em_(id)_sa · 6.4mm · 6.40mm/px · 6 of 64 frames shown]
[frame 6/64]
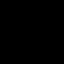
[frame 16/64]
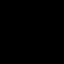
[frame 27/64]
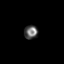
[frame 38/64]
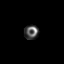
[frame 48/64]
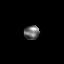
[frame 59/64]
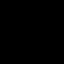

[18 of 18 positions shown; findings below may reference images not displayed]

Canned report from images found in remote index.

Refer to host system for actual result text.

## 2016-11-10 ENCOUNTER — Other Ambulatory Visit: Payer: Self-pay | Admitting: Orthopedic Surgery

## 2016-11-10 ENCOUNTER — Telehealth: Payer: Self-pay | Admitting: Interventional Cardiology

## 2016-11-10 NOTE — Telephone Encounter (Signed)
° °  [facility] Pre-operative Risk Assessment    Request for surgical clearance:  1. What type of surgery is being performed? Decompression possible transposition left ulnar nerve, left carpal tunnel release   2. When is this surgery scheduled? 12/13/2016  3. Are there any medications that need to be held prior to surgery and how long? Please advise if there is any medications that need to be held and how long.    4. Practice name and name of physician performing surgery? [facility], Dr. Daryll Brod   5. What is your office phone and fax number? [place]: [phone], FAX: [phone] ATTN: Cyndee Brightly, RN  6. Anesthesia type (None, local, MAC, general) ? Axillary Block    Barry Zavala 11/10/2016, 1:17 PM  _________________________________________________________________   (provider comments below)

## 2016-11-13 NOTE — Telephone Encounter (Signed)
Reached out to pt re: cardiac clearance.  Pt will need an appt before he can be cleared, with Dr. Tamala Julian or his care team. Left message for pt to call back.

## 2016-11-13 NOTE — Telephone Encounter (Signed)
    Chart reviewed as part of pre-operative protocol coverage. Because of Duan Scharnhorst past medical history and time since last visit, he/she will require a follow-up visit in order to better assess preoperative cardiovascular risk. Last seen by Dr. Tamala Julian 02/01/16.  Pre-op covering staff: - Please schedule appointment and call patient to inform them. - Please contact requesting surgeon's office via preferred method (i.e, phone, fax) to inform them of need for appointment prior to surgery.  [place], PA  11/13/2016, 2:06 PM

## 2016-11-17 NOTE — Telephone Encounter (Signed)
2nd attempt to reach pt re: preop clearance Left a message for pt to call back.

## 2016-11-18 NOTE — Telephone Encounter (Signed)
3rd attempt to contact patient, left message for patient to return call.

## 2016-11-19 NOTE — Telephone Encounter (Signed)
Called pt and asked if he would like a sooner appt. Advised pt I could have him see Kerin Ransom, PA at our [place] office 11/21/16. Pt agreeable to date and time change. I will let surgeon office know as well of appt moved up.

## 2016-11-19 NOTE — Telephone Encounter (Signed)
Per Pre-Op protocol: Pt was advised he needed an appt with our office before he could be cleared for his surgery. Pt is agreeable. Pt has been scheduled to see Cecilie Kicks, NP 12/04/16 @ 11 am. Pt is agreeable to date and time for appt. I will contact Dr. Levell July office to advise pt is schedule for appt in our office for his clearance.   I s/w Dr. Levell July office and will fax over information at this point that pt will will need an appt with his Cardiologist before he can be cleared. Fax # [phone]

## 2016-11-21 ENCOUNTER — Ambulatory Visit (INDEPENDENT_AMBULATORY_CARE_PROVIDER_SITE_OTHER): Payer: Medicare Other | Admitting: Cardiology

## 2016-11-21 ENCOUNTER — Encounter: Payer: Self-pay | Admitting: Cardiology

## 2016-11-21 DIAGNOSIS — N183 Chronic kidney disease, stage 3 unspecified: Secondary | ICD-10-CM

## 2016-11-21 DIAGNOSIS — I2510 Atherosclerotic heart disease of native coronary artery without angina pectoris: Secondary | ICD-10-CM | POA: Diagnosis not present

## 2016-11-21 DIAGNOSIS — Z01810 Encounter for preprocedural cardiovascular examination: Secondary | ICD-10-CM | POA: Diagnosis not present

## 2016-11-21 DIAGNOSIS — Z9861 Coronary angioplasty status: Secondary | ICD-10-CM | POA: Diagnosis not present

## 2016-11-21 DIAGNOSIS — E119 Type 2 diabetes mellitus without complications: Secondary | ICD-10-CM | POA: Diagnosis not present

## 2016-11-21 DIAGNOSIS — I10 Essential (primary) hypertension: Secondary | ICD-10-CM | POA: Diagnosis not present

## 2016-11-21 DIAGNOSIS — E782 Mixed hyperlipidemia: Secondary | ICD-10-CM | POA: Diagnosis not present

## 2016-11-21 DIAGNOSIS — I4510 Unspecified right bundle-branch block: Secondary | ICD-10-CM | POA: Diagnosis not present

## 2016-11-21 DIAGNOSIS — R0989 Other specified symptoms and signs involving the circulatory and respiratory systems: Secondary | ICD-10-CM

## 2016-11-21 NOTE — Assessment & Plan Note (Signed)
SCr was 1.56 in Oct 2018 ( [place])

## 2016-11-21 NOTE — Progress Notes (Addendum)
11/21/2016 Barry Zavala   1940-03-18  [id number]  Primary Physician Josetta Huddle, MD Primary Cardiologist: Dr Tamala Julian  HPI:  76 y/o male followed by Dr Tamala Julian with a history of remote MI in 1995, and PCI in 2001. Myoview in Feb 2017 was low risk. He last saw Dr Tamala Julian in jan 2018. Other medical problems include NIDDM, HTN, HLD, CRI, and RBBB. He is in the office today for pre operative clearance prior to carpal tunnel release and ulnar nerve surgery on both arms as a staged procedure. The pt denies any unusual DOA or chest pain. recently he was in [place] and was able to walk up and down hills without problems.    Current Outpatient Medications  Medication Sig Dispense Refill  . aspirin EC 81 MG tablet Take 1 tablet (81 mg total) by mouth daily. 90 tablet 3  . cholecalciferol (VITAMIN D) 1000 UNITS tablet Take 2,000 Units by mouth daily.    . Empagliflozin (JARDIANCE) 10 MG TABS Take by mouth.    . ezetimibe-simvastatin (VYTORIN) 10-20 MG tablet Take 1 tablet by mouth daily. 90 tablet 3  . glyBURIDE-metformin (GLUCOVANCE) 5-500 MG per tablet Take 2 tablets by mouth 2 (two) times daily with a meal.    . metoprolol succinate (TOPROL-XL) 50 MG 24 hr tablet Take 50 mg by mouth daily. Take with or immediately following a meal.    . Multiple Vitamins-Minerals (CENTRUM SILVER PO) Take 1 tablet by mouth daily.    Marland Kitchen omega-3 acid ethyl esters (LOVAZA) 1 G capsule Take 1 g by mouth 3 (three) times daily.    Marland Kitchen omeprazole (PRILOSEC) 20 MG capsule Take 20 mg by mouth daily.    . ONGLYZA 5 MG TABS tablet Take 5 mg by mouth daily.   2  . pioglitazone (ACTOS) 45 MG tablet Take 45 mg by mouth daily.    . ramipril (ALTACE) 10 MG capsule Take 10 mg by mouth daily.     No current facility-administered medications for this visit.     Allergies  Allergen Reactions  . Other Other (See Comments)    Agent: Vit C,e,zn,cu-omega3-lut-zeax Reaction: GI upset    Past Medical History:  Diagnosis Date  .  Anemia   . Coronary artery disease   . Elevated PSA   . Erectile dysfunction   . Hyperlipidemia   . Hypertension   . Irritable bowel syndrome   . Obesity   . Onychomycosis   . Palmar fibromatosis   . Right bundle branch block   . Type 2 diabetes mellitus ([place])     Social History   Socioeconomic History  . Marital status: Married    Spouse name: Not on file  . Number of children: 2  . Years of education: Not on file  . Highest education level: Not on file  Social Needs  . Financial resource strain: Not on file  . Food insecurity - worry: Not on file  . Food insecurity - inability: Not on file  . Transportation needs - medical: Not on file  . Transportation needs - non-medical: Not on file  Occupational History  . Not on file  Tobacco Use  . Smoking status: Former Smoker    Packs/day: 1.00    Years: 20.00    Pack years: 20.00    Types: Cigarettes    Last attempt to quit: 1988    Years since quitting: 30.8  . Smokeless tobacco: Never Used  Substance and Sexual Activity  . Alcohol use: No  Alcohol/week: 0.0 oz  . Drug use: No  . Sexual activity: Not on file  Other Topics Concern  . Not on file  Social History Narrative  . Not on file     Family History  Problem Relation Age of Onset  . Ovarian cancer Mother 4  . Heart attack Father   . Cancer Paternal Uncle        Karposi Sarcoma  . Multiple myeloma Sister 75     Review of Systems: General: negative for chills, fever, night sweats or weight changes.  Cardiovascular: negative for chest pain, dyspnea on exertion, edema, orthopnea, palpitations, paroxysmal nocturnal dyspnea or shortness of breath Dermatological: negative for rash Respiratory: negative for cough or wheezing Urologic: negative for hematuria Abdominal: negative for nausea, vomiting, diarrhea, bright red blood per rectum, melena, or hematemesis Neurologic: negative for visual changes, syncope, or dizziness All other systems reviewed and are  otherwise negative except as noted above.    Blood pressure 121/67, pulse 60, height 5' 11.5" (1.816 m), weight 190 lb (86.2 kg).  General appearance: alert, cooperative and no distress Neck: no JVD and LCA bruit Lungs: clear to auscultation bilaterally Heart: regular rate and rhythm Extremities: bilater thenar eminence wasting Skin: Skin color, texture, turgor normal. No rashes or lesions Neurologic: Grossly normal  EKG NSR, chronic RBBB  ASSESSMENT AND PLAN:   Essential hypertension Controlled  CAD S/P percutaneous coronary angioplasty  Myocardial infarction 1995 treated with PCI. 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    Kerin Ransom PA-C 11/21/2016 1:27 PM

## 2016-11-21 NOTE — Assessment & Plan Note (Signed)
Moderate carotid disease (< 50%) by doppler April 2018

## 2016-11-21 NOTE — Assessment & Plan Note (Signed)
Myocardial infarction 1995 treated with PCI. Repeat PCI 2001 Myoview low risk Feb 2017

## 2016-11-21 NOTE — Assessment & Plan Note (Signed)
Followed by PCP

## 2016-11-21 NOTE — Assessment & Plan Note (Signed)
Pre op clearance for carpal tunnel release by Dr Fredna Dow

## 2016-11-21 NOTE — Patient Instructions (Addendum)
Medication Instructions:  1. Your physician recommends that you continue on your current medications as directed. Please refer to the Current Medication list given to you today.   Labwork: NONE ORDERED TODAY  Testing/Procedures: NONE ORDERED TODAY  Follow-Up: YOU HAVE AN APPOINTMENT WITH DR. Tamala Julian 02/02/17 @ 8:20 AM  Any Other Special Instructions Will Be Listed Below (If Applicable). YOU HAVE BEEN CLEARED FOR YOUR SURGERY; WE WILL LET DR. [place]     If you need a refill on your cardiac medications before your next appointment, please call your pharmacy.

## 2016-11-21 NOTE — Telephone Encounter (Signed)
Patient see by Kerin Ransom, PA and clearance was addressed in office visit note. Routed PA note to Dr. Fredna Dow via Epic route feature.

## 2016-11-21 NOTE — Assessment & Plan Note (Signed)
Controlled.  

## 2016-11-21 NOTE — Assessment & Plan Note (Signed)
Chronic. 

## 2016-12-03 DIAGNOSIS — Z85828 Personal history of other malignant neoplasm of skin: Secondary | ICD-10-CM | POA: Diagnosis not present

## 2016-12-03 DIAGNOSIS — L821 Other seborrheic keratosis: Secondary | ICD-10-CM | POA: Diagnosis not present

## 2016-12-03 DIAGNOSIS — D229 Melanocytic nevi, unspecified: Secondary | ICD-10-CM | POA: Diagnosis not present

## 2016-12-04 ENCOUNTER — Encounter (HOSPITAL_BASED_OUTPATIENT_CLINIC_OR_DEPARTMENT_OTHER): Payer: Self-pay | Admitting: *Deleted

## 2016-12-04 ENCOUNTER — Ambulatory Visit: Payer: Medicare Other | Admitting: Cardiology

## 2016-12-04 ENCOUNTER — Encounter (HOSPITAL_BASED_OUTPATIENT_CLINIC_OR_DEPARTMENT_OTHER)
Admission: RE | Admit: 2016-12-04 | Discharge: 2016-12-04 | Disposition: A | Discharge status: Home / Self Care | Payer: Medicare Other | Source: Ambulatory Visit | Attending: Orthopedic Surgery | Admitting: Orthopedic Surgery

## 2016-12-04 DIAGNOSIS — Z01812 Encounter for preprocedural laboratory examination: Secondary | ICD-10-CM | POA: Insufficient documentation

## 2016-12-04 LAB — BASIC METABOLIC PANEL
ANION GAP: 5 (ref 5–15)
BUN: 33 mg/dL — AB (ref 6–20)
CHLORIDE: 108 mmol/L (ref 101–111)
CO2: 25 mmol/L (ref 22–32)
Calcium: 9.3 mg/dL (ref 8.9–10.3)
Creatinine, Ser: 1.59 mg/dL — ABNORMAL HIGH (ref 0.61–1.24)
GFR calc Af Amer: 47 mL/min — ABNORMAL LOW (ref >60)
GFR, EST NON AFRICAN AMERICAN: 41 mL/min — AB (ref >60)
GLUCOSE: 197 mg/dL — AB (ref 65–99)
POTASSIUM: 4.8 mmol/L (ref 3.5–5.1)
Sodium: 138 mmol/L (ref 135–145)

## 2016-12-05 NOTE — Progress Notes (Signed)
Lab results reviewed by Dr. Hollis, will proceed with surgery as scheduled. 

## 2016-12-08 NOTE — Anesthesia Preprocedure Evaluation (Addendum)
Anesthesia Evaluation  Patient identified by MRN, date of birth, ID band Patient awake    Reviewed: Allergy & Precautions, NPO status , Patient's Chart, lab work & pertinent test results  Airway Mallampati: II  TM Distance: >3 FB Neck ROM: Full    Dental  (+) Dental Advisory Given   Pulmonary former smoker,    breath sounds clear to auscultation       Cardiovascular hypertension, Pt. on medications and Pt. on home beta blockers + CAD and + Cardiac Stents  + dysrhythmias + Valvular Problems/Murmurs  Rhythm:Regular Rate:Normal  Low risk myoview 02/2015   Neuro/Psych  Neuromuscular disease    GI/Hepatic Neg liver ROS, GERD  ,  Endo/Other  diabetes, Type 2  Renal/GU CRFRenal disease     Musculoskeletal   Abdominal   Peds  Hematology  (+) anemia ,   Anesthesia Other Findings   Reproductive/Obstetrics                            Lab Results  Component Value Date   WBC 8.9 05/01/2009   HGB 12.4 (L) 05/01/2009   HCT 35.2 (L) 05/01/2009   MCV 92.8 05/01/2009   PLT 249 05/01/2009   Lab Results  Component Value Date   CREATININE 1.59 (H) 12/04/2016   BUN 33 (H) 12/04/2016   NA 138 12/04/2016   K 4.8 12/04/2016   CL 108 12/04/2016   CO2 25 12/04/2016    Anesthesia Physical Anesthesia Plan  ASA: III  Anesthesia Plan: General   Post-op Pain Management:    Induction: Intravenous  PONV Risk Score and Plan: 2 and Dexamethasone, Ondansetron and Treatment may vary due to age or medical condition  Airway Management Planned: LMA  Additional Equipment:   Intra-op Plan:   Post-operative Plan: Extubation in OR  Informed Consent: I have reviewed the patients History and Physical, chart, labs and discussed the procedure including the risks, benefits and alternatives for the proposed anesthesia with the patient or authorized representative who has indicated his/her understanding and  acceptance.   Dental advisory given  Plan Discussed with: CRNA  Anesthesia Plan Comments:        Anesthesia Quick Evaluation

## 2016-12-09 ENCOUNTER — Ambulatory Visit (HOSPITAL_BASED_OUTPATIENT_CLINIC_OR_DEPARTMENT_OTHER): Payer: Medicare Other | Admitting: Anesthesiology

## 2016-12-09 ENCOUNTER — Other Ambulatory Visit: Payer: Self-pay

## 2016-12-09 ENCOUNTER — Ambulatory Visit (HOSPITAL_BASED_OUTPATIENT_CLINIC_OR_DEPARTMENT_OTHER)
Admission: RE | Admit: 2016-12-09 | Discharge: 2016-12-09 | Disposition: A | Discharge status: Home / Self Care | Payer: Medicare Other | Source: Ambulatory Visit | Attending: Orthopedic Surgery | Admitting: Orthopedic Surgery

## 2016-12-09 ENCOUNTER — Encounter (HOSPITAL_BASED_OUTPATIENT_CLINIC_OR_DEPARTMENT_OTHER)
Admission: RE | Disposition: A | Discharge status: Home / Self Care | Payer: Self-pay | Source: Ambulatory Visit | Attending: Orthopedic Surgery

## 2016-12-09 ENCOUNTER — Encounter (HOSPITAL_BASED_OUTPATIENT_CLINIC_OR_DEPARTMENT_OTHER): Payer: Self-pay | Admitting: *Deleted

## 2016-12-09 DIAGNOSIS — E119 Type 2 diabetes mellitus without complications: Secondary | ICD-10-CM | POA: Diagnosis not present

## 2016-12-09 DIAGNOSIS — Z87891 Personal history of nicotine dependence: Secondary | ICD-10-CM | POA: Insufficient documentation

## 2016-12-09 DIAGNOSIS — G5622 Lesion of ulnar nerve, left upper limb: Secondary | ICD-10-CM | POA: Insufficient documentation

## 2016-12-09 DIAGNOSIS — Z6825 Body mass index (BMI) 25.0-25.9, adult: Secondary | ICD-10-CM | POA: Diagnosis not present

## 2016-12-09 DIAGNOSIS — G5602 Carpal tunnel syndrome, left upper limb: Secondary | ICD-10-CM | POA: Diagnosis not present

## 2016-12-09 DIAGNOSIS — E669 Obesity, unspecified: Secondary | ICD-10-CM | POA: Diagnosis not present

## 2016-12-09 DIAGNOSIS — M47812 Spondylosis without myelopathy or radiculopathy, cervical region: Secondary | ICD-10-CM | POA: Insufficient documentation

## 2016-12-09 DIAGNOSIS — I2510 Atherosclerotic heart disease of native coronary artery without angina pectoris: Secondary | ICD-10-CM | POA: Diagnosis not present

## 2016-12-09 DIAGNOSIS — N183 Chronic kidney disease, stage 3 (moderate): Secondary | ICD-10-CM | POA: Diagnosis not present

## 2016-12-09 DIAGNOSIS — I129 Hypertensive chronic kidney disease with stage 1 through stage 4 chronic kidney disease, or unspecified chronic kidney disease: Secondary | ICD-10-CM | POA: Diagnosis not present

## 2016-12-09 HISTORY — PX: CARPAL TUNNEL RELEASE: SHX101

## 2016-12-09 HISTORY — PX: ULNAR NERVE TRANSPOSITION: SHX2595

## 2016-12-09 LAB — GLUCOSE, CAPILLARY
GLUCOSE-CAPILLARY: 132 mg/dL — AB (ref 65–99)
Glucose-Capillary: 135 mg/dL — ABNORMAL HIGH (ref 65–99)

## 2016-12-09 SURGERY — ULNAR NERVE DECOMPRESSION/TRANSPOSITION
Anesthesia: General | Site: Wrist | Laterality: Left

## 2016-12-09 MED ORDER — LIDOCAINE 2% (20 MG/ML) 5 ML SYRINGE
INTRAMUSCULAR | Status: AC
  Filled 2016-12-09: qty 5

## 2016-12-09 MED ORDER — PROPOFOL 500 MG/50ML IV EMUL
INTRAVENOUS | Status: AC
  Filled 2016-12-09: qty 50

## 2016-12-09 MED ORDER — FENTANYL CITRATE (PF) 100 MCG/2ML IJ SOLN: 25.0000 ug | INTRAMUSCULAR | Status: DC | PRN

## 2016-12-09 MED ORDER — PROPOFOL 10 MG/ML IV BOLUS
INTRAVENOUS | Status: DC | PRN
  Administered 2016-12-09: 150 mg via INTRAVENOUS

## 2016-12-09 MED ORDER — BUPIVACAINE HCL (PF) 0.25 % IJ SOLN
INTRAMUSCULAR | Status: AC
  Filled 2016-12-09: qty 150

## 2016-12-09 MED ORDER — FENTANYL CITRATE (PF) 100 MCG/2ML IJ SOLN
50.0000 ug | INTRAMUSCULAR | Status: DC | PRN
  Administered 2016-12-09: 50 ug via INTRAVENOUS

## 2016-12-09 MED ORDER — MIDAZOLAM HCL 2 MG/2ML IJ SOLN
INTRAMUSCULAR | Status: AC
  Filled 2016-12-09: qty 2

## 2016-12-09 MED ORDER — SUCCINYLCHOLINE CHLORIDE 200 MG/10ML IV SOSY
PREFILLED_SYRINGE | INTRAVENOUS | Status: AC
  Filled 2016-12-09: qty 10

## 2016-12-09 MED ORDER — DEXAMETHASONE SODIUM PHOSPHATE 10 MG/ML IJ SOLN
INTRAMUSCULAR | Status: AC
  Filled 2016-12-09: qty 1

## 2016-12-09 MED ORDER — LACTATED RINGERS IV SOLN
INTRAVENOUS | Status: DC
  Administered 2016-12-09: 08:00:00 via INTRAVENOUS

## 2016-12-09 MED ORDER — ONDANSETRON HCL 4 MG/2ML IJ SOLN
INTRAMUSCULAR | Status: DC | PRN
  Administered 2016-12-09: 4 mg via INTRAVENOUS

## 2016-12-09 MED ORDER — CEFAZOLIN SODIUM-DEXTROSE 2-4 GM/100ML-% IV SOLN
INTRAVENOUS | Status: AC
  Filled 2016-12-09: qty 100

## 2016-12-09 MED ORDER — CEFAZOLIN SODIUM-DEXTROSE 2-4 GM/100ML-% IV SOLN
2.0000 g | INTRAVENOUS | Status: AC
  Administered 2016-12-09: 2 g via INTRAVENOUS

## 2016-12-09 MED ORDER — PHENYLEPHRINE 40 MCG/ML (10ML) SYRINGE FOR IV PUSH (FOR BLOOD PRESSURE SUPPORT)
PREFILLED_SYRINGE | INTRAVENOUS | Status: AC
  Filled 2016-12-09: qty 10

## 2016-12-09 MED ORDER — ONDANSETRON HCL 4 MG/2ML IJ SOLN
INTRAMUSCULAR | Status: AC
  Filled 2016-12-09: qty 2

## 2016-12-09 MED ORDER — BUPIVACAINE HCL (PF) 0.25 % IJ SOLN
INTRAMUSCULAR | Status: DC | PRN
  Administered 2016-12-09: 10 mL

## 2016-12-09 MED ORDER — EPHEDRINE 5 MG/ML INJ
INTRAVENOUS | Status: AC
  Filled 2016-12-09: qty 10

## 2016-12-09 MED ORDER — FENTANYL CITRATE (PF) 100 MCG/2ML IJ SOLN
INTRAMUSCULAR | Status: AC
  Filled 2016-12-09: qty 2

## 2016-12-09 MED ORDER — HYDROCODONE-ACETAMINOPHEN 5-325 MG PO TABS: 1.0000 | ORAL_TABLET | Freq: Four times a day (QID) | ORAL | 0 refills | Status: DC | PRN

## 2016-12-09 MED ORDER — MIDAZOLAM HCL 2 MG/2ML IJ SOLN
1.0000 mg | INTRAMUSCULAR | Status: DC | PRN
  Administered 2016-12-09: 1 mg via INTRAVENOUS

## 2016-12-09 MED ORDER — SCOPOLAMINE 1 MG/3DAYS TD PT72
1.0000 | MEDICATED_PATCH | Freq: Once | TRANSDERMAL | Status: DC | PRN
Start: 2016-12-09 — End: 2016-12-09

## 2016-12-09 MED ORDER — CHLORHEXIDINE GLUCONATE 4 % EX LIQD: 60.0000 mL | Freq: Once | CUTANEOUS | Status: DC

## 2016-12-09 SURGICAL SUPPLY — 50 items
BLADE MINI RND TIP GREEN BEAV (BLADE) IMPLANT
BLADE SURG 15 STRL LF DISP TIS (BLADE) ×2 IMPLANT
BLADE SURG 15 STRL SS (BLADE) ×3
BNDG CMPR 9X4 STRL LF SNTH (GAUZE/BANDAGES/DRESSINGS) ×2
BNDG COHESIVE 3X5 TAN STRL LF (GAUZE/BANDAGES/DRESSINGS) ×6 IMPLANT
BNDG ESMARK 4X9 LF (GAUZE/BANDAGES/DRESSINGS) ×3 IMPLANT
BNDG GAUZE ELAST 4 BULKY (GAUZE/BANDAGES/DRESSINGS) ×3 IMPLANT
CHLORAPREP W/TINT 26ML (MISCELLANEOUS) ×3 IMPLANT
CORD BIPOLAR FORCEPS 12FT (ELECTRODE) ×3 IMPLANT
COVER BACK TABLE 60X90IN (DRAPES) ×3 IMPLANT
COVER MAYO STAND STRL (DRAPES) ×3 IMPLANT
CUFF TOURN SGL LL 18 NRW (TOURNIQUET CUFF) ×3 IMPLANT
CUFF TOURNIQUET SINGLE 18IN (TOURNIQUET CUFF) ×3 IMPLANT
DECANTER SPIKE VIAL GLASS SM (MISCELLANEOUS) IMPLANT
DRAPE EXTREMITY T 121X128X90 (DRAPE) ×3 IMPLANT
DRAPE SURG 17X23 STRL (DRAPES) ×3 IMPLANT
DRSG PAD ABDOMINAL 8X10 ST (GAUZE/BANDAGES/DRESSINGS) ×3 IMPLANT
GAUZE SPONGE 4X4 12PLY STRL (GAUZE/BANDAGES/DRESSINGS) ×3 IMPLANT
GAUZE SPONGE 4X4 16PLY XRAY LF (GAUZE/BANDAGES/DRESSINGS) IMPLANT
GAUZE XEROFORM 1X8 LF (GAUZE/BANDAGES/DRESSINGS) ×3 IMPLANT
GLOVE BIO SURGEON STRL SZ 6.5 (GLOVE) ×1 IMPLANT
GLOVE BIOGEL PI IND STRL 7.0 (GLOVE) IMPLANT
GLOVE BIOGEL PI IND STRL 8.5 (GLOVE) ×2 IMPLANT
GLOVE BIOGEL PI INDICATOR 7.0 (GLOVE) ×2
GLOVE BIOGEL PI INDICATOR 8.5 (GLOVE) ×1
GLOVE SURG ORTHO 8.0 STRL STRW (GLOVE) ×3 IMPLANT
GOWN STRL REUS W/ TWL LRG LVL3 (GOWN DISPOSABLE) ×2 IMPLANT
GOWN STRL REUS W/TWL LRG LVL3 (GOWN DISPOSABLE) ×3
GOWN STRL REUS W/TWL XL LVL3 (GOWN DISPOSABLE) ×3 IMPLANT
LOOP VESSEL MAXI BLUE (MISCELLANEOUS) IMPLANT
NDL PRECISIONGLIDE 27X1.5 (NEEDLE) IMPLANT
NEEDLE PRECISIONGLIDE 27X1.5 (NEEDLE) ×3 IMPLANT
NS IRRIG 1000ML POUR BTL (IV SOLUTION) ×3 IMPLANT
PACK BASIN DAY SURGERY FS (CUSTOM PROCEDURE TRAY) ×3 IMPLANT
PAD CAST 3X4 CTTN HI CHSV (CAST SUPPLIES) IMPLANT
PAD CAST 4YDX4 CTTN HI CHSV (CAST SUPPLIES) ×2 IMPLANT
PADDING CAST COTTON 3X4 STRL (CAST SUPPLIES)
PADDING CAST COTTON 4X4 STRL (CAST SUPPLIES) ×3
SLEEVE SCD COMPRESS KNEE MED (MISCELLANEOUS) ×3 IMPLANT
SPLINT PLASTER CAST XFAST 3X15 (CAST SUPPLIES) IMPLANT
SPLINT PLASTER XTRA FASTSET 3X (CAST SUPPLIES)
STOCKINETTE 4X48 STRL (DRAPES) ×3 IMPLANT
SUT ETHILON 4 0 PS 2 18 (SUTURE) ×3 IMPLANT
SUT VIC AB 2-0 SH 27 (SUTURE) ×3
SUT VIC AB 2-0 SH 27XBRD (SUTURE) ×2 IMPLANT
SUT VICRYL 4-0 PS2 18IN ABS (SUTURE) ×3 IMPLANT
SYR BULB 3OZ (MISCELLANEOUS) ×3 IMPLANT
SYR CONTROL 10ML LL (SYRINGE) ×1 IMPLANT
TOWEL OR 17X24 6PK STRL BLUE (TOWEL DISPOSABLE) ×3 IMPLANT
UNDERPAD 30X30 (UNDERPADS AND DIAPERS) ×3 IMPLANT

## 2016-12-09 NOTE — Discharge Instructions (Addendum)

## 2016-12-09 NOTE — Transfer of Care (Signed)
Immediate Anesthesia Transfer of Care Note  Patient: Barry Zavala  Procedure(s) Performed: DECOMPRESSION LEFT ULNAR NERVE (Left Elbow) CARPAL TUNNEL RELEASE (Left Wrist)  Patient Location: PACU  Anesthesia Type:General  Level of Consciousness: awake, alert  and drowsy  Airway & Oxygen Therapy: Patient Spontanous Breathing and Patient connected to face mask oxygen  Post-op Assessment: Report given to RN and Post -op Vital signs reviewed and stable  Post vital signs: Reviewed and stable  Last Vitals:  Vitals:   12/09/16 0721 12/09/16 0746  BP: 121/62 121/62  Pulse: 65 65  Resp: 18 18  Temp: 36.4 C 36.4 C  SpO2: 99% 99%    Last Pain:  Vitals:   12/09/16 0746  TempSrc: Oral         Complications: No apparent anesthesia complications

## 2016-12-09 NOTE — H&P (Signed)
Barry Zavala is an 76 y.o. male.   Chief Complaint: numbness left hand HPI: Barry Zavala is a 76 yo male with bilateral hand pain numbness and tingling. He complaines of mild discomfort. Has not significantly changed. He was referred to Dr. Thereasa Parkin for nerve conductions . He has been seen by Dr. Lorin Mercy for his neck and Dr. Estanislado Pandy. 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  Dr. Thereasa Parkin has suggested neurology consultation in that he does have diabetes.  Not complaining of any increased pain at the present time he continues to complain of some discomfort in his forearm on his right side.      Past Medical History:  Diagnosis Date  . Anemia   . Coronary artery disease   . Elevated PSA   . Erectile dysfunction   . Hyperlipidemia   . Hypertension   . Irritable bowel syndrome   . Obesity   . Onychomycosis   . Palmar fibromatosis   . Right bundle branch block   . Type 2 diabetes mellitus ([place])     Past Surgical History:  Procedure Laterality Date  . APPENDECTOMY    . cardiac stents      Family History  Problem Relation Age of Onset  . Ovarian cancer Mother 32  . Heart attack Father   . Cancer Paternal Uncle        [facility] Sar[facility]  . Multiple myeloma Sister 97   Social History:  reports that he quit smoking about 30 years ago. His smoking use included cigarettes. He has a 20.00 pack-year smoking history. he has never used smokeless tobacco. He reports that he does not drink alcohol or use drugs.  Allergies:  Allergies  Allergen Reactions  . Other Other (See Comments)    Agent: Vit C,e,zn,[id number]-[id number]-lut-zeax Reaction: GI upset    No medications prior to admission.    No results found for this or any previous visit (from the past 48 hour(s)).  No results found.   Pertinent items are noted in HPI.  Height 5'  11.5" (1.816 m), weight 82.6 kg (182 lb).  General appearance: alert, cooperative and appears stated age Head: Normocephalic, without obvious abnormality, atraumatic Neck: no adenopathy, no carotid bruit, no JVD, supple, symmetrical, trachea midline and thyroid not enlarged, symmetric, no tenderness/mass/nodules Resp: clear to auscultation bilaterally Cardio: regular rate and rhythm, S1, S2 normal, no murmur, click, rub or gallop GI: soft, non-tender; bowel sounds normal; no masses,  no organomegaly Extremities: numbness left hand Pulses: 2+ and symmetric Skin: Skin color, texture, turgor normal. No rashes or lesions Neurologic: Grossly normal Incision/Wound: na  Assessment/Plan Assessment:  1. Cubital tunnel syndrome, bilateral  2. Bilateral carpal tunnel syndrome  3. Cervical spondylosis without myelopathy    Plan: Recommend proceeding with carpal tunnel cubital tunnel releases bilaterally. Pre-peri-and postoperative course are discussed along with risk complications. He is aware that there is no guarantee to the surgery the possibility of infection recurrence injury to arteries nerves tendons incomplete relief symptoms and dystrophy. They are aware that we are going to attempt to halt the process hopefully allow it to get better. It is especially relevant to the ulnar nerve. Advised there is no guarantee to the surgery the possibility of infection recurrence injury to arteries nerves tendons complete relief symptoms dystrophy. To proceed. The would like to proceed to the left side. He is scheduled for  carpal tunnel cubital tunnel decompression possible transposition ulnar nerve at the elbow. His questions are encouraged and answered to their satisfaction. Scheduled for carpal tunnel cubital tunnel release and outpatient under regional anesthesia.      Barry Zavala R 12/09/2016, 5:59 AM

## 2016-12-09 NOTE — Op Note (Signed)
Dictation Number [id number]

## 2016-12-09 NOTE — Brief Op Note (Signed)
12/09/2016  9:48 AM  PATIENT:  Barry Zavala  76 y.o. male  PRE-OPERATIVE DIAGNOSIS:  CARPAL TUNNEL LEFT CUBITAL TUNNEL LEFT  POST-OPERATIVE DIAGNOSIS:  CARPAL TUNNEL LEFT CUBITAL TUNNEL LEFT  PROCEDURE:  Procedure(s): DECOMPRESSION LEFT ULNAR NERVE (Left) CARPAL TUNNEL RELEASE (Left)  SURGEON:  Surgeon(s) and Role:    Daryll Brod, MD - Primary  PHYSICIAN ASSISTANT:   ASSISTANTS: none   ANESTHESIA:   local and general  EBL:  2 mL   BLOOD ADMINISTERED:none  DRAINS: none   LOCAL MEDICATIONS USED:  BUPIVICAINE   SPECIMEN:  No Specimen  DISPOSITION OF SPECIMEN:  N/A  COUNTS:  YES  TOURNIQUET:   Total Tourniquet Time Documented: Upper Arm (Left) - 36 minutes Total: Upper Arm (Left) - 36 minutes   DICTATION: .Other Dictation: Dictation Number [id number]  PLAN OF CARE: Discharge to home after PACU  PATIENT DISPOSITION:  PACU - hemodynamically stable.

## 2016-12-09 NOTE — Anesthesia Procedure Notes (Signed)
Procedure Name: LMA Insertion Date/Time: 12/09/2016 8:46 AM Performed by: Willa Frater, CRNA Pre-anesthesia Checklist: Patient identified, Emergency Drugs available, Suction available and Patient being monitored Patient Re-evaluated:Patient Re-evaluated prior to induction Oxygen Delivery Method: Circle system utilized Preoxygenation: Pre-oxygenation with 100% oxygen Induction Type: IV induction Ventilation: Mask ventilation without difficulty LMA: LMA inserted LMA Size: 4.0 Number of attempts: 1 Airway Equipment and Method: Bite block Placement Confirmation: positive ETCO2 Tube secured with: Tape Dental Injury: Teeth and Oropharynx as per pre-operative assessment

## 2016-12-09 NOTE — Op Note (Signed)
NAME:  Barry Zavala, Barry Zavala NO.:  [id number]  MEDICAL RECORD NO.:  [id number]  LOCATION:                                 FACILITY:  PHYSICIAN:  Daryll Brod, M.D.            DATE OF BIRTH:  DATE OF PROCEDURE:  12/09/2016 DATE OF DISCHARGE:                              OPERATIVE REPORT   PREOPERATIVE DIAGNOSIS:  Carpal tunnel syndrome, cubital tunnel syndrome, left hand, left arm.  POSTOPERATIVE DIAGNOSIS:  Carpal tunnel syndrome, cubital tunnel syndrome, left hand, left arm.  OPERATION:  Decompression of median nerve at the wrist, decompression of ulnar nerve at the elbow.  SURGEON:  Daryll Brod, MD.  ASSISTANT:  None.  ANESTHESIA:  General with local infiltration.  PLACE OF SURGERY:  [facility].  ANESTHESIOLOGISTOla Spurr.  HISTORY:  The patient is a 76 year old male with a history of numbness and tingling of his left arm.  Nerve conductions reveal carpal tunnel syndrome at the wrist and cubital tunnel syndrome at the elbow.  He has not responded to conservative treatment.  He has elected to undergo surgical decompression of the nerves at each position.  Pre, peri, and postoperative course have been discussed along with risks and complications.  He is aware that there is no guarantee to the surgery; the possibility of infection; recurrence of injury to arteries, nerves, tendons; incomplete relief of symptoms; and dystrophy.  In the preoperative area, the patient was seen, the extremity marked by both the patient and surgeon.  Antibiotic given.  DESCRIPTION OF PROCEDURE:  The patient was brought to the operating room, where general anesthetic was carried out without difficulty under the direction of the anesthesia department.  He was prepped using ChloraPrep in a supine position with a left arm free.  A 3-minute dry time was allowed, and a time-out taken, confirming the patient and procedure.  The limb was exsanguinated with an Esmarch  bandage. Tourniquet placed on the upper arm was inflated to 250 mmHg.  A longitudinal incision was made in the left palm.  This was carried down through subcutaneous tissue.  Bleeders were electrocauterized with bipolar.  The palmar fascia was split, superficial palmar arch was identified.  The flexor tendon to the ring and little finger were identified.  Retractors were placed protecting the median nerve radially and the ulnar nerve ulnarly.  The flexor retinaculum was then incised on its ulnar border.  A right angle and Sewell retractor were placed between skin and forearm fascia.  The fascia was then released for approximately 2 cm proximal to the wrist crease under direct vision. The canal was explored.  Area of compression to the nerve was apparent. The motor branch entered into muscle distally.  The wound was copiously irrigated with saline.  The skin was closed with interrupted 4-0 nylon sutures.  A separate incision was then made over the medial epicondyle of the left elbow.  This was carried down through subcutaneous tissue. Bleeders were again electrocauterized.  Osborne's fascia was incised on its posterior aspect and significant amount of scarring was present about the ulnar nerve.  The fascia was then dissected free from the overlying skin and  subcutaneous tissue along with flexor carpi ulnaris. A knee retractor was placed.  The superficial fascia of the flexor carpi ulnaris was then split with blunt dissection.  The muscle was then separated into dorsal and posterior aspects of the flexor carpi ulnaris. A KMI guide for carpal tunnel release was then placed between the nerve and the deep fascia, and a right angle ENT scissors was then used to transect the deep fascia for approximately 7-8 cm distally.  Attention was then directed proximally.  The subcutaneous tissue and skin was dissected free from the brachial fascia.  Bleeders were again electrocauterized with bipolar.  The  [facility] guide was then placed between the ulnar nerve proximally and the brachial fascia was then released proximally for approximately 7-8 cm proximally.  The elbow was fully flexed, no subluxation to the nerve was noted.  The wound was copiously irrigated with saline.  Osborne's fascia was then sutured to the posterior subcutaneous tissue in the posterior flap.  This was done with interrupted 2-0 Vicryl sutures.  The subcutaneous tissue was closed with interrupted 2-0 Vicryl and skin with interrupted 4-0 nylon sutures. This was done after irrigation.  A sterile compressive dressing to both wounds was applied.  On deflation of the tourniquet, all fingers immediately pinked.  He was taken to the recovery room for observation in satisfactory condition.  He will be discharged home to return to the [place] in 1 week, on Norco.          ______________________________ Daryll Brod, M.D.     GK/MEDQ  D:  12/09/2016  T:  12/09/2016  Job:  [id number]

## 2016-12-09 NOTE — Anesthesia Postprocedure Evaluation (Signed)
Anesthesia Post Note  Patient: Barry Zavala  Procedure(s) Performed: DECOMPRESSION LEFT ULNAR NERVE (Left Elbow) CARPAL TUNNEL RELEASE (Left Wrist)     Patient location during evaluation: PACU Anesthesia Type: General Level of consciousness: awake and alert Pain management: pain level controlled Vital Signs Assessment: post-procedure vital signs reviewed and stable Respiratory status: spontaneous breathing, nonlabored ventilation, respiratory function stable and patient connected to nasal cannula oxygen Cardiovascular status: blood pressure returned to baseline and stable Postop Assessment: no apparent nausea or vomiting Anesthetic complications: no    Last Vitals:  Vitals:   12/09/16 1015 12/09/16 1024  BP: 105/61 (!) 111/56  Pulse: 70   Resp: 11 18  Temp:  36.4 C  SpO2: 95% 97%    Last Pain:  Vitals:   12/09/16 1024  TempSrc: Axillary  PainSc: 0-No pain                 Tiajuana Amass

## 2016-12-10 ENCOUNTER — Encounter (HOSPITAL_BASED_OUTPATIENT_CLINIC_OR_DEPARTMENT_OTHER): Payer: Self-pay | Admitting: Orthopedic Surgery

## 2016-12-19 ENCOUNTER — Other Ambulatory Visit: Payer: Self-pay | Admitting: Orthopedic Surgery

## 2017-01-06 HISTORY — PX: EYE SURGERY: SHX253

## 2017-01-08 ENCOUNTER — Encounter (HOSPITAL_BASED_OUTPATIENT_CLINIC_OR_DEPARTMENT_OTHER): Payer: Self-pay | Admitting: *Deleted

## 2017-01-12 ENCOUNTER — Encounter (HOSPITAL_BASED_OUTPATIENT_CLINIC_OR_DEPARTMENT_OTHER)
Admission: RE | Admit: 2017-01-12 | Discharge: 2017-01-12 | Disposition: A | Discharge status: Home / Self Care | Payer: Medicare Other | Source: Ambulatory Visit | Attending: Orthopedic Surgery | Admitting: Orthopedic Surgery

## 2017-01-12 DIAGNOSIS — G5621 Lesion of ulnar nerve, right upper limb: Secondary | ICD-10-CM | POA: Diagnosis not present

## 2017-01-12 DIAGNOSIS — E669 Obesity, unspecified: Secondary | ICD-10-CM | POA: Diagnosis not present

## 2017-01-12 DIAGNOSIS — I2510 Atherosclerotic heart disease of native coronary artery without angina pectoris: Secondary | ICD-10-CM | POA: Diagnosis not present

## 2017-01-12 DIAGNOSIS — I10 Essential (primary) hypertension: Secondary | ICD-10-CM | POA: Diagnosis not present

## 2017-01-12 DIAGNOSIS — K589 Irritable bowel syndrome without diarrhea: Secondary | ICD-10-CM | POA: Diagnosis not present

## 2017-01-12 DIAGNOSIS — M47812 Spondylosis without myelopathy or radiculopathy, cervical region: Secondary | ICD-10-CM | POA: Diagnosis not present

## 2017-01-12 DIAGNOSIS — Z87891 Personal history of nicotine dependence: Secondary | ICD-10-CM | POA: Diagnosis not present

## 2017-01-12 DIAGNOSIS — G5601 Carpal tunnel syndrome, right upper limb: Secondary | ICD-10-CM | POA: Diagnosis present

## 2017-01-12 DIAGNOSIS — E785 Hyperlipidemia, unspecified: Secondary | ICD-10-CM | POA: Diagnosis not present

## 2017-01-12 DIAGNOSIS — G5603 Carpal tunnel syndrome, bilateral upper limbs: Secondary | ICD-10-CM | POA: Diagnosis not present

## 2017-01-12 DIAGNOSIS — E119 Type 2 diabetes mellitus without complications: Secondary | ICD-10-CM | POA: Diagnosis not present

## 2017-01-12 DIAGNOSIS — Z6826 Body mass index (BMI) 26.0-26.9, adult: Secondary | ICD-10-CM | POA: Diagnosis not present

## 2017-01-12 LAB — BASIC METABOLIC PANEL
ANION GAP: 8 (ref 5–15)
BUN: 25 mg/dL — ABNORMAL HIGH (ref 6–20)
CHLORIDE: 102 mmol/L (ref 101–111)
CO2: 27 mmol/L (ref 22–32)
Calcium: 9.6 mg/dL (ref 8.9–10.3)
Creatinine, Ser: 1.51 mg/dL — ABNORMAL HIGH (ref 0.61–1.24)
GFR calc non Af Amer: 43 mL/min — ABNORMAL LOW (ref >60)
GFR, EST AFRICAN AMERICAN: 50 mL/min — AB (ref >60)
Glucose, Bld: 220 mg/dL — ABNORMAL HIGH (ref 65–99)
POTASSIUM: 4.6 mmol/L (ref 3.5–5.1)
SODIUM: 137 mmol/L (ref 135–145)

## 2017-01-13 ENCOUNTER — Ambulatory Visit (HOSPITAL_BASED_OUTPATIENT_CLINIC_OR_DEPARTMENT_OTHER)
Admission: RE | Admit: 2017-01-13 | Discharge: 2017-01-13 | Disposition: A | Discharge status: Home / Self Care | Payer: Medicare Other | Source: Ambulatory Visit | Attending: Orthopedic Surgery | Admitting: Orthopedic Surgery

## 2017-01-13 ENCOUNTER — Encounter (HOSPITAL_BASED_OUTPATIENT_CLINIC_OR_DEPARTMENT_OTHER): Payer: Self-pay | Admitting: *Deleted

## 2017-01-13 ENCOUNTER — Encounter (HOSPITAL_BASED_OUTPATIENT_CLINIC_OR_DEPARTMENT_OTHER)
Admission: RE | Disposition: A | Discharge status: Home / Self Care | Payer: Self-pay | Source: Ambulatory Visit | Attending: Orthopedic Surgery

## 2017-01-13 ENCOUNTER — Ambulatory Visit (HOSPITAL_BASED_OUTPATIENT_CLINIC_OR_DEPARTMENT_OTHER): Payer: Medicare Other | Admitting: Anesthesiology

## 2017-01-13 ENCOUNTER — Other Ambulatory Visit: Payer: Self-pay

## 2017-01-13 DIAGNOSIS — G5621 Lesion of ulnar nerve, right upper limb: Secondary | ICD-10-CM | POA: Diagnosis not present

## 2017-01-13 DIAGNOSIS — E669 Obesity, unspecified: Secondary | ICD-10-CM | POA: Diagnosis not present

## 2017-01-13 DIAGNOSIS — E785 Hyperlipidemia, unspecified: Secondary | ICD-10-CM | POA: Diagnosis not present

## 2017-01-13 DIAGNOSIS — K589 Irritable bowel syndrome without diarrhea: Secondary | ICD-10-CM | POA: Diagnosis not present

## 2017-01-13 DIAGNOSIS — G5603 Carpal tunnel syndrome, bilateral upper limbs: Secondary | ICD-10-CM | POA: Insufficient documentation

## 2017-01-13 DIAGNOSIS — Z87891 Personal history of nicotine dependence: Secondary | ICD-10-CM | POA: Diagnosis not present

## 2017-01-13 DIAGNOSIS — I129 Hypertensive chronic kidney disease with stage 1 through stage 4 chronic kidney disease, or unspecified chronic kidney disease: Secondary | ICD-10-CM | POA: Diagnosis not present

## 2017-01-13 DIAGNOSIS — I2510 Atherosclerotic heart disease of native coronary artery without angina pectoris: Secondary | ICD-10-CM | POA: Insufficient documentation

## 2017-01-13 DIAGNOSIS — G5601 Carpal tunnel syndrome, right upper limb: Secondary | ICD-10-CM | POA: Diagnosis not present

## 2017-01-13 DIAGNOSIS — Z6826 Body mass index (BMI) 26.0-26.9, adult: Secondary | ICD-10-CM | POA: Diagnosis not present

## 2017-01-13 DIAGNOSIS — E119 Type 2 diabetes mellitus without complications: Secondary | ICD-10-CM | POA: Diagnosis not present

## 2017-01-13 DIAGNOSIS — N183 Chronic kidney disease, stage 3 (moderate): Secondary | ICD-10-CM | POA: Diagnosis not present

## 2017-01-13 DIAGNOSIS — M47812 Spondylosis without myelopathy or radiculopathy, cervical region: Secondary | ICD-10-CM | POA: Diagnosis not present

## 2017-01-13 DIAGNOSIS — I10 Essential (primary) hypertension: Secondary | ICD-10-CM | POA: Insufficient documentation

## 2017-01-13 HISTORY — PX: CARPAL TUNNEL WITH CUBITAL TUNNEL: SHX5608

## 2017-01-13 HISTORY — PX: ULNAR NERVE TRANSPOSITION: SHX2595

## 2017-01-13 LAB — GLUCOSE, CAPILLARY: Glucose-Capillary: 163 mg/dL — ABNORMAL HIGH (ref 65–99)

## 2017-01-13 SURGERY — ULNAR NERVE DECOMPRESSION/TRANSPOSITION
Anesthesia: General | Site: Wrist | Laterality: Right

## 2017-01-13 MED ORDER — LIDOCAINE HCL (CARDIAC) 20 MG/ML IV SOLN: INTRAVENOUS | Status: DC | PRN

## 2017-01-13 MED ORDER — DEXAMETHASONE SODIUM PHOSPHATE 4 MG/ML IJ SOLN
INTRAMUSCULAR | Status: DC | PRN
  Administered 2017-01-13: 5 mg via INTRAVENOUS

## 2017-01-13 MED ORDER — BUPIVACAINE HCL (PF) 0.25 % IJ SOLN
INTRAMUSCULAR | Status: AC
Start: 2017-01-13 — End: 2017-01-13
  Filled 2017-01-13: qty 30

## 2017-01-13 MED ORDER — LIDOCAINE HCL (PF) 1 % IJ SOLN
INTRAMUSCULAR | Status: AC
Start: 2017-01-13 — End: 2017-01-13
  Filled 2017-01-13: qty 5

## 2017-01-13 MED ORDER — ONDANSETRON HCL 4 MG/2ML IJ SOLN
INTRAMUSCULAR | Status: AC
  Filled 2017-01-13: qty 2

## 2017-01-13 MED ORDER — CHLORHEXIDINE GLUCONATE 4 % EX LIQD: 60.0000 mL | Freq: Once | CUTANEOUS | Status: DC

## 2017-01-13 MED ORDER — FENTANYL CITRATE (PF) 100 MCG/2ML IJ SOLN
INTRAMUSCULAR | Status: AC
  Filled 2017-01-13: qty 2

## 2017-01-13 MED ORDER — METOCLOPRAMIDE HCL 5 MG/ML IJ SOLN: 10.0000 mg | Freq: Once | INTRAMUSCULAR | Status: DC | PRN

## 2017-01-13 MED ORDER — MIDAZOLAM HCL 2 MG/2ML IJ SOLN
INTRAMUSCULAR | Status: AC
  Filled 2017-01-13: qty 2

## 2017-01-13 MED ORDER — PROPOFOL 10 MG/ML IV BOLUS
INTRAVENOUS | Status: DC | PRN
  Administered 2017-01-13: 150 mg via INTRAVENOUS

## 2017-01-13 MED ORDER — CEFAZOLIN SODIUM-DEXTROSE 2-4 GM/100ML-% IV SOLN
2.0000 g | INTRAVENOUS | Status: AC
  Administered 2017-01-13: 2 g via INTRAVENOUS

## 2017-01-13 MED ORDER — FENTANYL CITRATE (PF) 100 MCG/2ML IJ SOLN
50.0000 ug | INTRAMUSCULAR | Status: DC | PRN
  Administered 2017-01-13: 100 ug via INTRAVENOUS

## 2017-01-13 MED ORDER — LIDOCAINE 2% (20 MG/ML) 5 ML SYRINGE
INTRAMUSCULAR | Status: AC
  Filled 2017-01-13: qty 5

## 2017-01-13 MED ORDER — EPHEDRINE SULFATE-NACL 50-0.9 MG/10ML-% IV SOSY
PREFILLED_SYRINGE | INTRAVENOUS | Status: DC | PRN
  Administered 2017-01-13: 10 mg via INTRAVENOUS

## 2017-01-13 MED ORDER — MIDAZOLAM HCL 2 MG/2ML IJ SOLN
1.0000 mg | INTRAMUSCULAR | Status: DC | PRN
  Administered 2017-01-13: 2 mg via INTRAVENOUS

## 2017-01-13 MED ORDER — ONDANSETRON HCL 4 MG/2ML IJ SOLN
INTRAMUSCULAR | Status: DC | PRN
  Administered 2017-01-13: 4 mg via INTRAVENOUS

## 2017-01-13 MED ORDER — DEXAMETHASONE SODIUM PHOSPHATE 10 MG/ML IJ SOLN
INTRAMUSCULAR | Status: AC
  Filled 2017-01-13: qty 1

## 2017-01-13 MED ORDER — MEPERIDINE HCL 25 MG/ML IJ SOLN: 6.2500 mg | INTRAMUSCULAR | Status: DC | PRN

## 2017-01-13 MED ORDER — SCOPOLAMINE 1 MG/3DAYS TD PT72: 1.0000 | MEDICATED_PATCH | Freq: Once | TRANSDERMAL | Status: DC | PRN

## 2017-01-13 MED ORDER — LIDOCAINE 2% (20 MG/ML) 5 ML SYRINGE
INTRAMUSCULAR | Status: DC | PRN
  Administered 2017-01-13: 100 mg via INTRAVENOUS

## 2017-01-13 MED ORDER — FENTANYL CITRATE (PF) 100 MCG/2ML IJ SOLN: 25.0000 ug | INTRAMUSCULAR | Status: DC | PRN

## 2017-01-13 MED ORDER — PROPOFOL 500 MG/50ML IV EMUL
INTRAVENOUS | Status: AC
Start: 2017-01-13 — End: 2017-01-13
  Filled 2017-01-13: qty 50

## 2017-01-13 MED ORDER — BUPIVACAINE HCL (PF) 0.25 % IJ SOLN
INTRAMUSCULAR | Status: DC | PRN
  Administered 2017-01-13: 10 mL

## 2017-01-13 MED ORDER — LACTATED RINGERS IV SOLN
INTRAVENOUS | Status: DC
  Administered 2017-01-13 (×2): via INTRAVENOUS
  Administered 2017-01-13: 10 mL/h via INTRAVENOUS

## 2017-01-13 MED ORDER — CEFAZOLIN SODIUM-DEXTROSE 2-4 GM/100ML-% IV SOLN
INTRAVENOUS | Status: AC
  Filled 2017-01-13: qty 100

## 2017-01-13 MED ORDER — HYDROCODONE-ACETAMINOPHEN 5-325 MG PO TABS: 1.0000 | ORAL_TABLET | Freq: Four times a day (QID) | ORAL | 0 refills | Status: DC | PRN

## 2017-01-13 SURGICAL SUPPLY — 50 items
BLADE MINI RND TIP GREEN BEAV (BLADE) IMPLANT
BLADE SURG 15 STRL LF DISP TIS (BLADE) ×2 IMPLANT
BLADE SURG 15 STRL SS (BLADE) ×3
BNDG CMPR 9X4 STRL LF SNTH (GAUZE/BANDAGES/DRESSINGS) ×2
BNDG COHESIVE 3X5 TAN STRL LF (GAUZE/BANDAGES/DRESSINGS) ×6 IMPLANT
BNDG ESMARK 4X9 LF (GAUZE/BANDAGES/DRESSINGS) ×3 IMPLANT
BNDG GAUZE ELAST 4 BULKY (GAUZE/BANDAGES/DRESSINGS) ×3 IMPLANT
CHLORAPREP W/TINT 26ML (MISCELLANEOUS) ×3 IMPLANT
CORD BIPOLAR FORCEPS 12FT (ELECTRODE) ×3 IMPLANT
COVER BACK TABLE 60X90IN (DRAPES) ×3 IMPLANT
COVER MAYO STAND STRL (DRAPES) ×3 IMPLANT
CUFF TOURN SGL LL 18 NRW (TOURNIQUET CUFF) ×3 IMPLANT
DECANTER SPIKE VIAL GLASS SM (MISCELLANEOUS) IMPLANT
DRAPE EXTREMITY T 121X128X90 (DRAPE) ×3 IMPLANT
DRAPE SURG 17X23 STRL (DRAPES) ×3 IMPLANT
DRSG PAD ABDOMINAL 8X10 ST (GAUZE/BANDAGES/DRESSINGS) ×4 IMPLANT
GAUZE SPONGE 4X4 12PLY STRL (GAUZE/BANDAGES/DRESSINGS) ×3 IMPLANT
GAUZE SPONGE 4X4 16PLY XRAY LF (GAUZE/BANDAGES/DRESSINGS) IMPLANT
GAUZE XEROFORM 1X8 LF (GAUZE/BANDAGES/DRESSINGS) ×3 IMPLANT
GLOVE BIO SURGEON STRL SZ8 (GLOVE) ×1 IMPLANT
GLOVE BIOGEL PI IND STRL 8 (GLOVE) IMPLANT
GLOVE BIOGEL PI IND STRL 8.5 (GLOVE) ×2 IMPLANT
GLOVE BIOGEL PI INDICATOR 8 (GLOVE) ×1
GLOVE BIOGEL PI INDICATOR 8.5 (GLOVE) ×2
GLOVE SURG ORTHO 8.0 STRL STRW (GLOVE) ×3 IMPLANT
GOWN STRL REUS W/ TWL LRG LVL3 (GOWN DISPOSABLE) ×2 IMPLANT
GOWN STRL REUS W/ TWL XL LVL3 (GOWN DISPOSABLE) IMPLANT
GOWN STRL REUS W/TWL LRG LVL3 (GOWN DISPOSABLE) ×3
GOWN STRL REUS W/TWL XL LVL3 (GOWN DISPOSABLE) ×6 IMPLANT
LOOP VESSEL MAXI BLUE (MISCELLANEOUS) IMPLANT
NDL PRECISIONGLIDE 27X1.5 (NEEDLE) IMPLANT
NEEDLE PRECISIONGLIDE 27X1.5 (NEEDLE) ×3 IMPLANT
NS IRRIG 1000ML POUR BTL (IV SOLUTION) ×3 IMPLANT
PACK BASIN DAY SURGERY FS (CUSTOM PROCEDURE TRAY) ×3 IMPLANT
PAD CAST 3X4 CTTN HI CHSV (CAST SUPPLIES) IMPLANT
PAD CAST 4YDX4 CTTN HI CHSV (CAST SUPPLIES) ×2 IMPLANT
PADDING CAST COTTON 3X4 STRL (CAST SUPPLIES)
PADDING CAST COTTON 4X4 STRL (CAST SUPPLIES)
SLEEVE SCD COMPRESS KNEE MED (MISCELLANEOUS) ×3 IMPLANT
SPLINT PLASTER CAST XFAST 3X15 (CAST SUPPLIES) IMPLANT
SPLINT PLASTER XTRA FASTSET 3X (CAST SUPPLIES)
STOCKINETTE 4X48 STRL (DRAPES) ×3 IMPLANT
SUT ETHILON 4 0 PS 2 18 (SUTURE) ×3 IMPLANT
SUT VIC AB 2-0 SH 27 (SUTURE) ×3
SUT VIC AB 2-0 SH 27XBRD (SUTURE) ×2 IMPLANT
SUT VICRYL 4-0 PS2 18IN ABS (SUTURE) ×3 IMPLANT
SYR BULB 3OZ (MISCELLANEOUS) ×3 IMPLANT
SYR CONTROL 10ML LL (SYRINGE) ×1 IMPLANT
TOWEL OR 17X24 6PK STRL BLUE (TOWEL DISPOSABLE) ×3 IMPLANT
UNDERPAD 30X30 (UNDERPADS AND DIAPERS) ×3 IMPLANT

## 2017-01-13 NOTE — Brief Op Note (Signed)
01/13/2017  9:35 AM  PATIENT:  Barry Zavala  77 y.o. male  PRE-OPERATIVE DIAGNOSIS:  RIGHT CARPAL TUNNEL SYNDROME, RIGHT CUBITAL TUNNEL SYNDROME G56.001  POST-OPERATIVE DIAGNOSIS:  RIGHT CARPAL TUNNEL SYNDROME, RIGHT CUBITAL   PROCEDURE:  Procedure(s): DECOMPRESSION ULNAR NERVE (Right) RIGHT CARPAL TUNNEL WITH CUBITAL TUNNEL RELEASE (Right)  SURGEON:  Surgeon(s) and Role:    Daryll Brod, MD - Primary  PHYSICIAN ASSISTANT:   ASSISTANTS: none   ANESTHESIA:   local and general  EBL: 71ml  BLOOD ADMINISTERED:none  DRAINS: none   LOCAL MEDICATIONS USED:  BUPIVICAINE   SPECIMEN:  No Specimen  DISPOSITION OF SPECIMEN:  N/A  COUNTS:  YES  TOURNIQUET:   Total Tourniquet Time Documented: Upper Arm (Right) - 33 minutes Total: Upper Arm (Right) - 33 minutes   DICTATION: .Other Dictation: Dictation Number [id number]  PLAN OF CARE: discharge to home after PACU PATIENT DISPOSITION:  PACU - hemodynamically stable.

## 2017-01-13 NOTE — Transfer of Care (Signed)
Immediate Anesthesia Transfer of Care Note  Patient: Barry Zavala  Procedure(s) Performed: DECOMPRESSION ULNAR NERVE (Right Elbow) RIGHT CARPAL TUNNEL WITH CUBITAL TUNNEL RELEASE (Right Wrist)  Patient Location: PACU  Anesthesia Type:General  Level of Consciousness: awake, sedated and patient cooperative  Airway & Oxygen Therapy: Patient Spontanous Breathing  Post-op Assessment: Report given to RN and Post -op Vital signs reviewed and stable  Post vital signs: Reviewed and stable  Last Vitals:  Vitals:   01/13/17 1043 01/13/17 1047  BP: 103/67   Pulse: 65   Resp: 16   Temp:  36.4 C  SpO2: 96%     Last Pain:  Vitals:   01/13/17 1047  TempSrc: Oral  PainSc:       Patients Stated Pain Goal: 0 (11/06/26 1188)  Complications: No apparent anesthesia complications

## 2017-01-13 NOTE — Op Note (Signed)
NAME:  Barry Zavala, REHA NO.:  [id number]  MEDICAL RECORD NO.:  [id number]  LOCATION:                                 FACILITY:  PHYSICIAN:  Daryll Brod, M.D.            DATE OF BIRTH:  DATE OF PROCEDURE:  01/13/2017 DATE OF DISCHARGE:                              OPERATIVE REPORT   PREOPERATIVE DIAGNOSIS:  Carpal tunnel and cubital tunnel syndrome, right arm.  POSTOPERATIVE DIAGNOSIS:  Carpal tunnel and cubital tunnel syndrome, right arm.  OPERATION:  Decompression of median nerve at the wrist; decompression of ulnar nerve at the elbow.  SURGEON:  Daryll Brod, M.D.  ASSISTANT:  None.  ANESTHESIA:  General with local infiltration.  PLACE OF SURGERY:  [facility].  ANESTHESIOLOGIST:  Montez Hageman, MD.  HISTORY:  The patient is a 77 year old male with a history of carpal tunnel syndrome.  Nerve conduction is positive bilaterally.  He has undergone release on his left side.  He is admitted now for release to the right.  Pre, peri, and postoperative course have been discussed along with risks and complications.  He is aware that there is no guarantee to the surgery; the possibility of infection; recurrence of injury to arteries, nerves, tendons; incomplete relief of symptoms; and dystrophy.  In the preoperative area, the patient was seen, the extremity marked by both patient and surgeon, antibiotic given.  DESCRIPTION OF PROCEDURE:  The patient was brought to the operating room, where a general anesthetic was carried out without difficulty under the direction of the Anesthesia Department.  He was prepped using ChloraPrep in a supine position with the right arm free.  A 3-minute dry time was allowed and a time-out taken, confirming the patient and procedure.  The limb was exsanguinated with an Esmarch bandage. Tourniquet was placed high on the arm and was inflated to 250 mmHg.  A longitudinal incision was made in the right palm, carried down  through subcutaneous tissue.  Bleeders were electrocauterized with bipolar.  The palmar fascia was split.  The superficial palmar arch was identified. The flexor tendon to the ring and little finger was identified. Retractors were placed sweeping the median nerve radially, the ulnar nerve ulnarly.  The flexor retinaculum was then released on its ulnar border.  A right angle and Sewell retractor were placed between skin and forearm fascia.  Deep structures were dissected free from the fascia and the fascia released for approximately 2-1/2 to 3 cm proximal to the wrist crease under direct vision.  The canal was explored.  An area of compression to the nerve was apparent.  Motor branch entered into muscle distally.  The wound was copiously irrigated with saline.  The skin was closed with interrupted 4-0 nylon sutures.  A separate incision was then made over the medial side of his elbow just posterior to the epicondyle and carried down through subcutaneous tissue.  Neurovascular structures were protected.  The dissection was carried down to the ulnar nerve. This was identified and found to be markedly compressed.  Osborne's fascia had been then released.  The subcutaneous tissue and skin dissected free from the flexor carpi ulnaris.  The  superficial fascia of the flexor carpi ulnaris was then split.  The muscle belly was then bluntly dissected and split with a retractor.  A KMI guide for carpal tunnel release was then inserted between the deep fascia and the ulnar nerve and a right angle ENT scissors was then used to transect the deep fascia for approximately a centimeter distal to the epicondyle. Attention was then directed proximally.  The brachial fascia was dissected free from the overlying skin and subcutaneous tissue.  The knee retractor was placed proximally.  The [facility] guide was placed between the ulnar nerve proximally and the brachial fascia, and the fascia was released for a similar  distance proximally with the ENT angled scissors. The nerve did not subluxate.  The wound was copiously irrigated proximally and distally.  The area was then bathed with 0.25% bupivacaine without epinephrine.  The subcutaneous tissue was closed with an anterior to posterior flap of tissue to prevent the nerve from possibly subluxating palmarly.  This was done with figure-of-eight 2-0 Vicryl sutures.  The subcutaneous tissue was closed with interrupted 2-0 Vicryl and the skin with interrupted 4-0 nylon sutures.  Local infiltration with 0.25% bupivacaine without epinephrine was given to the 2 wounds, 10 mL was used.  A sterile compressive dressing was applied to each wound and the tourniquet deflated.  All fingers immediately pinked. He was taken to the recovery room for observation in satisfactory condition.  He will be discharged home to return to [place] in 1 week, on Norco.          ______________________________ Daryll Brod, M.D.     GK/MEDQ  D:  01/13/2017  T:  01/13/2017  Job:  [id number]

## 2017-01-13 NOTE — Op Note (Signed)
Dictation Number [id number]

## 2017-01-13 NOTE — Anesthesia Preprocedure Evaluation (Signed)
Anesthesia Evaluation  Patient identified by MRN, date of birth, ID band Patient awake    Reviewed: Allergy & Precautions, NPO status , Patient's Chart, lab work & pertinent test results  Airway Mallampati: II  TM Distance: >3 FB Neck ROM: Full    Dental  (+) Dental Advisory Given   Pulmonary former smoker,    breath sounds clear to auscultation       Cardiovascular hypertension, Pt. on medications and Pt. on home beta blockers + CAD and + Cardiac Stents  + dysrhythmias + Valvular Problems/Murmurs  Rhythm:Regular Rate:Normal  Low risk myoview 02/2015 RBBB   Neuro/Psych  Neuromuscular disease    GI/Hepatic Neg liver ROS, GERD  ,  Endo/Other  diabetes, Type 2  Renal/GU CRFRenal disease     Musculoskeletal   Abdominal   Peds  Hematology  (+) anemia ,   Anesthesia Other Findings   Reproductive/Obstetrics                             Lab Results  Component Value Date   WBC 8.9 05/01/2009   HGB 12.4 (L) 05/01/2009   HCT 35.2 (L) 05/01/2009   MCV 92.8 05/01/2009   PLT 249 05/01/2009   Lab Results  Component Value Date   CREATININE 1.51 (H) 01/12/2017   BUN 25 (H) 01/12/2017   NA 137 01/12/2017   K 4.6 01/12/2017   CL 102 01/12/2017   CO2 27 01/12/2017    Anesthesia Physical  Anesthesia Plan  ASA: III  Anesthesia Plan: General   Post-op Pain Management:    Induction: Intravenous  PONV Risk Score and Plan: 2 and Dexamethasone, Ondansetron and Treatment may vary due to age or medical condition  Airway Management Planned: LMA  Additional Equipment:   Intra-op Plan:   Post-operative Plan: Extubation in OR  Informed Consent: I have reviewed the patients History and Physical, chart, labs and discussed the procedure including the risks, benefits and alternatives for the proposed anesthesia with the patient or authorized representative who has indicated his/her understanding and  acceptance.   Dental advisory given  Plan Discussed with: CRNA  Anesthesia Plan Comments:         Anesthesia Quick Evaluation

## 2017-01-13 NOTE — Discharge Instructions (Addendum)

## 2017-01-13 NOTE — Anesthesia Postprocedure Evaluation (Signed)
Anesthesia Post Note  Patient: Judge Duque  Procedure(s) Performed: DECOMPRESSION ULNAR NERVE (Right Elbow) RIGHT CARPAL TUNNEL WITH CUBITAL TUNNEL RELEASE (Right Wrist)     Patient location during evaluation: PACU Anesthesia Type: General Level of consciousness: awake and alert Pain management: pain level controlled Vital Signs Assessment: post-procedure vital signs reviewed and stable Respiratory status: spontaneous breathing, nonlabored ventilation, respiratory function stable and patient connected to nasal cannula oxygen Cardiovascular status: blood pressure returned to baseline and stable Postop Assessment: no apparent nausea or vomiting Anesthetic complications: no    Last Vitals:  Vitals:   01/13/17 1043 01/13/17 1047  BP: 103/67   Pulse: 65   Resp: 16   Temp:  36.4 C  SpO2: 96%     Last Pain:  Vitals:   01/13/17 1047  TempSrc: Oral  PainSc:                  Montez Hageman

## 2017-01-13 NOTE — H&P (Signed)
Barry Zavala is an 77 y.o. male.   Chief Complaint: numbness right hand PRF:FMBWGY is a 77 year old right-hand-dominant male with a complaint of numbness and tingling in both hands. He was referred to Dr. Thereasa Parkin for nerve conductions that he has been seen by Dr. Lorin Mercy for his neck and Dr. Estanislado Pandy. 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 Earle Gell at [facility] in the neurology department. 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      Past Medical History:  Diagnosis Date  . Anemia   . Coronary artery disease   . Elevated PSA   . Erectile dysfunction   . Hyperlipidemia   . Hypertension   . Irritable bowel syndrome   . Obesity   . Onychomycosis   . Palmar fibromatosis   . Right bundle branch block   . Type 2 diabetes mellitus ([place])     Past Surgical History:  Procedure Laterality Date  . APPENDECTOMY    . cardiac stents    . CARPAL TUNNEL RELEASE Left 12/09/2016   Procedure: CARPAL TUNNEL RELEASE;  Surgeon: Daryll Brod, MD;  Location: [place];  Service: Orthopedics;  Laterality: Left;  . ULNAR NERVE TRANSPOSITION Left 12/09/2016   Procedure: DECOMPRESSION LEFT ULNAR NERVE;  Surgeon: Daryll Brod, MD;  Location: [place];  Service: Orthopedics;  Laterality: Left;    Family History  Problem Relation Age of Onset  . Ovarian cancer Mother 66  . Heart attack Father   . Cancer Paternal Uncle        [place]posi Sarcoma  . Multiple myeloma Sister 58   Social History:  reports that he quit smoking about 31 years ago. His smoking use included cigarettes. He has a 20.00 pack-year smoking history. he has never used smokeless tobacco. He reports  that he does not drink alcohol or use drugs.  Allergies:  Allergies  Allergen Reactions  . Other Other (See Comments)    Agent: Vit C,e,[id number]-[id number] Reaction: GI upset    No medications prior to admission.    Results for orders placed or performed during the hospital encounter of 01/13/17 (from the past 48 hour(s))  Basic metabolic panel     Status: Abnormal   Collection Time: 01/12/17 11:30 AM  Result Value Ref Range   Sodium 137 135 - 145 mmol/L   Potassium 4.6 3.5 - 5.1 mmol/L   Chloride 102 101 - 111 mmol/L   CO2 27 22 - 32 mmol/L   Glucose, Bld 220 (H) 65 - 99 mg/dL   BUN 25 (H) 6 - 20 mg/dL   Creatinine, Ser 1.51 (H) 0.61 - 1.24 mg/dL   Calcium 9.6 8.9 - 10.3 mg/dL   GFR calc non Af Amer 43 (L) >60 mL/min   GFR calc Af Amer 50 (L) >60 mL/min    Comment: (NOTE) The eGFR has been calculated using the CKD EPI equation. This calculation has not been validated in all clinical situations. eGFR's persistently <60 mL/min signify possible Chronic Kidney Disease.    Anion gap 8 5 - 15    No results found.   Pertinent items are noted in HPI.  Height 5' 11.5" (1.816 m), weight 86.2 kg (190 lb).  General appearance: alert, cooperative  and appears stated age Head: Normocephalic, without obvious abnormality Neck: no JVD Resp: clear to auscultation bilaterally Cardio: regular rate and rhythm, S1, S2 normal, no murmur, click, rub or gallop GI: soft, non-tender; bowel sounds normal; no masses,  no organomegaly Extremities: numbness right hand Pulses: 2+ and symmetric Skin: Skin color, texture, turgor normal. No rashes or lesions Neurologic: Grossly normal Incision/Wound: na  Assessment/Plan Assessment:  1. Cubital tunnel syndrome, bilateral  2. Bilateral carpal tunnel syndrome  3. Cervical spondylosis without myelopathy    Plan: Recommend proceeding with carpal tunnel cubital tunnel releases bilaterally. Pre-peri-and postoperative course are discussed along  with risk complications. He is aware that there is no guarantee to the surgery the possibility of infection recurrence injury to arteries nerves tendons incomplete relief symptoms and dystrophy. They are aware that we are going to attempt to halt the process hopefully allow it to get better. It is especially relevant to the ulnar nerve. Advised there is no guarantee to the surgery the possibility of infection recurrence injury to arteries nerves tendons complete relief symptoms dystrophy. To proceed. The would like to proceed to the right side. He is scheduled for carpal tunnel cubital tunnel decompression possible transposition ulnar nerve at the elbow. His questions are encouraged and answered to their satisfaction. Scheduled for carpal tunnel cubital tunnel release and outpatient under regional anesthesia right arm.      KUZMA,GARY R 01/13/2017, 3:54 AM

## 2017-01-15 ENCOUNTER — Encounter (HOSPITAL_BASED_OUTPATIENT_CLINIC_OR_DEPARTMENT_OTHER): Payer: Self-pay | Admitting: Orthopedic Surgery

## 2017-01-20 DIAGNOSIS — E7849 Other hyperlipidemia: Secondary | ICD-10-CM | POA: Diagnosis not present

## 2017-01-20 DIAGNOSIS — I2510 Atherosclerotic heart disease of native coronary artery without angina pectoris: Secondary | ICD-10-CM | POA: Diagnosis not present

## 2017-01-20 DIAGNOSIS — Z1389 Encounter for screening for other disorder: Secondary | ICD-10-CM | POA: Diagnosis not present

## 2017-01-20 DIAGNOSIS — G5600 Carpal tunnel syndrome, unspecified upper limb: Secondary | ICD-10-CM | POA: Diagnosis not present

## 2017-01-20 DIAGNOSIS — E1129 Type 2 diabetes mellitus with other diabetic kidney complication: Secondary | ICD-10-CM | POA: Diagnosis not present

## 2017-01-20 DIAGNOSIS — D6489 Other specified anemias: Secondary | ICD-10-CM | POA: Diagnosis not present

## 2017-01-20 DIAGNOSIS — Z6827 Body mass index (BMI) 27.0-27.9, adult: Secondary | ICD-10-CM | POA: Diagnosis not present

## 2017-01-20 DIAGNOSIS — E1121 Type 2 diabetes mellitus with diabetic nephropathy: Secondary | ICD-10-CM | POA: Diagnosis not present

## 2017-01-20 DIAGNOSIS — N08 Glomerular disorders in diseases classified elsewhere: Secondary | ICD-10-CM | POA: Diagnosis not present

## 2017-01-20 DIAGNOSIS — I779 Disorder of arteries and arterioles, unspecified: Secondary | ICD-10-CM | POA: Diagnosis not present

## 2017-01-20 DIAGNOSIS — I10 Essential (primary) hypertension: Secondary | ICD-10-CM | POA: Diagnosis not present

## 2017-02-01 NOTE — Progress Notes (Signed)
Cardiology Office Note    Date:  02/02/2017   ID:  Barry Zavala, DOB 02-09-1940, MRN [id number]  PCP:  Josetta Huddle, MD  Cardiologist: Sinclair Grooms, MD   Chief Complaint  Patient presents with  . Coronary Artery Disease    History of Present Illness:  Barry Zavala is a 77 y.o. male with a history of remote MI in 1995, and PCI in 2001. Myoview in Feb 2017 was low risk. He last saw Dr Tamala Julian in jan 2018. Other medical problems include NIDDM, HTN, HLD, CRI, and RBBB.   Hemoglobin A1c is higher than previous.  Dr. Forde Dandy is aggressively managing.  He denies chest pain.  He has not had syncope.  No orthopnea, PND, edema, palpitations, or transient neurological symptoms.   Past Medical History:  Diagnosis Date  . Anemia   . Coronary artery disease   . Elevated PSA   . Erectile dysfunction   . Hyperlipidemia   . Hypertension   . Irritable bowel syndrome   . Obesity   . Onychomycosis   . Palmar fibromatosis   . Right bundle branch block   . Type 2 diabetes mellitus ([place])     Past Surgical History:  Procedure Laterality Date  . APPENDECTOMY    . cardiac stents    . CARPAL TUNNEL RELEASE Left 12/09/2016   Procedure: CARPAL TUNNEL RELEASE;  Surgeon: Daryll Brod, MD;  Location: [place];  Service: Orthopedics;  Laterality: Left;  . CARPAL TUNNEL WITH CUBITAL TUNNEL Right 01/13/2017   Procedure: RIGHT CARPAL TUNNEL WITH CUBITAL TUNNEL RELEASE;  Surgeon: Daryll Brod, MD;  Location: [place];  Service: Orthopedics;  Laterality: Right;  . ULNAR NERVE TRANSPOSITION Left 12/09/2016   Procedure: DECOMPRESSION LEFT ULNAR NERVE;  Surgeon: Daryll Brod, MD;  Location: [place];  Service: Orthopedics;  Laterality: Left;  . ULNAR NERVE TRANSPOSITION Right 01/13/2017   Procedure: DECOMPRESSION ULNAR NERVE;  Surgeon: Daryll Brod, MD;  Location: [place];  Service: Orthopedics;  Laterality: Right;    Current  Medications: Outpatient Medications Prior to Visit  Medication Sig Dispense Refill  . ACTOS 45 MG tablet Take 45 mg by mouth daily.  1  . aspirin EC 81 MG tablet Take 1 tablet (81 mg total) by mouth daily. 90 tablet 3  . Empagliflozin (JARDIANCE) 10 MG TABS Take by mouth.    . Ergocalciferol (VITAMIN D2) 2000 units TABS Take 2,000 Units by mouth daily.    Marland Kitchen ezetimibe-simvastatin (VYTORIN) 10-20 MG tablet Take 1 tablet by mouth daily. 90 tablet 3  . glyBURIDE-metformin (GLUCOVANCE) 5-500 MG per tablet Take 2 tablets by mouth 2 (two) times daily with a meal.    . metoprolol succinate (TOPROL-XL) 50 MG 24 hr tablet Take 50 mg by mouth daily. Take with or immediately following a meal.    . Multiple Vitamins-Minerals (CENTRUM SILVER PO) Take 1 tablet by mouth daily.    Marland Kitchen omega-3 acid ethyl esters (LOVAZA) 1 G capsule Take 1 g by mouth 3 (three) times daily.    Marland Kitchen omeprazole (PRILOSEC) 20 MG capsule Take 20 mg by mouth 2 (two) times a week.     . ONGLYZA 5 MG TABS tablet Take 5 mg by mouth daily.   2  . ramipril (ALTACE) 10 MG capsule Take 10 mg by mouth daily.    . cholecalciferol (VITAMIN D) 1000 UNITS tablet Take 2,000 Units by mouth daily.    Marland Kitchen HYDROcodone-acetaminophen (NORCO) 5-325 MG  tablet Take 1 tablet by mouth every 6 (six) hours as needed. (Patient not taking: Reported on 02/02/2017) 20 tablet 0   No facility-administered medications prior to visit.      Allergies:   Other   Social History   Socioeconomic History  . Marital status: Married    Spouse name: None  . Number of children: 2  . Years of education: None  . Highest education level: None  Social Needs  . Financial resource strain: None  . Food insecurity - worry: None  . Food insecurity - inability: None  . Transportation needs - medical: None  . Transportation needs - non-medical: None  Occupational History  . None  Tobacco Use  . Smoking status: Former Smoker    Packs/day: 1.00    Years: 20.00    Pack years:  20.00    Types: Cigarettes    Last attempt to quit: 1988    Years since quitting: 31.0  . Smokeless tobacco: Never Used  Substance and Sexual Activity  . Alcohol use: No    Alcohol/week: 0.0 oz  . Drug use: No  . Sexual activity: None  Other Topics Concern  . None  Social History Narrative  . None     Family History:  The patient's family history includes Cancer in his paternal uncle; Heart attack in his father; Multiple myeloma (age of onset: 16) in his sister; Ovarian cancer (age of onset: 107) in his mother.   ROS:   Please see the history of present illness.    Cataract surgery by Dr. Gershon Crane.  Recent Carpal Tunl. and Ulnar Tunl. release procedures performed recently by Dr. Hardie Pulley.  Has some erythema and swelling in the right elbow. All other systems reviewed and are negative.   PHYSICAL EXAM:   VS:  BP 110/60   Pulse 76   Ht '5\' 11"'$  (1.803 m)   Wt 198 lb 12.8 oz (90.2 kg)   BMI 27.73 kg/m    GEN: Well nourished, well developed, in no acute distress  HEENT: normal  Neck: no JVD, carotid bruits, or masses Cardiac: RRR; with 2/6 to 3/6 crescendo decrescendo right upper sternal border and left lower sternal border systolic murmur consistent with aortic stenosis.  No rubs, or gallops,no edema. Respiratory:  clear to auscultation bilaterally, normal work of breathing GI: soft, nontender, nondistended, + BS MS: no deformity or atrophy  Skin: warm and dry, no rash Neuro:  Alert and Oriented x 3, Strength and sensation are intact Psych: euthymic mood, full affect  Wt Readings from Last 3 Encounters:  02/02/17 198 lb 12.8 oz (90.2 kg)  01/13/17 191 lb (86.6 kg)  12/09/16 190 lb 9.6 oz (86.5 kg)      Studies/Labs Reviewed:   EKG:  EKG sinus rhythm with right bundle branch block.  No change when compared to prior.  When compared to January 2018 no change has occurred.  Recent Labs: 01/12/2017: BUN 25; Creatinine, Ser 1.51; Potassium 4.6; Sodium 137   Lipid Panel      Component Value Date/Time   CHOL 83 (L) 01/24/2015 0949   TRIG 49 01/24/2015 0949   HDL 38 (L) 01/24/2015 0949   CHOLHDL 2.2 01/24/2015 0949   VLDL 10 01/24/2015 0949   LDLCALC 35 01/24/2015 0949    Additional studies/ records that were reviewed today include:  2005 abdominal ultrasound did not reveal evidence of aortic aneurysm.  Carotid Doppler April 2018 bilateral less than 50% obstruction.    ASSESSMENT:  1. Essential hypertension   2. Right bundle branch block   3. Aortic valve stenosis, etiology of cardiac valve disease unspecified   4. Non-insulin treated type 2 diabetes mellitus (HCC)   5. Stage 3 chronic kidney disease ([place])   6. Mixed hyperlipidemia   7. Left carotid bruit      PLAN:  In order of problems listed above:  1. Excellent blood pressure control. 2. No change when compared to prior 3. By auscultation of the systolic murmur is now aortic stenosis.  2D Doppler echocardiogram needs to be done to stage severity. 4. A1c is greater than 7.  Aggressive management strategy by Dr. Forde Dandy is applauded. 5. Creatinine is 1.5.  This is also being managed by primary. 6. Last LDL less than 80. 7. Recent Doppler of the carotids reveals less than 50% obstruction bilaterally.  Overall doing well.  Continue risk factor modification.  Clinical follow-up in 1 year.  2D Doppler echocardiogram to assess severity of aortic valve obstruction.      Medication Adjustments/Labs and Tests Ordered: Current medicines are reviewed at length with the patient today.  Concerns regarding medicines are outlined above.  Medication changes, Labs and Tests ordered today are listed in the Patient Instructions below. Patient Instructions  Medication Instructions:  Your physician recommends that you continue on your current medications as directed. Please refer to the Current Medication list given to you today.  Labwork: None  Testing/Procedures: Your physician has requested that  you have an echocardiogram anytime prior to your follow up with Dr. Tamala Julian in January 2020. Echocardiography is a painless test that uses sound waves to create images of your heart. It provides your doctor with information about the size and shape of your heart and how well your heart's chambers and valves are working. This procedure takes approximately one hour. There are no restrictions for this procedure.    Follow-Up: Your physician wants you to follow-up in: 1 year with Dr. Tamala Julian.  You will receive a reminder letter in the mail two months in advance. If you don't receive a letter, please call our office to schedule the follow-up appointment.   Any Other Special Instructions Will Be Listed Below (If Applicable).     If you need a refill on your cardiac medications before your next appointment, please call your pharmacy.      Signed, Sinclair Grooms, MD  02/02/2017 9:00 AM    [facility] [address], [place], [place]  [address] Phone: [phone]; Fax: [phone]

## 2017-02-02 ENCOUNTER — Ambulatory Visit (INDEPENDENT_AMBULATORY_CARE_PROVIDER_SITE_OTHER): Payer: Medicare Other | Admitting: Interventional Cardiology

## 2017-02-02 ENCOUNTER — Encounter: Payer: Self-pay | Admitting: Interventional Cardiology

## 2017-02-02 VITALS — BP 110/60 | HR 76 | Ht 71.0 in | Wt 198.8 lb

## 2017-02-02 DIAGNOSIS — R0989 Other specified symptoms and signs involving the circulatory and respiratory systems: Secondary | ICD-10-CM | POA: Diagnosis not present

## 2017-02-02 DIAGNOSIS — N183 Chronic kidney disease, stage 3 unspecified: Secondary | ICD-10-CM

## 2017-02-02 DIAGNOSIS — I350 Nonrheumatic aortic (valve) stenosis: Secondary | ICD-10-CM

## 2017-02-02 DIAGNOSIS — E119 Type 2 diabetes mellitus without complications: Secondary | ICD-10-CM | POA: Diagnosis not present

## 2017-02-02 DIAGNOSIS — E782 Mixed hyperlipidemia: Secondary | ICD-10-CM

## 2017-02-02 DIAGNOSIS — I10 Essential (primary) hypertension: Secondary | ICD-10-CM

## 2017-02-02 DIAGNOSIS — I4510 Unspecified right bundle-branch block: Secondary | ICD-10-CM

## 2017-02-02 NOTE — Patient Instructions (Signed)
Medication Instructions:  Your physician recommends that you continue on your current medications as directed. Please refer to the Current Medication list given to you today.  Labwork: None  Testing/Procedures: Your physician has requested that you have an echocardiogram anytime prior to your follow up with Dr. Tamala Julian in January 2020. Echocardiography is a painless test that uses sound waves to create images of your heart. It provides your doctor with information about the size and shape of your heart and how well your heart's chambers and valves are working. This procedure takes approximately one hour. There are no restrictions for this procedure.    Follow-Up: Your physician wants you to follow-up in: 1 year with Dr. Tamala Julian.  You will receive a reminder letter in the mail two months in advance. If you don't receive a letter, please call our office to schedule the follow-up appointment.   Any Other Special Instructions Will Be Listed Below (If Applicable).     If you need a refill on your cardiac medications before your next appointment, please call your pharmacy.

## 2017-02-05 ENCOUNTER — Ambulatory Visit (HOSPITAL_COMMUNITY): Payer: Medicare Other | Attending: Cardiology

## 2017-02-05 ENCOUNTER — Other Ambulatory Visit: Payer: Self-pay

## 2017-02-05 DIAGNOSIS — I350 Nonrheumatic aortic (valve) stenosis: Secondary | ICD-10-CM | POA: Insufficient documentation

## 2017-02-05 DIAGNOSIS — E119 Type 2 diabetes mellitus without complications: Secondary | ICD-10-CM | POA: Insufficient documentation

## 2017-02-05 DIAGNOSIS — I4510 Unspecified right bundle-branch block: Secondary | ICD-10-CM | POA: Insufficient documentation

## 2017-02-05 DIAGNOSIS — I2510 Atherosclerotic heart disease of native coronary artery without angina pectoris: Secondary | ICD-10-CM | POA: Insufficient documentation

## 2017-02-05 DIAGNOSIS — E785 Hyperlipidemia, unspecified: Secondary | ICD-10-CM | POA: Insufficient documentation

## 2017-02-05 DIAGNOSIS — I119 Hypertensive heart disease without heart failure: Secondary | ICD-10-CM | POA: Insufficient documentation

## 2017-02-26 ENCOUNTER — Other Ambulatory Visit: Payer: Self-pay | Admitting: Interventional Cardiology

## 2017-03-25 DIAGNOSIS — D229 Melanocytic nevi, unspecified: Secondary | ICD-10-CM | POA: Diagnosis not present

## 2017-03-25 DIAGNOSIS — L821 Other seborrheic keratosis: Secondary | ICD-10-CM | POA: Diagnosis not present

## 2017-03-30 DIAGNOSIS — E785 Hyperlipidemia, unspecified: Secondary | ICD-10-CM | POA: Diagnosis not present

## 2017-03-30 DIAGNOSIS — N183 Chronic kidney disease, stage 3 (moderate): Secondary | ICD-10-CM | POA: Diagnosis not present

## 2017-03-30 DIAGNOSIS — I10 Essential (primary) hypertension: Secondary | ICD-10-CM | POA: Diagnosis not present

## 2017-03-30 DIAGNOSIS — I350 Nonrheumatic aortic (valve) stenosis: Secondary | ICD-10-CM | POA: Diagnosis not present

## 2017-03-30 DIAGNOSIS — Z1389 Encounter for screening for other disorder: Secondary | ICD-10-CM | POA: Diagnosis not present

## 2017-03-30 DIAGNOSIS — E1121 Type 2 diabetes mellitus with diabetic nephropathy: Secondary | ICD-10-CM | POA: Diagnosis not present

## 2017-03-30 DIAGNOSIS — Z6827 Body mass index (BMI) 27.0-27.9, adult: Secondary | ICD-10-CM | POA: Diagnosis not present

## 2017-03-30 DIAGNOSIS — I2510 Atherosclerotic heart disease of native coronary artery without angina pectoris: Secondary | ICD-10-CM | POA: Diagnosis not present

## 2017-03-30 DIAGNOSIS — I779 Disorder of arteries and arterioles, unspecified: Secondary | ICD-10-CM | POA: Diagnosis not present

## 2017-04-01 DIAGNOSIS — M19041 Primary osteoarthritis, right hand: Secondary | ICD-10-CM | POA: Diagnosis not present

## 2017-04-02 DIAGNOSIS — M545 Low back pain: Secondary | ICD-10-CM | POA: Diagnosis not present

## 2017-04-14 ENCOUNTER — Ambulatory Visit
Admission: RE | Admit: 2017-04-14 | Discharge: 2017-04-14 | Disposition: A | Discharge status: Home / Self Care | Payer: Medicare Other | Source: Ambulatory Visit | Attending: Internal Medicine | Admitting: Internal Medicine

## 2017-04-14 ENCOUNTER — Other Ambulatory Visit: Payer: Self-pay | Admitting: Internal Medicine

## 2017-04-14 DIAGNOSIS — M545 Low back pain: Secondary | ICD-10-CM | POA: Diagnosis not present

## 2017-04-15 DIAGNOSIS — G5603 Carpal tunnel syndrome, bilateral upper limbs: Secondary | ICD-10-CM | POA: Diagnosis not present

## 2017-04-15 DIAGNOSIS — M1812 Unilateral primary osteoarthritis of first carpometacarpal joint, left hand: Secondary | ICD-10-CM | POA: Diagnosis not present

## 2017-04-15 DIAGNOSIS — G5623 Lesion of ulnar nerve, bilateral upper limbs: Secondary | ICD-10-CM | POA: Diagnosis not present

## 2017-04-20 DIAGNOSIS — M545 Low back pain: Secondary | ICD-10-CM | POA: Diagnosis not present

## 2017-04-26 DIAGNOSIS — J209 Acute bronchitis, unspecified: Secondary | ICD-10-CM | POA: Diagnosis not present

## 2017-04-30 DIAGNOSIS — J9801 Acute bronchospasm: Secondary | ICD-10-CM | POA: Diagnosis not present

## 2017-05-18 DIAGNOSIS — I10 Essential (primary) hypertension: Secondary | ICD-10-CM | POA: Diagnosis not present

## 2017-05-18 DIAGNOSIS — Z1389 Encounter for screening for other disorder: Secondary | ICD-10-CM | POA: Diagnosis not present

## 2017-05-18 DIAGNOSIS — I2510 Atherosclerotic heart disease of native coronary artery without angina pectoris: Secondary | ICD-10-CM | POA: Diagnosis not present

## 2017-05-18 DIAGNOSIS — E1129 Type 2 diabetes mellitus with other diabetic kidney complication: Secondary | ICD-10-CM | POA: Diagnosis not present

## 2017-05-18 DIAGNOSIS — N183 Chronic kidney disease, stage 3 (moderate): Secondary | ICD-10-CM | POA: Diagnosis not present

## 2017-05-18 DIAGNOSIS — I350 Nonrheumatic aortic (valve) stenosis: Secondary | ICD-10-CM | POA: Diagnosis not present

## 2017-05-18 DIAGNOSIS — Z6826 Body mass index (BMI) 26.0-26.9, adult: Secondary | ICD-10-CM | POA: Diagnosis not present

## 2017-05-18 DIAGNOSIS — E7849 Other hyperlipidemia: Secondary | ICD-10-CM | POA: Diagnosis not present

## 2017-05-18 DIAGNOSIS — I779 Disorder of arteries and arterioles, unspecified: Secondary | ICD-10-CM | POA: Diagnosis not present

## 2017-06-05 ENCOUNTER — Telehealth: Payer: Self-pay

## 2017-06-05 NOTE — Telephone Encounter (Signed)
**Note De-Identified Arsal Tappan Obfuscation** I have done a Vytorin PA through covermymeds.

## 2017-06-08 DIAGNOSIS — I2510 Atherosclerotic heart disease of native coronary artery without angina pectoris: Secondary | ICD-10-CM | POA: Diagnosis not present

## 2017-06-08 DIAGNOSIS — I10 Essential (primary) hypertension: Secondary | ICD-10-CM | POA: Diagnosis not present

## 2017-06-08 DIAGNOSIS — Z0001 Encounter for general adult medical examination with abnormal findings: Secondary | ICD-10-CM | POA: Diagnosis not present

## 2017-06-08 DIAGNOSIS — Z0000 Encounter for general adult medical examination without abnormal findings: Secondary | ICD-10-CM | POA: Diagnosis not present

## 2017-06-08 DIAGNOSIS — N183 Chronic kidney disease, stage 3 (moderate): Secondary | ICD-10-CM | POA: Diagnosis not present

## 2017-06-08 DIAGNOSIS — Z1389 Encounter for screening for other disorder: Secondary | ICD-10-CM | POA: Diagnosis not present

## 2017-06-08 DIAGNOSIS — M545 Low back pain: Secondary | ICD-10-CM | POA: Diagnosis not present

## 2017-06-08 DIAGNOSIS — E782 Mixed hyperlipidemia: Secondary | ICD-10-CM | POA: Diagnosis not present

## 2017-06-08 DIAGNOSIS — J9801 Acute bronchospasm: Secondary | ICD-10-CM | POA: Diagnosis not present

## 2017-06-08 DIAGNOSIS — E119 Type 2 diabetes mellitus without complications: Secondary | ICD-10-CM | POA: Diagnosis not present

## 2017-06-09 NOTE — Telephone Encounter (Signed)
Letter received via fax from [facility] stating that the pts Vytorin PA has been approved. Approval good until 06/06/18.  I have notified the pts pharmacy.

## 2017-06-10 DIAGNOSIS — R972 Elevated prostate specific antigen [PSA]: Secondary | ICD-10-CM | POA: Diagnosis not present

## 2017-06-16 DIAGNOSIS — N5201 Erectile dysfunction due to arterial insufficiency: Secondary | ICD-10-CM | POA: Diagnosis not present

## 2017-06-16 DIAGNOSIS — R972 Elevated prostate specific antigen [PSA]: Secondary | ICD-10-CM | POA: Diagnosis not present

## 2017-08-27 DIAGNOSIS — E119 Type 2 diabetes mellitus without complications: Secondary | ICD-10-CM | POA: Diagnosis not present

## 2017-08-27 DIAGNOSIS — Z961 Presence of intraocular lens: Secondary | ICD-10-CM | POA: Diagnosis not present

## 2017-08-27 DIAGNOSIS — Z7984 Long term (current) use of oral hypoglycemic drugs: Secondary | ICD-10-CM | POA: Diagnosis not present

## 2017-09-21 DIAGNOSIS — N183 Chronic kidney disease, stage 3 (moderate): Secondary | ICD-10-CM | POA: Diagnosis not present

## 2017-09-21 DIAGNOSIS — E7849 Other hyperlipidemia: Secondary | ICD-10-CM | POA: Diagnosis not present

## 2017-09-21 DIAGNOSIS — Z6826 Body mass index (BMI) 26.0-26.9, adult: Secondary | ICD-10-CM | POA: Diagnosis not present

## 2017-09-21 DIAGNOSIS — I350 Nonrheumatic aortic (valve) stenosis: Secondary | ICD-10-CM | POA: Diagnosis not present

## 2017-09-21 DIAGNOSIS — M545 Low back pain: Secondary | ICD-10-CM | POA: Diagnosis not present

## 2017-09-21 DIAGNOSIS — D6489 Other specified anemias: Secondary | ICD-10-CM | POA: Diagnosis not present

## 2017-09-21 DIAGNOSIS — E1129 Type 2 diabetes mellitus with other diabetic kidney complication: Secondary | ICD-10-CM | POA: Diagnosis not present

## 2017-09-21 DIAGNOSIS — I779 Disorder of arteries and arterioles, unspecified: Secondary | ICD-10-CM | POA: Diagnosis not present

## 2017-09-21 DIAGNOSIS — I10 Essential (primary) hypertension: Secondary | ICD-10-CM | POA: Diagnosis not present

## 2017-09-21 DIAGNOSIS — I2510 Atherosclerotic heart disease of native coronary artery without angina pectoris: Secondary | ICD-10-CM | POA: Diagnosis not present

## 2017-10-01 DIAGNOSIS — Z23 Encounter for immunization: Secondary | ICD-10-CM | POA: Diagnosis not present

## 2017-12-10 DIAGNOSIS — E782 Mixed hyperlipidemia: Secondary | ICD-10-CM | POA: Diagnosis not present

## 2017-12-10 DIAGNOSIS — M65319 Trigger thumb, unspecified thumb: Secondary | ICD-10-CM | POA: Diagnosis not present

## 2017-12-10 DIAGNOSIS — I2510 Atherosclerotic heart disease of native coronary artery without angina pectoris: Secondary | ICD-10-CM | POA: Diagnosis not present

## 2017-12-10 DIAGNOSIS — N183 Chronic kidney disease, stage 3 (moderate): Secondary | ICD-10-CM | POA: Diagnosis not present

## 2017-12-10 DIAGNOSIS — E119 Type 2 diabetes mellitus without complications: Secondary | ICD-10-CM | POA: Diagnosis not present

## 2017-12-10 DIAGNOSIS — I10 Essential (primary) hypertension: Secondary | ICD-10-CM | POA: Diagnosis not present

## 2017-12-23 DIAGNOSIS — M65312 Trigger thumb, left thumb: Secondary | ICD-10-CM | POA: Diagnosis not present

## 2017-12-28 ENCOUNTER — Other Ambulatory Visit: Payer: Self-pay | Admitting: *Deleted

## 2017-12-28 DIAGNOSIS — I350 Nonrheumatic aortic (valve) stenosis: Secondary | ICD-10-CM

## 2018-01-04 ENCOUNTER — Other Ambulatory Visit: Payer: Self-pay

## 2018-01-04 ENCOUNTER — Ambulatory Visit (HOSPITAL_COMMUNITY): Payer: Medicare Other | Attending: Cardiology

## 2018-01-04 DIAGNOSIS — I2510 Atherosclerotic heart disease of native coronary artery without angina pectoris: Secondary | ICD-10-CM | POA: Diagnosis not present

## 2018-01-04 DIAGNOSIS — E782 Mixed hyperlipidemia: Secondary | ICD-10-CM | POA: Diagnosis not present

## 2018-01-04 DIAGNOSIS — I10 Essential (primary) hypertension: Secondary | ICD-10-CM | POA: Diagnosis not present

## 2018-01-04 DIAGNOSIS — I350 Nonrheumatic aortic (valve) stenosis: Secondary | ICD-10-CM | POA: Diagnosis not present

## 2018-01-04 DIAGNOSIS — N183 Chronic kidney disease, stage 3 (moderate): Secondary | ICD-10-CM | POA: Diagnosis not present

## 2018-01-04 DIAGNOSIS — E119 Type 2 diabetes mellitus without complications: Secondary | ICD-10-CM | POA: Diagnosis not present

## 2018-01-07 ENCOUNTER — Encounter: Payer: Self-pay | Admitting: Interventional Cardiology

## 2018-01-20 DIAGNOSIS — E1129 Type 2 diabetes mellitus with other diabetic kidney complication: Secondary | ICD-10-CM | POA: Diagnosis not present

## 2018-01-20 DIAGNOSIS — I10 Essential (primary) hypertension: Secondary | ICD-10-CM | POA: Diagnosis not present

## 2018-01-20 DIAGNOSIS — E1121 Type 2 diabetes mellitus with diabetic nephropathy: Secondary | ICD-10-CM | POA: Diagnosis not present

## 2018-01-20 DIAGNOSIS — I779 Disorder of arteries and arterioles, unspecified: Secondary | ICD-10-CM | POA: Diagnosis not present

## 2018-01-20 DIAGNOSIS — I2510 Atherosclerotic heart disease of native coronary artery without angina pectoris: Secondary | ICD-10-CM | POA: Diagnosis not present

## 2018-01-20 DIAGNOSIS — N183 Chronic kidney disease, stage 3 (moderate): Secondary | ICD-10-CM | POA: Diagnosis not present

## 2018-01-20 DIAGNOSIS — E7849 Other hyperlipidemia: Secondary | ICD-10-CM | POA: Diagnosis not present

## 2018-01-22 DIAGNOSIS — M65312 Trigger thumb, left thumb: Secondary | ICD-10-CM | POA: Diagnosis not present

## 2018-02-02 NOTE — Progress Notes (Signed)
Cardiology Office Note:    Date:  02/03/2018   ID:  Barry Zavala, DOB 1940/06/29, MRN [id number]  PCP:  Josetta Huddle, MD  Cardiologist:  Sinclair Grooms, MD   Referring MD: Josetta Huddle, MD   Chief Complaint  Patient presents with  . Coronary Artery Disease    History of Present Illness:    Barry Zavala is a 78 y.o. male with a hx of remote MI in 1995, and PCI in 2001. Myoview in Feb 2017 was low risk. He last saw Dr Tamala Julian in jan 2018. Other medical problems include NIDDM, HTN, HLD, CRI, aortic stenosis, and RBBB.    Barry Zavala has had an uneventful year.  No hospital admissions.  No prolonged episodes of chest discomfort.  He is compliant with his medication regimen and has not had functional testing.  He did have echocardiography at the end of December to follow-up on aortic stenosis identified at the last visit.  Peak aortic valve velocity on the most recent study is 359 ms.  With reference to coronary disease, walking up an incline can be associated with mild burning that is relieved with rest.  This was first noticed in November.  He is only had 2 episodes.  He has had no episodes of rest.  With reference to aortic stenosis, he has not had syncope, orthopnea, PND, excessive exertional dyspnea, but did have the exertional burning as noted above.  Medication regimen is stable.  Secondary risk prevention is adequate with triglyceride 98 LDL 64   Past Medical History:  Diagnosis Date  . Anemia   . Coronary artery disease   . Elevated PSA   . Erectile dysfunction   . Hyperlipidemia   . Hypertension   . Irritable bowel syndrome   . Obesity   . Onychomycosis   . Palmar fibromatosis   . Right bundle branch block   . Type 2 diabetes mellitus ([place])     Past Surgical History:  Procedure Laterality Date  . APPENDECTOMY    . cardiac stents    . CARPAL TUNNEL RELEASE Left 12/09/2016   Procedure: CARPAL TUNNEL RELEASE;  Surgeon: Daryll Brod, MD;  Location: [place];  Service: Orthopedics;  Laterality: Left;  . CARPAL TUNNEL WITH CUBITAL TUNNEL Right 01/13/2017   Procedure: RIGHT CARPAL TUNNEL WITH CUBITAL TUNNEL RELEASE;  Surgeon: Daryll Brod, MD;  Location: [place];  Service: Orthopedics;  Laterality: Right;  . ULNAR NERVE TRANSPOSITION Left 12/09/2016   Procedure: DECOMPRESSION LEFT ULNAR NERVE;  Surgeon: Daryll Brod, MD;  Location: [place];  Service: Orthopedics;  Laterality: Left;  . ULNAR NERVE TRANSPOSITION Right 01/13/2017   Procedure: DECOMPRESSION ULNAR NERVE;  Surgeon: Daryll Brod, MD;  Location: [place];  Service: Orthopedics;  Laterality: Right;    Current Medications: Current Meds  Medication Sig  . ACTOS 45 MG tablet Take 45 mg by mouth daily.  Marland Kitchen aspirin EC 81 MG tablet Take 1 tablet (81 mg total) by mouth daily.  . Empagliflozin (JARDIANCE) 10 MG TABS Take by mouth.  . Ergocalciferol (VITAMIN D2) 2000 units TABS Take 2,000 Units by mouth daily.  Marland Kitchen glyBURIDE-metformin (GLUCOVANCE) 5-500 MG per tablet Take 2 tablets by mouth 2 (two) times daily with a meal.  . metoprolol succinate (TOPROL-XL) 50 MG 24 hr tablet Take 50 mg by mouth daily. Take with or immediately following a meal.  . Multiple Vitamins-Minerals (CENTRUM SILVER PO) Take 1 tablet by mouth daily.  Marland Kitchen omega-3  acid ethyl esters (LOVAZA) 1 G capsule Take 1 g by mouth 3 (three) times daily.  Marland Kitchen omeprazole (PRILOSEC) 20 MG capsule Take 20 mg by mouth 2 (two) times a week.   . ONGLYZA 5 MG TABS tablet Take 5 mg by mouth daily.   . ramipril (ALTACE) 10 MG capsule Take 10 mg by mouth daily.  Marland Kitchen VYTORIN 10-20 MG tablet TAKE 1 TABLET BY MOUTH DAILY.     Allergies:   Other   Social History   Socioeconomic History  . Marital status: Married    Spouse name: Not on file  . Number of children: 2  . Years of education: Not on file  . Highest education level: Not on file  Occupational History  . Not on file  Social Needs    . Financial resource strain: Not on file  . Food insecurity:    Worry: Not on file    Inability: Not on file  . Transportation needs:    Medical: Not on file    Non-medical: Not on file  Tobacco Use  . Smoking status: Former Smoker    Packs/day: 1.00    Years: 20.00    Pack years: 20.00    Types: Cigarettes    Last attempt to quit: 1988    Years since quitting: 32.0  . Smokeless tobacco: Never Used  Substance and Sexual Activity  . Alcohol use: No    Alcohol/week: 0.0 standard drinks  . Drug use: No  . Sexual activity: Not on file  Lifestyle  . Physical activity:    Days per week: Not on file    Minutes per session: Not on file  . Stress: Not on file  Relationships  . Social connections:    Talks on phone: Not on file    Gets together: Not on file    Attends religious service: Not on file    Active member of club or organization: Not on file    Attends meetings of clubs or organizations: Not on file    Relationship status: Not on file  Other Topics Concern  . Not on file  Social History Narrative  . Not on file     Family History: The patient's family history includes Cancer in his paternal uncle; Heart attack in his father; Multiple myeloma (age of onset: 33) in his sister; Ovarian cancer (age of onset: 49) in his mother.  ROS:   Please see the history of present illness.    Bilateral knee discomfort.  Esophageal reflux if he lies down too soon after eating.  All other systems reviewed and are negative.  EKGs/Labs/Other Studies Reviewed:    The following studies were reviewed today:  2D Doppler echocardiogram 01/04/2018: Study Conclusions  - Left ventricle: The cavity size was normal. Wall thickness was   increased in a pattern of mild LVH. Systolic function was normal.   The estimated ejection fraction was in the range of 55% to 60%.   Wall motion was normal; there were no regional wall motion   abnormalities. Doppler parameters are consistent with  abnormal   left ventricular relaxation (grade 1 diastolic dysfunction). - Aortic valve: Valve mobility was restricted. There was moderate   stenosis.  Impressions:  - Normal LV systolic function; mild diastolic dysfunction; mild   LVH; calcified aortic valve with moderate AS (mean gradient 30   mmHg).  EKG:  EKG performed 02/03/2018, demonstrating normal sinus rhythm, right bundle branch block, and no change when compared to 11/21/2016.  Recent Labs: No results found for requested labs within last 8760 hours.  Recent Lipid Panel    Component Value Date/Time   CHOL 83 (L) 01/24/2015 0949   TRIG 49 01/24/2015 0949   HDL 38 (L) 01/24/2015 0949   CHOLHDL 2.2 01/24/2015 0949   VLDL 10 01/24/2015 0949   LDLCALC 35 01/24/2015 0949    Physical Exam:    VS:  Ht '5\' 11"'$  (1.803 m)   BMI 27.73 kg/m     Wt Readings from Last 3 Encounters:  02/02/17 198 lb 12.8 oz (90.2 kg)  01/13/17 191 lb (86.6 kg)  12/09/16 190 lb 9.6 oz (86.5 kg)     GEN: Healthy and younger than stated age in appearance. No acute distress HEENT: Normal NECK: No JVD. LYMPHATICS: No lymphadenopathy CARDIAC: RRR.  3/6 crescendo decrescendo aortic stenosis right upper sternal border murmur, no gallop, no edema VASCULAR: Diminished carotid upstroke.  2+ and symmetric radial pulses, transmitted bilateral carotid bruits RESPIRATORY:  Clear to auscultation without rales, wheezing or rhonchi  ABDOMEN: Soft, non-tender, non-distended, No pulsatile mass, MUSCULOSKELETAL: No deformity  SKIN: Warm and dry NEUROLOGIC:  Alert and oriented x 3 PSYCHIATRIC:  Normal affect   ASSESSMENT:    1. Aortic valve stenosis, etiology of cardiac valve disease unspecified   2. CAD S/P percutaneous coronary angioplasty   3. Non-insulin treated type 2 diabetes mellitus ([place])   4. Essential hypertension   5. Right bundle branch block   6. Stage 3 chronic kidney disease ([place])   7. Left carotid bruit    PLAN:    In order of  problems listed above:  1. Moderately severe aortic stenosis with a transvalvular velocity of 360 mm/s.  Discussion of natural history of aortic stenosis, anticipated treatment (percutaneous valve replacement) and the need for yearly surveillance.  Cardinal symptoms were reviewed. 2. Will perform a stress Myoview as he has had 1 or 2 episodes of exertional chest burning when walking up hills.  Aggressive secondary risk prevention is reviewed. 3. Hemoglobin A1c target should be less than 7. 4. Target blood pressure 130/80 mmHg is being achieved. 5. EKG is unchanged. 6. Not assessed 7. Bilateral carotid transmitted bruits from the aortic valve.  Recommendation is to follow-up echo in 1 year prior to the next office visit.  He needs a stress Myoview done within the month to evaluate exertional chest burning.  At length conversation concerning secondary prevention.  Overall education and awareness concerning primary/secondary risk prevention was discussed in detail: LDL less than 70, hemoglobin A1c less than 7, blood pressure target less than 130/80 mmHg, >150 minutes of moderate aerobic activity per week, avoidance of smoking, weight control (via diet and exercise), and continued surveillance/management of/for obstructive sleep apnea.  Plan 1 year follow-up   Medication Adjustments/Labs and Tests Ordered: Current medicines are reviewed at length with the patient today.  Concerns regarding medicines are outlined above.  No orders of the defined types were placed in this encounter.  No orders of the defined types were placed in this encounter.   There are no Patient Instructions on file for this visit.   Signed, Sinclair Grooms, MD  02/03/2018 8:16 AM    [facility]

## 2018-02-03 ENCOUNTER — Ambulatory Visit (INDEPENDENT_AMBULATORY_CARE_PROVIDER_SITE_OTHER): Payer: Medicare Other | Admitting: Interventional Cardiology

## 2018-02-03 ENCOUNTER — Encounter: Payer: Self-pay | Admitting: Interventional Cardiology

## 2018-02-03 ENCOUNTER — Encounter: Payer: Self-pay | Admitting: *Deleted

## 2018-02-03 VITALS — BP 118/62 | HR 63 | Ht 71.0 in | Wt 192.1 lb

## 2018-02-03 DIAGNOSIS — I10 Essential (primary) hypertension: Secondary | ICD-10-CM

## 2018-02-03 DIAGNOSIS — N183 Chronic kidney disease, stage 3 unspecified: Secondary | ICD-10-CM

## 2018-02-03 DIAGNOSIS — E119 Type 2 diabetes mellitus without complications: Secondary | ICD-10-CM

## 2018-02-03 DIAGNOSIS — I4510 Unspecified right bundle-branch block: Secondary | ICD-10-CM | POA: Diagnosis not present

## 2018-02-03 DIAGNOSIS — R0989 Other specified symptoms and signs involving the circulatory and respiratory systems: Secondary | ICD-10-CM | POA: Diagnosis not present

## 2018-02-03 DIAGNOSIS — Z9861 Coronary angioplasty status: Secondary | ICD-10-CM | POA: Diagnosis not present

## 2018-02-03 DIAGNOSIS — I350 Nonrheumatic aortic (valve) stenosis: Secondary | ICD-10-CM

## 2018-02-03 DIAGNOSIS — I2510 Atherosclerotic heart disease of native coronary artery without angina pectoris: Secondary | ICD-10-CM

## 2018-02-03 NOTE — Patient Instructions (Signed)
Medication Instructions:  No change If you need a refill on your cardiac medications before your next appointment, please call your pharmacy.   Lab work: none If you have labs (blood work) drawn today and your tests are completely normal, you will receive your results only by: Marland Kitchen MyChart Message (if you have MyChart) OR . A paper copy in the mail If you have any lab test that is abnormal or we need to change your treatment, we will call you to review the results.  Testing/Procedures: Your physician has requested that you have an echocardiogram. Echocardiography is a painless test that uses sound waves to create images of your heart. It provides your doctor with information about the size and shape of your heart and how well your heart's chambers and valves are working. This procedure takes approximately one hour. There are no restrictions for this procedure.  THIS SHOULD BE SCHEDULED IN ONE YEAR, PRIOR TO NEXT APPOINTMENT WITH DR Tamala Julian.  Your physician has requested that you have en exercise stress myoview. For further information please visit [email or web address]. Please follow instruction sheet, as given.   Follow-Up: At [facility], you and your health needs are our priority.  As part of our continuing mission to provide you with exceptional heart care, we have created designated Provider Care Teams.  These Care Teams include your primary Cardiologist (physician) and Advanced Practice Providers (APPs -  Physician Assistants and Nurse Practitioners) who all work together to provide you with the care you need, when you need it. You will need a follow up appointment in 12 months.  Please call our office 2 months in advance to schedule this appointment.  You may see Sinclair Grooms, MD or one of the following Advanced Practice Providers on your designated Care Team:   Truitt Merle, NP Cecilie Kicks, NP . Kathyrn Drown, NP  Any Other Special Instructions Will Be Listed Below (If  Applicable).

## 2018-02-08 ENCOUNTER — Telehealth (HOSPITAL_COMMUNITY): Payer: Self-pay | Admitting: *Deleted

## 2018-02-08 NOTE — Telephone Encounter (Signed)
Patient given detailed instructions per Myocardial Perfusion Study Information Sheet for the test on 02/10/18. Patient notified to arrive 15 minutes early and that it is imperative to arrive on time for appointment to keep from having the test rescheduled.  If you need to cancel or reschedule your appointment, please call the office within 24 hours of your appointment. . Patient verbalized understanding. Kirstie Peri

## 2018-02-10 ENCOUNTER — Ambulatory Visit (HOSPITAL_COMMUNITY): Payer: Medicare Other | Attending: Cardiology

## 2018-02-10 DIAGNOSIS — Z9861 Coronary angioplasty status: Secondary | ICD-10-CM

## 2018-02-10 DIAGNOSIS — R0989 Other specified symptoms and signs involving the circulatory and respiratory systems: Secondary | ICD-10-CM | POA: Insufficient documentation

## 2018-02-10 DIAGNOSIS — I2510 Atherosclerotic heart disease of native coronary artery without angina pectoris: Secondary | ICD-10-CM | POA: Diagnosis not present

## 2018-02-10 DIAGNOSIS — E119 Type 2 diabetes mellitus without complications: Secondary | ICD-10-CM | POA: Diagnosis not present

## 2018-02-10 DIAGNOSIS — I350 Nonrheumatic aortic (valve) stenosis: Secondary | ICD-10-CM | POA: Diagnosis not present

## 2018-02-10 DIAGNOSIS — N183 Chronic kidney disease, stage 3 unspecified: Secondary | ICD-10-CM

## 2018-02-10 DIAGNOSIS — I10 Essential (primary) hypertension: Secondary | ICD-10-CM

## 2018-02-10 DIAGNOSIS — I4510 Unspecified right bundle-branch block: Secondary | ICD-10-CM | POA: Insufficient documentation

## 2018-02-10 LAB — MYOCARDIAL PERFUSION IMAGING
Estimated workload: 7 METS
Exercise duration (min): 6 min
Exercise duration (sec): 0 s
LV dias vol: 70 mL (ref 62–150)
LVSYSVOL: 26 mL
MPHR: 142 {beats}/min
Peak HR: 126 {beats}/min
Percent HR: 88 %
Rest HR: 76 {beats}/min
SDS: 0
SRS: 0
SSS: 0
TID: 1.18

## 2018-02-10 MED ORDER — TECHNETIUM TC 99M TETROFOSMIN IV KIT
32.5000 | PACK | Freq: Once | INTRAVENOUS | Status: AC | PRN
  Administered 2018-02-10: 32.5 via INTRAVENOUS
  Filled 2018-02-10: qty 33

## 2018-02-10 MED ORDER — TECHNETIUM TC 99M TETROFOSMIN IV KIT
10.5000 | PACK | Freq: Once | INTRAVENOUS | Status: AC | PRN
  Administered 2018-02-10: 10.5 via INTRAVENOUS
  Filled 2018-02-10: qty 11

## 2018-02-17 ENCOUNTER — Other Ambulatory Visit: Payer: Self-pay | Admitting: Interventional Cardiology

## 2018-02-26 DIAGNOSIS — M65352 Trigger finger, left little finger: Secondary | ICD-10-CM | POA: Diagnosis not present

## 2018-02-26 DIAGNOSIS — M65312 Trigger thumb, left thumb: Secondary | ICD-10-CM | POA: Diagnosis not present

## 2018-04-21 DIAGNOSIS — E782 Mixed hyperlipidemia: Secondary | ICD-10-CM | POA: Diagnosis not present

## 2018-04-21 DIAGNOSIS — E119 Type 2 diabetes mellitus without complications: Secondary | ICD-10-CM | POA: Diagnosis not present

## 2018-04-21 DIAGNOSIS — I2510 Atherosclerotic heart disease of native coronary artery without angina pectoris: Secondary | ICD-10-CM | POA: Diagnosis not present

## 2018-04-21 DIAGNOSIS — N183 Chronic kidney disease, stage 3 (moderate): Secondary | ICD-10-CM | POA: Diagnosis not present

## 2018-04-21 DIAGNOSIS — I10 Essential (primary) hypertension: Secondary | ICD-10-CM | POA: Diagnosis not present

## 2018-04-21 DIAGNOSIS — E7800 Pure hypercholesterolemia, unspecified: Secondary | ICD-10-CM | POA: Diagnosis not present

## 2018-05-14 DIAGNOSIS — I779 Disorder of arteries and arterioles, unspecified: Secondary | ICD-10-CM | POA: Diagnosis not present

## 2018-05-14 DIAGNOSIS — E1121 Type 2 diabetes mellitus with diabetic nephropathy: Secondary | ICD-10-CM | POA: Diagnosis not present

## 2018-05-14 DIAGNOSIS — E785 Hyperlipidemia, unspecified: Secondary | ICD-10-CM | POA: Diagnosis not present

## 2018-05-14 DIAGNOSIS — N183 Chronic kidney disease, stage 3 (moderate): Secondary | ICD-10-CM | POA: Diagnosis not present

## 2018-05-14 DIAGNOSIS — I5189 Other ill-defined heart diseases: Secondary | ICD-10-CM | POA: Diagnosis not present

## 2018-05-14 DIAGNOSIS — I350 Nonrheumatic aortic (valve) stenosis: Secondary | ICD-10-CM | POA: Diagnosis not present

## 2018-05-14 DIAGNOSIS — Z1331 Encounter for screening for depression: Secondary | ICD-10-CM | POA: Diagnosis not present

## 2018-05-14 DIAGNOSIS — I2510 Atherosclerotic heart disease of native coronary artery without angina pectoris: Secondary | ICD-10-CM | POA: Diagnosis not present

## 2018-06-14 DIAGNOSIS — Z1211 Encounter for screening for malignant neoplasm of colon: Secondary | ICD-10-CM | POA: Diagnosis not present

## 2018-06-14 DIAGNOSIS — Z1389 Encounter for screening for other disorder: Secondary | ICD-10-CM | POA: Diagnosis not present

## 2018-06-14 DIAGNOSIS — Z0001 Encounter for general adult medical examination with abnormal findings: Secondary | ICD-10-CM | POA: Diagnosis not present

## 2018-06-15 DIAGNOSIS — Z125 Encounter for screening for malignant neoplasm of prostate: Secondary | ICD-10-CM | POA: Diagnosis not present

## 2018-06-21 DIAGNOSIS — D229 Melanocytic nevi, unspecified: Secondary | ICD-10-CM | POA: Diagnosis not present

## 2018-06-21 DIAGNOSIS — L821 Other seborrheic keratosis: Secondary | ICD-10-CM | POA: Diagnosis not present

## 2018-06-22 DIAGNOSIS — R972 Elevated prostate specific antigen [PSA]: Secondary | ICD-10-CM | POA: Diagnosis not present

## 2018-06-22 DIAGNOSIS — N5201 Erectile dysfunction due to arterial insufficiency: Secondary | ICD-10-CM | POA: Diagnosis not present

## 2018-06-22 DIAGNOSIS — Z125 Encounter for screening for malignant neoplasm of prostate: Secondary | ICD-10-CM | POA: Diagnosis not present

## 2018-06-22 DIAGNOSIS — N401 Enlarged prostate with lower urinary tract symptoms: Secondary | ICD-10-CM | POA: Diagnosis not present

## 2018-06-22 DIAGNOSIS — R351 Nocturia: Secondary | ICD-10-CM | POA: Diagnosis not present

## 2018-06-30 DIAGNOSIS — M65312 Trigger thumb, left thumb: Secondary | ICD-10-CM | POA: Diagnosis not present

## 2018-06-30 DIAGNOSIS — M65352 Trigger finger, left little finger: Secondary | ICD-10-CM | POA: Diagnosis not present

## 2018-06-30 DIAGNOSIS — N183 Chronic kidney disease, stage 3 (moderate): Secondary | ICD-10-CM | POA: Diagnosis not present

## 2018-06-30 DIAGNOSIS — E119 Type 2 diabetes mellitus without complications: Secondary | ICD-10-CM | POA: Diagnosis not present

## 2018-06-30 DIAGNOSIS — I2510 Atherosclerotic heart disease of native coronary artery without angina pectoris: Secondary | ICD-10-CM | POA: Diagnosis not present

## 2018-06-30 DIAGNOSIS — E782 Mixed hyperlipidemia: Secondary | ICD-10-CM | POA: Diagnosis not present

## 2018-06-30 DIAGNOSIS — E7800 Pure hypercholesterolemia, unspecified: Secondary | ICD-10-CM | POA: Diagnosis not present

## 2018-06-30 DIAGNOSIS — I10 Essential (primary) hypertension: Secondary | ICD-10-CM | POA: Diagnosis not present

## 2018-07-01 ENCOUNTER — Other Ambulatory Visit: Payer: Self-pay | Admitting: Orthopedic Surgery

## 2018-07-21 DIAGNOSIS — Z1211 Encounter for screening for malignant neoplasm of colon: Secondary | ICD-10-CM | POA: Diagnosis not present

## 2018-07-21 DIAGNOSIS — Z1212 Encounter for screening for malignant neoplasm of rectum: Secondary | ICD-10-CM | POA: Diagnosis not present

## 2018-08-04 ENCOUNTER — Other Ambulatory Visit: Payer: Self-pay

## 2018-08-04 ENCOUNTER — Encounter (HOSPITAL_BASED_OUTPATIENT_CLINIC_OR_DEPARTMENT_OTHER): Payer: Self-pay | Admitting: *Deleted

## 2018-08-04 NOTE — Progress Notes (Signed)
Chart reviewed with Dr [place] Bing hx moderately severe AS, CAD-MI with stents, OK for [facility].

## 2018-08-06 ENCOUNTER — Encounter (HOSPITAL_BASED_OUTPATIENT_CLINIC_OR_DEPARTMENT_OTHER)
Admission: RE | Admit: 2018-08-06 | Discharge: 2018-08-06 | Disposition: A | Discharge status: Home / Self Care | Payer: Medicare Other | Source: Ambulatory Visit | Attending: Orthopedic Surgery | Admitting: Orthopedic Surgery

## 2018-08-06 ENCOUNTER — Other Ambulatory Visit (HOSPITAL_COMMUNITY)
Admission: RE | Admit: 2018-08-06 | Discharge: 2018-08-06 | Disposition: A | Discharge status: Home / Self Care | Payer: Medicare Other | Source: Ambulatory Visit | Attending: Orthopedic Surgery | Admitting: Orthopedic Surgery

## 2018-08-06 ENCOUNTER — Other Ambulatory Visit: Payer: Self-pay

## 2018-08-06 DIAGNOSIS — Z20828 Contact with and (suspected) exposure to other viral communicable diseases: Secondary | ICD-10-CM | POA: Diagnosis not present

## 2018-08-06 DIAGNOSIS — Z01812 Encounter for preprocedural laboratory examination: Secondary | ICD-10-CM | POA: Diagnosis not present

## 2018-08-06 LAB — BASIC METABOLIC PANEL
Anion gap: 8 (ref 5–15)
BUN: 29 mg/dL — ABNORMAL HIGH (ref 8–23)
CO2: 24 mmol/L (ref 22–32)
Calcium: 9.2 mg/dL (ref 8.9–10.3)
Chloride: 107 mmol/L (ref 98–111)
Creatinine, Ser: 1.84 mg/dL — ABNORMAL HIGH (ref 0.61–1.24)
GFR calc Af Amer: 40 mL/min — ABNORMAL LOW (ref >60)
GFR calc non Af Amer: 34 mL/min — ABNORMAL LOW (ref >60)
Glucose, Bld: 164 mg/dL — ABNORMAL HIGH (ref 70–99)
Potassium: 4.9 mmol/L (ref 3.5–5.1)
Sodium: 139 mmol/L (ref 135–145)

## 2018-08-06 LAB — SARS CORONAVIRUS 2 (TAT 6-24 HRS): SARS Coronavirus 2: NEGATIVE

## 2018-08-06 NOTE — Progress Notes (Addendum)
Gatorade drink given with instructions to complete by Narcissa, pt verbalized understanding.  Lab results reviewed by Dr. Conrad [place], will proceed with surgery as scheduled.

## 2018-08-09 NOTE — Anesthesia Preprocedure Evaluation (Addendum)
Anesthesia Evaluation  Patient identified by MRN, date of birth, ID band Patient awake    Reviewed: Allergy & Precautions, H&P , NPO status , Patient's Chart, lab work & pertinent test results  Airway Mallampati: I  TM Distance: >3 FB Neck ROM: Full    Dental  (+) Dental Advisory Given   Pulmonary former smoker,    Pulmonary exam normal breath sounds clear to auscultation       Cardiovascular hypertension, Pt. on medications and Pt. on home beta blockers + CAD and + Cardiac Stents  Normal cardiovascular exam+ Valvular Problems/Murmurs AS  Rhythm:Regular Rate:Normal  Low risk myoview 02/2015 RBBB  Echo 19' Study Conclusions  - Left ventricle: The cavity size was normal. Wall thickness was   increased in a pattern of mild LVH. Systolic function was normal.   The estimated ejection fraction was in the range of 55% to 60%.   Wall motion was normal; there were no regional wall motion   abnormalities. Doppler parameters are consistent with abnormal   left ventricular relaxation (grade 1 diastolic dysfunction). - Aortic valve: Valve mobility was restricted. There was moderate   stenosis.   Neuro/Psych  Neuromuscular disease    GI/Hepatic Neg liver ROS, GERD  Medicated and Controlled,  Endo/Other  diabetes, Type 2, Oral Hypoglycemic Agents  Renal/GU CRFRenal disease     Musculoskeletal   Abdominal   Peds  Hematology  (+) Blood dyscrasia, anemia ,   Anesthesia Other Findings   Reproductive/Obstetrics                            Lab Results  Component Value Date   WBC 8.9 05/01/2009   HGB 12.4 (L) 05/01/2009   HCT 35.2 (L) 05/01/2009   MCV 92.8 05/01/2009   PLT 249 05/01/2009   Lab Results  Component Value Date   CREATININE 1.84 (H) 08/06/2018   BUN 29 (H) 08/06/2018   NA 139 08/06/2018   K 4.9 08/06/2018   CL 107 08/06/2018   CO2 24 08/06/2018    Anesthesia Physical  Anesthesia  Plan  ASA: III  Anesthesia Plan: Bier Block and Bier Block-LIDOCAINE ONLY   Post-op Pain Management:    Induction: Intravenous  PONV Risk Score and Plan: 2 and Ondansetron and Treatment may vary due to age or medical condition  Airway Management Planned: Simple Face Mask  Additional Equipment:   Intra-op Plan:   Post-operative Plan:   Informed Consent: I have reviewed the patients History and Physical, chart, labs and discussed the procedure including the risks, benefits and alternatives for the proposed anesthesia with the patient or authorized representative who has indicated his/her understanding and acceptance.       Plan Discussed with: CRNA and Surgeon  Anesthesia Plan Comments:        Anesthesia Quick Evaluation

## 2018-08-10 ENCOUNTER — Ambulatory Visit (HOSPITAL_BASED_OUTPATIENT_CLINIC_OR_DEPARTMENT_OTHER): Payer: Medicare Other | Admitting: Anesthesiology

## 2018-08-10 ENCOUNTER — Encounter (HOSPITAL_BASED_OUTPATIENT_CLINIC_OR_DEPARTMENT_OTHER)
Admission: RE | Disposition: A | Discharge status: Home / Self Care | Payer: Self-pay | Source: Home / Self Care | Attending: Orthopedic Surgery

## 2018-08-10 ENCOUNTER — Encounter (HOSPITAL_BASED_OUTPATIENT_CLINIC_OR_DEPARTMENT_OTHER): Payer: Self-pay | Admitting: Anesthesiology

## 2018-08-10 ENCOUNTER — Ambulatory Visit (HOSPITAL_BASED_OUTPATIENT_CLINIC_OR_DEPARTMENT_OTHER)
Admission: RE | Admit: 2018-08-10 | Discharge: 2018-08-10 | Disposition: A | Discharge status: Home / Self Care | Payer: Medicare Other | Attending: Orthopedic Surgery | Admitting: Orthopedic Surgery

## 2018-08-10 ENCOUNTER — Other Ambulatory Visit: Payer: Self-pay

## 2018-08-10 DIAGNOSIS — M19042 Primary osteoarthritis, left hand: Secondary | ICD-10-CM | POA: Insufficient documentation

## 2018-08-10 DIAGNOSIS — I252 Old myocardial infarction: Secondary | ICD-10-CM | POA: Insufficient documentation

## 2018-08-10 DIAGNOSIS — Z7984 Long term (current) use of oral hypoglycemic drugs: Secondary | ICD-10-CM | POA: Diagnosis not present

## 2018-08-10 DIAGNOSIS — Z6826 Body mass index (BMI) 26.0-26.9, adult: Secondary | ICD-10-CM | POA: Diagnosis not present

## 2018-08-10 DIAGNOSIS — I350 Nonrheumatic aortic (valve) stenosis: Secondary | ICD-10-CM | POA: Insufficient documentation

## 2018-08-10 DIAGNOSIS — M19041 Primary osteoarthritis, right hand: Secondary | ICD-10-CM | POA: Insufficient documentation

## 2018-08-10 DIAGNOSIS — E669 Obesity, unspecified: Secondary | ICD-10-CM | POA: Insufficient documentation

## 2018-08-10 DIAGNOSIS — I10 Essential (primary) hypertension: Secondary | ICD-10-CM | POA: Diagnosis not present

## 2018-08-10 DIAGNOSIS — I2510 Atherosclerotic heart disease of native coronary artery without angina pectoris: Secondary | ICD-10-CM | POA: Diagnosis not present

## 2018-08-10 DIAGNOSIS — E119 Type 2 diabetes mellitus without complications: Secondary | ICD-10-CM | POA: Diagnosis not present

## 2018-08-10 DIAGNOSIS — M65352 Trigger finger, left little finger: Secondary | ICD-10-CM | POA: Diagnosis not present

## 2018-08-10 DIAGNOSIS — K219 Gastro-esophageal reflux disease without esophagitis: Secondary | ICD-10-CM | POA: Insufficient documentation

## 2018-08-10 DIAGNOSIS — R972 Elevated prostate specific antigen [PSA]: Secondary | ICD-10-CM | POA: Diagnosis not present

## 2018-08-10 DIAGNOSIS — M65312 Trigger thumb, left thumb: Secondary | ICD-10-CM | POA: Diagnosis not present

## 2018-08-10 DIAGNOSIS — E785 Hyperlipidemia, unspecified: Secondary | ICD-10-CM | POA: Insufficient documentation

## 2018-08-10 DIAGNOSIS — K589 Irritable bowel syndrome without diarrhea: Secondary | ICD-10-CM | POA: Insufficient documentation

## 2018-08-10 DIAGNOSIS — I4510 Unspecified right bundle-branch block: Secondary | ICD-10-CM | POA: Diagnosis not present

## 2018-08-10 DIAGNOSIS — M65842 Other synovitis and tenosynovitis, left hand: Secondary | ICD-10-CM | POA: Diagnosis not present

## 2018-08-10 DIAGNOSIS — Z87891 Personal history of nicotine dependence: Secondary | ICD-10-CM | POA: Insufficient documentation

## 2018-08-10 DIAGNOSIS — I25110 Atherosclerotic heart disease of native coronary artery with unstable angina pectoris: Secondary | ICD-10-CM | POA: Diagnosis not present

## 2018-08-10 HISTORY — PX: TRIGGER FINGER RELEASE: SHX641

## 2018-08-10 HISTORY — DX: Gastro-esophageal reflux disease without esophagitis: K21.9

## 2018-08-10 HISTORY — DX: Unspecified osteoarthritis, unspecified site: M19.90

## 2018-08-10 LAB — GLUCOSE, CAPILLARY
Glucose-Capillary: 223 mg/dL — ABNORMAL HIGH (ref 70–99)
Glucose-Capillary: 167 mg/dL — ABNORMAL HIGH (ref 70–99)

## 2018-08-10 SURGERY — RELEASE, A1 PULLEY, FOR TRIGGER FINGER
Anesthesia: Regional | Site: Hand | Laterality: Left

## 2018-08-10 MED ORDER — MIDAZOLAM HCL 2 MG/2ML IJ SOLN: 1.0000 mg | INTRAMUSCULAR | Status: DC | PRN

## 2018-08-10 MED ORDER — ONDANSETRON HCL 4 MG/2ML IJ SOLN: 4.0000 mg | Freq: Once | INTRAMUSCULAR | Status: DC | PRN

## 2018-08-10 MED ORDER — TRAMADOL HCL 50 MG PO TABS: 50.0000 mg | ORAL_TABLET | Freq: Four times a day (QID) | ORAL | 0 refills | Status: DC | PRN

## 2018-08-10 MED ORDER — FENTANYL CITRATE (PF) 100 MCG/2ML IJ SOLN
50.0000 ug | INTRAMUSCULAR | Status: DC | PRN
  Administered 2018-08-10: 100 ug via INTRAVENOUS

## 2018-08-10 MED ORDER — BUPIVACAINE HCL (PF) 0.25 % IJ SOLN
INTRAMUSCULAR | Status: AC
  Filled 2018-08-10: qty 60

## 2018-08-10 MED ORDER — OXYCODONE HCL 5 MG PO TABS: 5.0000 mg | ORAL_TABLET | Freq: Once | ORAL | Status: DC | PRN

## 2018-08-10 MED ORDER — HYDROMORPHONE HCL 1 MG/ML IJ SOLN: 0.2500 mg | INTRAMUSCULAR | Status: DC | PRN

## 2018-08-10 MED ORDER — CEFAZOLIN SODIUM-DEXTROSE 2-4 GM/100ML-% IV SOLN
INTRAVENOUS | Status: AC
  Filled 2018-08-10: qty 100

## 2018-08-10 MED ORDER — MEPERIDINE HCL 25 MG/ML IJ SOLN: 6.2500 mg | INTRAMUSCULAR | Status: DC | PRN

## 2018-08-10 MED ORDER — PROPOFOL 500 MG/50ML IV EMUL
INTRAVENOUS | Status: DC | PRN
  Administered 2018-08-10: 50 ug/kg/min via INTRAVENOUS

## 2018-08-10 MED ORDER — LACTATED RINGERS IV SOLN
INTRAVENOUS | Status: DC
  Administered 2018-08-10: 08:00:00 via INTRAVENOUS

## 2018-08-10 MED ORDER — ACETAMINOPHEN 160 MG/5ML PO SOLN: 325.0000 mg | ORAL | Status: DC | PRN

## 2018-08-10 MED ORDER — OXYCODONE HCL 5 MG/5ML PO SOLN: 5.0000 mg | Freq: Once | ORAL | Status: DC | PRN

## 2018-08-10 MED ORDER — ACETAMINOPHEN 325 MG PO TABS: 325.0000 mg | ORAL_TABLET | ORAL | Status: DC | PRN

## 2018-08-10 MED ORDER — BUPIVACAINE HCL (PF) 0.25 % IJ SOLN
INTRAMUSCULAR | Status: DC | PRN
  Administered 2018-08-10: 8 mL

## 2018-08-10 MED ORDER — CHLORHEXIDINE GLUCONATE 4 % EX LIQD: 60.0000 mL | Freq: Once | CUTANEOUS | Status: DC

## 2018-08-10 MED ORDER — SCOPOLAMINE 1 MG/3DAYS TD PT72: 1.0000 | MEDICATED_PATCH | Freq: Once | TRANSDERMAL | Status: DC

## 2018-08-10 MED ORDER — CEFAZOLIN SODIUM-DEXTROSE 2-4 GM/100ML-% IV SOLN
2.0000 g | INTRAVENOUS | Status: AC
  Administered 2018-08-10: 2 g via INTRAVENOUS

## 2018-08-10 MED ORDER — FENTANYL CITRATE (PF) 100 MCG/2ML IJ SOLN: 25.0000 ug | INTRAMUSCULAR | Status: DC | PRN

## 2018-08-10 MED ORDER — FENTANYL CITRATE (PF) 100 MCG/2ML IJ SOLN
INTRAMUSCULAR | Status: AC
  Filled 2018-08-10: qty 2

## 2018-08-10 MED ORDER — LIDOCAINE HCL (PF) 0.5 % IJ SOLN
INTRAMUSCULAR | Status: DC | PRN
  Administered 2018-08-10: 30 mL via INTRAVENOUS

## 2018-08-10 MED ORDER — PROPOFOL 10 MG/ML IV BOLUS
INTRAVENOUS | Status: DC | PRN
  Administered 2018-08-10: 20 mg via INTRAVENOUS

## 2018-08-10 SURGICAL SUPPLY — 36 items
APL PRP STRL LF DISP 70% ISPRP (MISCELLANEOUS) ×1
BLADE SURG 15 STRL LF DISP TIS (BLADE) ×1 IMPLANT
BLADE SURG 15 STRL SS (BLADE) ×2
BNDG CMPR 9X4 STRL LF SNTH (GAUZE/BANDAGES/DRESSINGS)
BNDG COHESIVE 2X5 TAN STRL LF (GAUZE/BANDAGES/DRESSINGS) ×2 IMPLANT
BNDG ESMARK 4X9 LF (GAUZE/BANDAGES/DRESSINGS) IMPLANT
CHLORAPREP W/TINT 26 (MISCELLANEOUS) ×2 IMPLANT
CORD BIPOLAR FORCEPS 12FT (ELECTRODE) IMPLANT
COVER BACK TABLE REUSABLE LG (DRAPES) ×2 IMPLANT
COVER MAYO STAND REUSABLE (DRAPES) ×2 IMPLANT
COVER WAND RF STERILE (DRAPES) IMPLANT
CUFF TOURN SGL QUICK 18X4 (TOURNIQUET CUFF) ×1 IMPLANT
DECANTER SPIKE VIAL GLASS SM (MISCELLANEOUS) IMPLANT
DRAPE EXTREMITY T 121X128X90 (DISPOSABLE) ×2 IMPLANT
DRAPE SURG 17X23 STRL (DRAPES) ×2 IMPLANT
GAUZE SPONGE 4X4 12PLY STRL (GAUZE/BANDAGES/DRESSINGS) ×2 IMPLANT
GAUZE XEROFORM 1X8 LF (GAUZE/BANDAGES/DRESSINGS) ×2 IMPLANT
GLOVE BIO SURGEON STRL SZ 6.5 (GLOVE) ×1 IMPLANT
GLOVE BIOGEL PI IND STRL 7.0 (GLOVE) IMPLANT
GLOVE BIOGEL PI IND STRL 8.5 (GLOVE) ×1 IMPLANT
GLOVE BIOGEL PI INDICATOR 7.0 (GLOVE) ×2
GLOVE BIOGEL PI INDICATOR 8.5 (GLOVE) ×1
GLOVE SURG ORTHO 8.0 STRL STRW (GLOVE) ×2 IMPLANT
GOWN STRL REUS W/ TWL LRG LVL3 (GOWN DISPOSABLE) ×1 IMPLANT
GOWN STRL REUS W/TWL LRG LVL3 (GOWN DISPOSABLE) ×2
GOWN STRL REUS W/TWL XL LVL3 (GOWN DISPOSABLE) ×2 IMPLANT
NDL PRECISIONGLIDE 27X1.5 (NEEDLE) ×1 IMPLANT
NEEDLE PRECISIONGLIDE 27X1.5 (NEEDLE) ×2 IMPLANT
NS IRRIG 1000ML POUR BTL (IV SOLUTION) ×2 IMPLANT
PACK BASIN DAY SURGERY FS (CUSTOM PROCEDURE TRAY) ×2 IMPLANT
STOCKINETTE 4X48 STRL (DRAPES) ×2 IMPLANT
SUT ETHILON 4 0 PS 2 18 (SUTURE) ×2 IMPLANT
SYR BULB 3OZ (MISCELLANEOUS) ×2 IMPLANT
SYR CONTROL 10ML LL (SYRINGE) ×2 IMPLANT
TOWEL GREEN STERILE FF (TOWEL DISPOSABLE) ×3 IMPLANT
UNDERPAD 30X30 (UNDERPADS AND DIAPERS) ×2 IMPLANT

## 2018-08-10 NOTE — Op Note (Signed)
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   ASSISTANT: none   ANESTHESIA:  Bier block with sedation and Local   INTRAVENOUS FLUIDS:  Per anesthesia flow sheet.   ESTIMATED BLOOD LOSS:  Minimal.   COMPLICATIONS:  None.   SPECIMENS:  none   TOURNIQUET TIME:    Total Tourniquet Time Documented: Forearm (Left) - 24 minutes Total: Forearm (Left) - 24 minutes    DISPOSITION:  Stable to PACU.   INDICATIONS: Patient is a 78 year old male with a history of triggering of multiple fingers but recently has developed locked left thumb and triggering of his left small finger.  This not responded to conservative treatment he is elected not to undergo surgical release of the A1 pulley of both thumb and small fingers.  Pre-peri-and postoperative course been discussed along with risks and complications.  He is aware that there is no guarantee to the surgery the possibility of infection recurrence injury to arteries nerves tendons incomplete relief of symptoms and dystrophy.  In the preoperative area the patient has seen the extremity marked by both patient and surgeon and antibiotic given  OPERATIVE COURSE: Patient brought to the operating room where form based IV regional anesthetic was carried out without difficulty under the direction of the anesthesia department.  He was prepped using ChloraPrep and in supine position with a left arm free.  A three-minute dry time was allowed and a timeout taken confirming patient procedure.  Adequate anesthesia was afforded an oblique incision was made over the A1 pulley of the left  small finger carried down through subcutaneous tissue.  Retractors were placed retracting neurovascular bundles radially and ulnarly a thickened A1 pulley was immediately identified.  This was released on its radial aspect.  A small incision was made centrally and A2.  The tenosynovium tissue proximally was separated with blunt dissection 2.  The 2 tendons were then separated with retractors breaking any adhesions.  The finger was placed through full pain range of motion no further triggering was noted.  The wound was copiously irrigated with saline and closed with interrupted 4-0 nylon sutures.  The thumb was attended to next.  A transverse incision was made over the metacarpal phalangeal joint crease.  This carried down through subcutaneous tissue tissue.  Moderate scar was present.  Tenosynovial tissue was separated allowing visualization of the proximal aspect of the A1 pulley which was markedly thickened.  The neurovascular bundles were retracted radially and ulnarly.  The A1 pulley was released on its radial aspect.  The oblique pulley was left intact.  Significant swelling of the FPL tendon was noted.  The thumb immediately came fully straight.  With traction on the FPL tendon with a right angle retractor the thumb was able to move through a full range of motion.  Wound was copiously irrigated with saline and closed with interrupted 4 nylon sutures.  A local infiltration with quarter percent bupivacaine without epinephrine was given approximately 8 cc total was used.  Sterile compressive dressing with the finger thumb free was applied.  Inflation of the tourniquet all fingers immediately pink.  He was taken  to the recovery room for observation in satisfactory condition.  He will be discharged home to return hand center [facility] in 1 week on Tylenol ibuprofen for pain with Ultram as a backup for breakthrough.  Daryll Brod, MD Electronically signed, 08/10/18

## 2018-08-10 NOTE — Anesthesia Procedure Notes (Signed)
Anesthesia Regional Block: Bier block (IV Regional)   Pre-Anesthetic Checklist: ,, timeout performed, Correct Patient, Correct Site, Correct Laterality, Correct Procedure,, site marked, surgical consent,, at surgeon's request  Laterality: Left     Needles:  Injection technique: Single-shot  Needle Type: Other      Needle Gauge: 22     Additional Needles:   Procedures:,,,,, intact distal pulses, Esmarch exsanguination, single tourniquet utilized,  Narrative:  Start time: 08/10/2018 8:43 AM End time: 08/10/2018 8:43 AM  Performed by: Personally

## 2018-08-10 NOTE — Discharge Instructions (Addendum)

## 2018-08-10 NOTE — H&P (Signed)
Barry Zavala is an 78 y.o. male.   Chief Complaint: Catching thumb and small fingers left hand HPI: Barry Zavala is a 77 yo male with triggering left thumb left small finger.  He has had injections x2 to the thumb but it has locked over the past month. He continues complain of the catching of his small finger and has developed an inability to fully extend it at the PIP joint. States this occurred due to not coming back from the last visit due to [place]. He has a history of diabetes. He has undergone carpal tunnel release in the past. He has a history of hypertension cataracts. And a myocardial infarction. He has no history of arthritis gout. No history of thyroid problems.   Past Medical History:  Diagnosis Date  . Anemia   . Aortic stenosis, moderate   . Arthritis    hands  . Coronary artery disease   . Elevated PSA   . Erectile dysfunction   . GERD (gastroesophageal reflux disease)   . Hyperlipidemia   . Hypertension   . Irritable bowel syndrome   . Obesity   . Onychomycosis   . Palmar fibromatosis   . Right bundle branch block   . Type 2 diabetes mellitus ([place])     Past Surgical History:  Procedure Laterality Date  . APPENDECTOMY    . cardiac stents    . CARPAL TUNNEL RELEASE Left 12/09/2016   Procedure: CARPAL TUNNEL RELEASE;  Surgeon: Daryll Brod, MD;  Location: [place];  Service: Orthopedics;  Laterality: Left;  . CARPAL TUNNEL WITH CUBITAL TUNNEL Right 01/13/2017   Procedure: RIGHT CARPAL TUNNEL WITH CUBITAL TUNNEL RELEASE;  Surgeon: Daryll Brod, MD;  Location: [place];  Service: Orthopedics;  Laterality: Right;  . ULNAR NERVE TRANSPOSITION Left 12/09/2016   Procedure: DECOMPRESSION LEFT ULNAR NERVE;  Surgeon: Daryll Brod, MD;  Location: [place];  Service: Orthopedics;  Laterality: Left;  . ULNAR NERVE TRANSPOSITION Right 01/13/2017   Procedure: DECOMPRESSION ULNAR NERVE;  Surgeon: Daryll Brod, MD;  Location: [place];  Service: Orthopedics;  Laterality: Right;    Family History  Problem Relation Age of Onset  . Ovarian cancer Mother 78  . Heart attack Father   . Cancer Paternal Uncle        [place] Sarcoma  . Multiple myeloma Sister 68   Social History:  reports that he quit smoking about 32 years ago. His smoking use included cigarettes. He has a 20.00 pack-year smoking history. He has never used smokeless tobacco. He reports current alcohol use. He reports that he does not use drugs.  Allergies:  Allergies  Allergen Reactions  . Other Other (See Comments)    Agent: Vit C,e,zn,cu-omega3-lut-zeax Reaction: GI upset Agent: Vit C,e,zn,cu-omega3-lut-zeax Reaction: GI upset    No medications prior to admission.    No results found for this or any previous visit (from the past 48 hour(s)).  No results found.   Pertinent items are noted in HPI.  Height '5\' 11"'$  (1.803 m), weight 86.2 kg.  General appearance: alert, cooperative and appears stated age Head: Normocephalic, without obvious abnormality Neck: no JVD Resp: clear to auscultation bilaterally Cardio: regular rate and rhythm, S1, S2 normal, no murmur, click, rub or gallop GI: soft, non-tender; bowel sounds normal; no masses,  no organomegaly Extremities: Complaint: Catching thumb and small fingers left hand Pulses: 2+ and symmetric Skin: Skin color, texture, turgor normal. No rashes or lesions Neurologic: Grossly normal Incision/Wound:  na  Assessment/Plan Assessment:  1. Trigger finger of left thumb  2. Trigger little finger of left hand    Plan: We have discussed surgical release of each of the A1 pulleys to the left thumb and left small finger. Pre-peri-and postoperative course are discussed along with risks and complications. He is aware there is no guarantee to the surgery possibility of infection recurrence injury to arteries nerves tendons complete relief symptoms dystrophy. He is advised that the flexion deformity  to the finger will be dealt with post surgery by therapy. He is scheduled for release A1 pulley left thumb left small finger as an outpatient under regional anesthesia.     Daryll Brod 08/10/2018, 5:37 AM

## 2018-08-10 NOTE — Brief Op Note (Signed)
08/10/2018  9:13 AM  PATIENT:  Barry Zavala  78 y.o. male  PRE-OPERATIVE DIAGNOSIS:  LEFT THUMB   LEFT SMALL TRIGGER FINGER  POST-OPERATIVE DIAGNOSIS:  LEFT THUMB   LEFT SMALL TRIGGER FINGER  PROCEDURE:  Procedure(s) with comments: RELEASE TRIGGER FINGER/A-1 PULLEY THUMB AND LEFT SMALL (Left) - FAB  SURGEON:  Surgeon(s) and Role:    Daryll Brod, MD - Primary  PHYSICIAN ASSISTANT:   ASSISTANTS: none   ANESTHESIA:   local, regional and IV sedation  EBL: 79ml   BLOOD ADMINISTERED:none  DRAINS: none   LOCAL MEDICATIONS USED:  BUPIVICAINE   SPECIMEN:  No Specimen  DISPOSITION OF SPECIMEN:  N/A  COUNTS:  YES  TOURNIQUET:   Total Tourniquet Time Documented: Forearm (Left) - 24 minutes Total: Forearm (Left) - 24 minutes   DICTATION: .Dragon Dictation  PLAN OF CARE: Discharge to home after PACU  PATIENT DISPOSITION:  PACU - hemodynamically stable.

## 2018-08-10 NOTE — Anesthesia Postprocedure Evaluation (Signed)
Anesthesia Post Note  Patient: Barry Zavala  Procedure(s) Performed: RELEASE TRIGGER FINGER/A-1 PULLEY THUMB AND LEFT SMALL (Left Hand)     Patient location during evaluation: PACU Anesthesia Type: Bier Block Level of consciousness: awake and alert and patient cooperative Pain management: pain level controlled Vital Signs Assessment: post-procedure vital signs reviewed and stable Respiratory status: spontaneous breathing and respiratory function stable Cardiovascular status: stable Anesthetic complications: no    Last Vitals:  Vitals:   08/10/18 0945 08/10/18 1020  BP: 115/62 130/80  Pulse: 62 65  Resp: 17 18  Temp:  36.6 C  SpO2: 100% 100%    Last Pain:  Vitals:   08/10/18 1020  TempSrc:   PainSc: 0-No pain                 Colin Norment DAVID

## 2018-08-10 NOTE — Transfer of Care (Signed)
Immediate Anesthesia Transfer of Care Note  Patient: Barry Zavala  Procedure(s) Performed: RELEASE TRIGGER FINGER/A-1 PULLEY THUMB AND LEFT SMALL (Left Hand)  Patient Location: PACU  Anesthesia Type:MAC and Bier block  Level of Consciousness: sedated  Airway & Oxygen Therapy: Patient Spontanous Breathing and Patient connected to nasal cannula oxygen  Post-op Assessment: Report given to RN and Post -op Vital signs reviewed and stable  Post vital signs: Reviewed and stable  Last Vitals:  Vitals Value Taken Time  BP 110/63 08/10/18 0915  Temp    Pulse 65 08/10/18 0915  Resp 15 08/10/18 0915  SpO2 100 % 08/10/18 0915  Vitals shown include unvalidated device data.  Last Pain:  Vitals:   08/10/18 0728  TempSrc: Oral  PainSc: 0-No pain         Complications: No apparent anesthesia complications

## 2018-08-11 ENCOUNTER — Encounter (HOSPITAL_BASED_OUTPATIENT_CLINIC_OR_DEPARTMENT_OTHER): Payer: Self-pay | Admitting: Orthopedic Surgery

## 2018-08-23 DIAGNOSIS — E782 Mixed hyperlipidemia: Secondary | ICD-10-CM | POA: Diagnosis not present

## 2018-08-23 DIAGNOSIS — N183 Chronic kidney disease, stage 3 (moderate): Secondary | ICD-10-CM | POA: Diagnosis not present

## 2018-08-23 DIAGNOSIS — E7800 Pure hypercholesterolemia, unspecified: Secondary | ICD-10-CM | POA: Diagnosis not present

## 2018-08-23 DIAGNOSIS — E119 Type 2 diabetes mellitus without complications: Secondary | ICD-10-CM | POA: Diagnosis not present

## 2018-08-23 DIAGNOSIS — I10 Essential (primary) hypertension: Secondary | ICD-10-CM | POA: Diagnosis not present

## 2018-08-23 DIAGNOSIS — I2510 Atherosclerotic heart disease of native coronary artery without angina pectoris: Secondary | ICD-10-CM | POA: Diagnosis not present

## 2018-08-24 ENCOUNTER — Telehealth: Payer: Self-pay

## 2018-08-24 ENCOUNTER — Other Ambulatory Visit: Payer: Self-pay

## 2018-08-24 NOTE — Telephone Encounter (Signed)
**Note De-Identified Ayaana Biondo Obfuscation** I called the pt to ask if he takes name brand Vytorin or the generic form as both have been refilled for him per his chart. He states that he takes name brand Vytorin as "None of the rest have worked" but would not explain any further. There are no other statins listed in the history of his med list and no allergies to any statins are listed.  He is advised that I will attempt this non-formulary name brand Vytorin PA.

## 2018-08-24 NOTE — Telephone Encounter (Signed)
° ° °  Rhea from [employer] to give approval on Zytorin, starting today. Patient was notified  Berton Lan (phone) [phone]

## 2018-08-25 NOTE — Telephone Encounter (Signed)
**Note De-Identified Girtrude Enslin Obfuscation** Following message received from covermymeds:  Oneal Grout Key: [id number] Outcome  Approved on August 18  Effective from 08/24/2018 through 08/24/2019.

## 2018-09-02 DIAGNOSIS — E119 Type 2 diabetes mellitus without complications: Secondary | ICD-10-CM | POA: Diagnosis not present

## 2018-09-02 DIAGNOSIS — Z961 Presence of intraocular lens: Secondary | ICD-10-CM | POA: Diagnosis not present

## 2018-09-02 DIAGNOSIS — H524 Presbyopia: Secondary | ICD-10-CM | POA: Diagnosis not present

## 2018-09-06 DIAGNOSIS — E7849 Other hyperlipidemia: Secondary | ICD-10-CM | POA: Diagnosis not present

## 2018-09-06 DIAGNOSIS — Z23 Encounter for immunization: Secondary | ICD-10-CM | POA: Diagnosis not present

## 2018-09-06 DIAGNOSIS — E1121 Type 2 diabetes mellitus with diabetic nephropathy: Secondary | ICD-10-CM | POA: Diagnosis not present

## 2018-09-10 DIAGNOSIS — E1121 Type 2 diabetes mellitus with diabetic nephropathy: Secondary | ICD-10-CM | POA: Diagnosis not present

## 2018-09-10 DIAGNOSIS — I5189 Other ill-defined heart diseases: Secondary | ICD-10-CM | POA: Diagnosis not present

## 2018-09-10 DIAGNOSIS — E785 Hyperlipidemia, unspecified: Secondary | ICD-10-CM | POA: Diagnosis not present

## 2018-09-10 DIAGNOSIS — N183 Chronic kidney disease, stage 3 (moderate): Secondary | ICD-10-CM | POA: Diagnosis not present

## 2018-09-10 DIAGNOSIS — I129 Hypertensive chronic kidney disease with stage 1 through stage 4 chronic kidney disease, or unspecified chronic kidney disease: Secondary | ICD-10-CM | POA: Diagnosis not present

## 2018-09-10 DIAGNOSIS — I779 Disorder of arteries and arterioles, unspecified: Secondary | ICD-10-CM | POA: Diagnosis not present

## 2018-09-10 DIAGNOSIS — I350 Nonrheumatic aortic (valve) stenosis: Secondary | ICD-10-CM | POA: Diagnosis not present

## 2018-09-10 DIAGNOSIS — I2510 Atherosclerotic heart disease of native coronary artery without angina pectoris: Secondary | ICD-10-CM | POA: Diagnosis not present

## 2018-09-14 DIAGNOSIS — Z23 Encounter for immunization: Secondary | ICD-10-CM | POA: Diagnosis not present

## 2018-10-05 ENCOUNTER — Other Ambulatory Visit: Payer: Self-pay

## 2018-10-05 DIAGNOSIS — Z20822 Contact with and (suspected) exposure to covid-19: Secondary | ICD-10-CM

## 2018-10-06 LAB — NOVEL CORONAVIRUS, NAA: SARS-CoV-2, NAA: NOT DETECTED

## 2018-10-26 DIAGNOSIS — R21 Rash and other nonspecific skin eruption: Secondary | ICD-10-CM | POA: Diagnosis not present

## 2018-11-10 DIAGNOSIS — L309 Dermatitis, unspecified: Secondary | ICD-10-CM | POA: Diagnosis not present

## 2018-12-01 DIAGNOSIS — L111 Transient acantholytic dermatosis [Grover]: Secondary | ICD-10-CM | POA: Diagnosis not present

## 2018-12-01 DIAGNOSIS — L821 Other seborrheic keratosis: Secondary | ICD-10-CM | POA: Diagnosis not present

## 2018-12-10 DIAGNOSIS — E1129 Type 2 diabetes mellitus with other diabetic kidney complication: Secondary | ICD-10-CM | POA: Diagnosis not present

## 2019-01-07 HISTORY — PX: CARDIAC CATHETERIZATION: SHX172

## 2019-01-13 ENCOUNTER — Other Ambulatory Visit: Payer: Self-pay

## 2019-01-13 ENCOUNTER — Ambulatory Visit (HOSPITAL_COMMUNITY): Payer: Medicare Other | Attending: Cardiology

## 2019-01-13 DIAGNOSIS — R0989 Other specified symptoms and signs involving the circulatory and respiratory systems: Secondary | ICD-10-CM | POA: Diagnosis not present

## 2019-01-13 DIAGNOSIS — I10 Essential (primary) hypertension: Secondary | ICD-10-CM

## 2019-01-13 DIAGNOSIS — E119 Type 2 diabetes mellitus without complications: Secondary | ICD-10-CM | POA: Diagnosis not present

## 2019-01-13 DIAGNOSIS — I350 Nonrheumatic aortic (valve) stenosis: Secondary | ICD-10-CM | POA: Insufficient documentation

## 2019-01-13 DIAGNOSIS — N183 Chronic kidney disease, stage 3 unspecified: Secondary | ICD-10-CM | POA: Insufficient documentation

## 2019-01-13 DIAGNOSIS — I2510 Atherosclerotic heart disease of native coronary artery without angina pectoris: Secondary | ICD-10-CM | POA: Insufficient documentation

## 2019-01-13 DIAGNOSIS — Z9861 Coronary angioplasty status: Secondary | ICD-10-CM | POA: Diagnosis not present

## 2019-01-13 DIAGNOSIS — I4510 Unspecified right bundle-branch block: Secondary | ICD-10-CM | POA: Diagnosis not present

## 2019-01-17 NOTE — H&P (View-Only) (Signed)
Cardiology Office Note:    Date:  01/18/2019   ID:  Barry Zavala, DOB 01/07/1941, MRN [id number]  PCP:  Josetta Huddle, MD  Cardiologist:  Sinclair Grooms, MD   Referring MD: Josetta Huddle, MD   Chief Complaint  Patient presents with  . Coronary Artery Disease  . Cardiac Valve Problem    History of Present Illness:    Barry Zavala is a 79 y.o. male with a hx of remote MI in 1995, and PCI in 2001. Myoview in Feb 2017 was low risk, NIDDM, HTN, HLD, CRI, significant aortic stenosis, and RBBB.   Feels well.  Has no cardiopulmonary symptoms: Denies syncope, dyspnea on exertion, exertional endurance, angina, and orthopnea.  He has history of prior coronary stent implantation in the setting of ACS/MI.  Stents were remote.  No medication side effects.  He does have CKD with a creatinine of 1.5 in December.  LDL at that time was 59.  Past Medical History:  Diagnosis Date  . Anemia   . Aortic stenosis, moderate   . Arthritis    hands  . Coronary artery disease   . Elevated PSA   . Erectile dysfunction   . GERD (gastroesophageal reflux disease)   . Hyperlipidemia   . Hypertension   . Irritable bowel syndrome   . Obesity   . Onychomycosis   . Palmar fibromatosis   . Right bundle branch block   . Type 2 diabetes mellitus ([place])     Past Surgical History:  Procedure Laterality Date  . APPENDECTOMY    . cardiac stents    . CARPAL TUNNEL RELEASE Left 12/09/2016   Procedure: CARPAL TUNNEL RELEASE;  Surgeon: Daryll Brod, MD;  Location: [place];  Service: Orthopedics;  Laterality: Left;  . CARPAL TUNNEL WITH CUBITAL TUNNEL Right 01/13/2017   Procedure: RIGHT CARPAL TUNNEL WITH CUBITAL TUNNEL RELEASE;  Surgeon: Daryll Brod, MD;  Location: [place];  Service: Orthopedics;  Laterality: Right;  . TRIGGER FINGER RELEASE Left 08/10/2018   Procedure: RELEASE TRIGGER FINGER/A-1 PULLEY THUMB AND LEFT SMALL;  Surgeon: Daryll Brod, MD;  Location: [facility];  Service: Orthopedics;  Laterality: Left;  FAB  . ULNAR NERVE TRANSPOSITION Left 12/09/2016   Procedure: DECOMPRESSION LEFT ULNAR NERVE;  Surgeon: Daryll Brod, MD;  Location: [facility];  Service: Orthopedics;  Laterality: Left;  . ULNAR NERVE TRANSPOSITION Right 01/13/2017   Procedure: DECOMPRESSION ULNAR NERVE;  Surgeon: Daryll Brod, MD;  Location: [facility];  Service: Orthopedics;  Laterality: Right;    Current Medications: Current Meds  Medication Sig  . ACTOS 45 MG tablet Take 45 mg by mouth daily.  Marland Kitchen aspirin EC 81 MG tablet Take 1 tablet (81 mg total) by mouth daily.  . Empagliflozin (JARDIANCE) 10 MG TABS Take by mouth.  . Ergocalciferol (VITAMIN D2) 2000 units TABS Take 2,000 Units by mouth daily.  . Glucosamine-Chondroitin (GLUCOSAMINE CHONDR COMPLEX PO) Take 1 capsule by mouth 2 (two) times daily.  Marland Kitchen glyBURIDE-metformin (GLUCOVANCE) 5-500 MG per tablet Take 2 tablets by mouth 2 (two) times daily with a meal.  . metoprolol succinate (TOPROL-XL) 50 MG 24 hr tablet Take 50 mg by mouth daily. Take with or immediately following a meal.  . Multiple Vitamins-Minerals (CENTRUM SILVER PO) Take 1 tablet by mouth daily.  Marland Kitchen omega-3 acid ethyl esters (LOVAZA) 1 G capsule Take 1 g by mouth 3 (three) times daily.  Marland Kitchen omeprazole (PRILOSEC) 20 MG capsule Take  20 mg by mouth 2 (two) times a week.   . ONGLYZA 5 MG TABS tablet Take 5 mg by mouth daily.   . ramipril (ALTACE) 10 MG capsule Take 10 mg by mouth daily.  Marland Kitchen VYTORIN 10-20 MG tablet TAKE 1 TABLET BY MOUTH DAILY.     Allergies:   Other   Social History   Socioeconomic History  . Marital status: Married    Spouse name: Not on file  . Number of children: 2  . Years of education: Not on file  . Highest education level: Not on file  Occupational History  . Not on file  Tobacco Use  . Smoking status: Former Smoker    Packs/day: 1.00    Years: 20.00    Pack years: 20.00    Types:  Cigarettes    Quit date: 1988    Years since quitting: 33.0  . Smokeless tobacco: Never Used  Substance and Sexual Activity  . Alcohol use: Yes    Alcohol/week: 0.0 standard drinks    Comment: social  . Drug use: No  . Sexual activity: Not on file  Other Topics Concern  . Not on file  Social History Narrative  . Not on file   Social Determinants of Health   Financial Resource Strain:   . Difficulty of Paying Living Expenses: Not on file  Food Insecurity:   . Worried About Charity fundraiser in the Last Year: Not on file  . Ran Out of Food in the Last Year: Not on file  Transportation Needs:   . Lack of Transportation (Medical): Not on file  . Lack of Transportation (Non-Medical): Not on file  Physical Activity:   . Days of Exercise per Week: Not on file  . Minutes of Exercise per Session: Not on file  Stress:   . Feeling of Stress : Not on file  Social Connections:   . Frequency of Communication with Friends and Family: Not on file  . Frequency of Social Gatherings with Friends and Family: Not on file  . Attends Religious Services: Not on file  . Active Member of Clubs or Organizations: Not on file  . Attends Archivist Meetings: Not on file  . Marital Status: Not on file     Family History: The patient's family history includes Cancer in his paternal uncle; Heart attack in his father; Multiple myeloma (age of onset: 85) in his sister; Ovarian cancer (age of onset: 67) in his mother.  ROS:   Please see the history of present illness.    No specific complaints.  All other systems reviewed and are negative.  EKGs/Labs/Other Studies Reviewed:    The following studies were reviewed today: ECHOCARDIOGRAM 2021: IMPRESSIONS    1. The aortic valve is abnormal. Severe calcifications. Aortic valve regurgitation is not visualized. Severe aortic valve stenosis. Vmax 4.2 m/s, MG 43 mmHg, AVA 0.8 cm^2, DI 0.28  2. Left ventricular ejection fraction, by visual  estimation, is 60 to 65%. The left ventricle has normal function. There is mildly increased left ventricular hypertrophy.  3. The average left ventricular global longitudinal strain is -20.9 %  4. Left ventricular diastolic parameters are indeterminate.  5. Global right ventricle has normal systolic function.The right ventricular size is normal.  6. Left atrial size was normal.  7. Right atrial size was normal.  8. The mitral valve is normal in structure. No evidence of mitral valve regurgitation.  9. The tricuspid valve is normal in structure. 10. The  pulmonic valve was not well visualized. Pulmonic valve regurgitation is trivial. 11. The inferior vena cava is normal in size with greater than 50% respiratory variability, suggesting right atrial pressure of 3 mmHg. 12. The tricuspid regurgitant velocity is 2.37 m/s, and with an assumed right atrial pressure of 3 mmHg, the estimated right ventricular systolic pressure is normal at 25.5 mmHg. 13. Compared to prior TTE 12/25/17, aortic stenosis has worsened, now in severe range  EKG:  EKG right bundle, leftward axis, normal sinus rhythm.  Recent Labs: 08/06/2018: BUN 29; Creatinine, Ser 1.84; Potassium 4.9; Sodium 139  Recent Lipid Panel    Component Value Date/Time   CHOL 83 (L) 01/24/2015 0949   TRIG 49 01/24/2015 0949   HDL 38 (L) 01/24/2015 0949   CHOLHDL 2.2 01/24/2015 0949   VLDL 10 01/24/2015 0949   LDLCALC 35 01/24/2015 0949    Physical Exam:    VS:  BP 116/60   Pulse 74   Ht 5' 11" (1.803 m)   Wt 190 lb (86.2 kg)   SpO2 96%   BMI 26.50 kg/m     Wt Readings from Last 3 Encounters:  01/18/19 190 lb (86.2 kg)  08/10/18 189 lb 6 oz (85.9 kg)  02/10/18 192 lb (87.1 kg)     GEN: Compatible with age. No acute distress HEENT: Normal NECK: No JVD. LYMPHATICS: No lymphadenopathy CARDIAC: 3/6 to 4/6 crescendo decrescendo murmur of aortic stenosis.  RRR without murmur, gallop, or edema. VASCULAR:  Normal Pulses. No  bruits. RESPIRATORY:  Clear to auscultation without rales, wheezing or rhonchi  ABDOMEN: Soft, non-tender, non-distended, No pulsatile mass, MUSCULOSKELETAL: No deformity  SKIN: Warm and dry NEUROLOGIC:  Alert and oriented x 3 PSYCHIATRIC:  Normal affect   ASSESSMENT:    1. Aortic valve stenosis, etiology of cardiac valve disease unspecified   2. CAD S/P percutaneous coronary angioplasty   3. Essential hypertension   4. Right bundle branch block   5. Non-insulin treated type 2 diabetes mellitus ([place])   6. Educated about COVID-19 virus infection    PLAN:    In order of problems listed above:  1. Severe calcific aortic stenosis.  Most recent transvalvular velocity 4.4 m/s.  I have counseled the patient to begin the process of evaluation for valve therapy.  I have recommended left and right heart cath with coronary angiography then referral to the valve clinic.  Hopefully he will be a candidate for TAVR.  He is informed that if there is severe multivessel CAD given diabetes, that he may end up having surgical revascularization plus aortic valve replacement.  This seems unlikely given his current asymptomatic state. 2. Remote PCI during unstable angina 3. Excellent blood pressure control 4. EKG is unchanged 5. Most recent hemoglobin A1c is not available.  Target is less than 7. 6. The 3W's is being practiced and the first dose of the COVID-19 vaccine has been received.  The patient was counseled to undergo left heart catheterization, coronary angiography, and possible percutaneous coronary intervention with stent implantation. The procedural risks and benefits were discussed in detail. The risks discussed included death, stroke, myocardial infarction, life-threatening bleeding, limb ischemia, kidney injury, allergy, and possible emergency cardiac surgery. The risk of these significant complications were estimated to occur less than 1% of the time. After discussion, the patient has agreed to  proceed.    Medication Adjustments/Labs and Tests Ordered: Current medicines are reviewed at length with the patient today.  Concerns regarding medicines are outlined above.  Orders  Placed This Encounter  Procedures  . EKG 12-Lead   No orders of the defined types were placed in this encounter.   There are no Patient Instructions on file for this visit.   Signed, Sinclair Grooms, MD  01/18/2019 8:58 AM    [place]

## 2019-01-17 NOTE — Progress Notes (Signed)
Cardiology Office Note:    Date:  01/18/2019   ID:  Barry Zavala, DOB 19-Jan-1940, MRN [id number]  PCP:  Josetta Huddle, MD  Cardiologist:  Sinclair Grooms, MD   Referring MD: Josetta Huddle, MD   Chief Complaint  Patient presents with  . Coronary Artery Disease  . Cardiac Valve Problem    History of Present Illness:    Barry Zavala is a 78 y.o. male with a hx of remote MI in 1995, and PCI in 2001. Myoview in Feb 2017 was low risk, NIDDM, HTN, HLD, CRI, significant aortic stenosis, and RBBB.   Feels well.  Has no cardiopulmonary symptoms: Denies syncope, dyspnea on exertion, exertional endurance, angina, and orthopnea.  He has history of prior coronary stent implantation in the setting of ACS/MI.  Stents were remote.  No medication side effects.  He does have CKD with a creatinine of 1.5 in December.  LDL at that time was 59.  Past Medical History:  Diagnosis Date  . Anemia   . Aortic stenosis, moderate   . Arthritis    hands  . Coronary artery disease   . Elevated PSA   . Erectile dysfunction   . GERD (gastroesophageal reflux disease)   . Hyperlipidemia   . Hypertension   . Irritable bowel syndrome   . Obesity   . Onychomycosis   . Palmar fibromatosis   . Right bundle branch block   . Type 2 diabetes mellitus ([place])     Past Surgical History:  Procedure Laterality Date  . APPENDECTOMY    . cardiac stents    . CARPAL TUNNEL RELEASE Left 12/09/2016   Procedure: CARPAL TUNNEL RELEASE;  Surgeon: Daryll Brod, MD;  Location: [facility];  Service: Orthopedics;  Laterality: Left;  . CARPAL TUNNEL WITH CUBITAL TUNNEL Right 01/13/2017   Procedure: RIGHT CARPAL TUNNEL WITH CUBITAL TUNNEL RELEASE;  Surgeon: Daryll Brod, MD;  Location: [facility];  Service: Orthopedics;  Laterality: Right;  . TRIGGER FINGER RELEASE Left 08/10/2018   Procedure: RELEASE TRIGGER FINGER/A-1 PULLEY THUMB AND LEFT SMALL;  Surgeon: Daryll Brod, MD;  Location: [facility];  Service: Orthopedics;  Laterality: Left;  FAB  . ULNAR NERVE TRANSPOSITION Left 12/09/2016   Procedure: DECOMPRESSION LEFT ULNAR NERVE;  Surgeon: Daryll Brod, MD;  Location: [facility];  Service: Orthopedics;  Laterality: Left;  . ULNAR NERVE TRANSPOSITION Right 01/13/2017   Procedure: DECOMPRESSION ULNAR NERVE;  Surgeon: Daryll Brod, MD;  Location: [facility];  Service: Orthopedics;  Laterality: Right;    Current Medications: Current Meds  Medication Sig  . ACTOS 45 MG tablet Take 45 mg by mouth daily.  Marland Kitchen aspirin EC 81 MG tablet Take 1 tablet (81 mg total) by mouth daily.  . Empagliflozin (JARDIANCE) 10 MG TABS Take by mouth.  . Ergocalciferol (VITAMIN D2) 2000 units TABS Take 2,000 Units by mouth daily.  . Glucosamine-Chondroitin (GLUCOSAMINE CHONDR COMPLEX PO) Take 1 capsule by mouth 2 (two) times daily.  Marland Kitchen glyBURIDE-metformin (GLUCOVANCE) 5-500 MG per tablet Take 2 tablets by mouth 2 (two) times daily with a meal.  . metoprolol succinate (TOPROL-XL) 50 MG 24 hr tablet Take 50 mg by mouth daily. Take with or immediately following a meal.  . Multiple Vitamins-Minerals (CENTRUM SILVER PO) Take 1 tablet by mouth daily.  Marland Kitchen omega-3 acid ethyl esters (LOVAZA) 1 G capsule Take 1 g by mouth 3 (three) times daily.  Marland Kitchen omeprazole (PRILOSEC) 20 MG capsule Take  20 mg by mouth 2 (two) times a week.   . ONGLYZA 5 MG TABS tablet Take 5 mg by mouth daily.   . ramipril (ALTACE) 10 MG capsule Take 10 mg by mouth daily.  . VYTORIN 10-20 MG tablet TAKE 1 TABLET BY MOUTH DAILY.     Allergies:   Other   Social History   Socioeconomic History  . Marital status: Married    Spouse name: Not on file  . Number of children: 2  . Years of education: Not on file  . Highest education level: Not on file  Occupational History  . Not on file  Tobacco Use  . Smoking status: Former Smoker    Packs/day: 1.00    Years: 20.00    Pack years: 20.00    Types:  Cigarettes    Quit date: 1988    Years since quitting: 33.0  . Smokeless tobacco: Never Used  Substance and Sexual Activity  . Alcohol use: Yes    Alcohol/week: 0.0 standard drinks    Comment: social  . Drug use: No  . Sexual activity: Not on file  Other Topics Concern  . Not on file  Social History Narrative  . Not on file   Social Determinants of Health   Financial Resource Strain:   . Difficulty of Paying Living Expenses: Not on file  Food Insecurity:   . Worried About Running Out of Food in the Last Year: Not on file  . Ran Out of Food in the Last Year: Not on file  Transportation Needs:   . Lack of Transportation (Medical): Not on file  . Lack of Transportation (Non-Medical): Not on file  Physical Activity:   . Days of Exercise per Week: Not on file  . Minutes of Exercise per Session: Not on file  Stress:   . Feeling of Stress : Not on file  Social Connections:   . Frequency of Communication with Friends and Family: Not on file  . Frequency of Social Gatherings with Friends and Family: Not on file  . Attends Religious Services: Not on file  . Active Member of Clubs or Organizations: Not on file  . Attends Club or Organization Meetings: Not on file  . Marital Status: Not on file     Family History: The patient's family history includes Cancer in his paternal uncle; Heart attack in his father; Multiple myeloma (age of onset: 67) in his sister; Ovarian cancer (age of onset: 63) in his mother.  ROS:   Please see the history of present illness.    No specific complaints.  All other systems reviewed and are negative.  EKGs/Labs/Other Studies Reviewed:    The following studies were reviewed today: ECHOCARDIOGRAM 2021: IMPRESSIONS    1. The aortic valve is abnormal. Severe calcifications. Aortic valve regurgitation is not visualized. Severe aortic valve stenosis. Vmax 4.2 m/s, MG 43 mmHg, AVA 0.8 cm^2, DI 0.28  2. Left ventricular ejection fraction, by visual  estimation, is 60 to 65%. The left ventricle has normal function. There is mildly increased left ventricular hypertrophy.  3. The average left ventricular global longitudinal strain is -20.9 %  4. Left ventricular diastolic parameters are indeterminate.  5. Global right ventricle has normal systolic function.The right ventricular size is normal.  6. Left atrial size was normal.  7. Right atrial size was normal.  8. The mitral valve is normal in structure. No evidence of mitral valve regurgitation.  9. The tricuspid valve is normal in structure. 10. The   pulmonic valve was not well visualized. Pulmonic valve regurgitation is trivial. 11. The inferior vena cava is normal in size with greater than 50% respiratory variability, suggesting right atrial pressure of 3 mmHg. 12. The tricuspid regurgitant velocity is 2.37 m/s, and with an assumed right atrial pressure of 3 mmHg, the estimated right ventricular systolic pressure is normal at 25.5 mmHg. 13. Compared to prior TTE 12/25/17, aortic stenosis has worsened, now in severe range  EKG:  EKG right bundle, leftward axis, normal sinus rhythm.  Recent Labs: 08/06/2018: Zavala 29; Creatinine, Ser 1.84; Potassium 4.9; Sodium 139  Recent Lipid Panel    Component Value Date/Time   CHOL 83 (L) 01/24/2015 0949   TRIG 49 01/24/2015 0949   HDL 38 (L) 01/24/2015 0949   CHOLHDL 2.2 01/24/2015 0949   VLDL 10 01/24/2015 0949   LDLCALC 35 01/24/2015 0949    Physical Exam:    VS:  BP 116/60   Pulse 74   Ht 5' 11" (1.803 m)   Wt 190 lb (86.2 kg)   SpO2 96%   BMI 26.50 kg/m     Wt Readings from Last 3 Encounters:  01/18/19 190 lb (86.2 kg)  08/10/18 189 lb 6 oz (85.9 kg)  02/10/18 192 lb (87.1 kg)     GEN: Compatible with age. No acute distress HEENT: Normal NECK: No JVD. LYMPHATICS: No lymphadenopathy CARDIAC: 3/6 to 4/6 crescendo decrescendo murmur of aortic stenosis.  RRR without murmur, gallop, or edema. VASCULAR:  Normal Pulses. No  bruits. RESPIRATORY:  Clear to auscultation without rales, wheezing or rhonchi  ABDOMEN: Soft, non-tender, non-distended, No pulsatile mass, MUSCULOSKELETAL: No deformity  SKIN: Warm and dry NEUROLOGIC:  Alert and oriented x 3 PSYCHIATRIC:  Normal affect   ASSESSMENT:    1. Aortic valve stenosis, etiology of cardiac valve disease unspecified   2. CAD S/P percutaneous coronary angioplasty   3. Essential hypertension   4. Right bundle branch block   5. Non-insulin treated type 2 diabetes mellitus ([facility])   6. Educated about COVID-19 virus infection    PLAN:    In order of problems listed above:  1. Severe calcific aortic stenosis.  Most recent transvalvular velocity 4.4 m/s.  I have counseled the patient to begin the process of evaluation for valve therapy.  I have recommended left and right heart cath with coronary angiography then referral to the valve clinic.  Hopefully he will be a candidate for TAVR.  He is informed that if there is severe multivessel CAD given diabetes, that he may end up having surgical revascularization plus aortic valve replacement.  This seems unlikely given his current asymptomatic state. 2. Remote PCI during unstable angina 3. Excellent blood pressure control 4. EKG is unchanged 5. Most recent hemoglobin A1c is not available.  Target is less than 7. 6. The 3W's is being practiced and the first dose of the COVID-19 vaccine has been received.  The patient was counseled to undergo left heart catheterization, coronary angiography, and possible percutaneous coronary intervention with stent implantation. The procedural risks and benefits were discussed in detail. The risks discussed included death, stroke, myocardial infarction, life-threatening bleeding, limb ischemia, kidney injury, allergy, and possible emergency cardiac surgery. The risk of these significant complications were estimated to occur less than 1% of the time. After discussion, the patient has agreed to  proceed.    Medication Adjustments/Labs and Tests Ordered: Current medicines are reviewed at length with the patient today.  Concerns regarding medicines are outlined above.  Orders   Placed This Encounter  Procedures  . EKG 12-Lead   No orders of the defined types were placed in this encounter.   There are no Patient Instructions on file for this visit.   Signed, Camryn Quesinberry W Pharrell Ledford III, MD  01/18/2019 8:58 AM    [facility] 

## 2019-01-18 ENCOUNTER — Ambulatory Visit (INDEPENDENT_AMBULATORY_CARE_PROVIDER_SITE_OTHER): Payer: Medicare Other | Admitting: Interventional Cardiology

## 2019-01-18 ENCOUNTER — Other Ambulatory Visit: Payer: Self-pay

## 2019-01-18 ENCOUNTER — Encounter: Payer: Self-pay | Admitting: Interventional Cardiology

## 2019-01-18 VITALS — BP 116/60 | HR 74 | Ht 71.0 in | Wt 190.0 lb

## 2019-01-18 DIAGNOSIS — Z9861 Coronary angioplasty status: Secondary | ICD-10-CM | POA: Diagnosis not present

## 2019-01-18 DIAGNOSIS — N183 Chronic kidney disease, stage 3 unspecified: Secondary | ICD-10-CM | POA: Diagnosis not present

## 2019-01-18 DIAGNOSIS — I4510 Unspecified right bundle-branch block: Secondary | ICD-10-CM | POA: Diagnosis not present

## 2019-01-18 DIAGNOSIS — E119 Type 2 diabetes mellitus without complications: Secondary | ICD-10-CM

## 2019-01-18 DIAGNOSIS — I2510 Atherosclerotic heart disease of native coronary artery without angina pectoris: Secondary | ICD-10-CM | POA: Diagnosis not present

## 2019-01-18 DIAGNOSIS — Z7189 Other specified counseling: Secondary | ICD-10-CM | POA: Diagnosis not present

## 2019-01-18 DIAGNOSIS — I10 Essential (primary) hypertension: Secondary | ICD-10-CM

## 2019-01-18 DIAGNOSIS — I350 Nonrheumatic aortic (valve) stenosis: Secondary | ICD-10-CM | POA: Diagnosis not present

## 2019-01-18 NOTE — Patient Instructions (Addendum)
Medication Instructions:  Your physician recommends that you continue on your current medications as directed. Please refer to the Current Medication list given to you today.  *If you need a refill on your cardiac medications before your next appointment, please call your pharmacy*  Lab Work: You will need a BMET and CBC prior to your heart catheterization.   If you have labs (blood work) drawn today and your tests are completely normal, you will receive your results only by: Marland Kitchen MyChart Message (if you have MyChart) OR . A paper copy in the mail If you have any lab test that is abnormal or we need to change your treatment, we will call you to review the results.  Testing/Procedures: Your physician has requested that you have a cardiac catheterization. Cardiac catheterization is used to diagnose and/or treat various heart conditions. Doctors may recommend this procedure for a number of different reasons. The most common reason is to evaluate chest pain. Chest pain can be a symptom of coronary artery disease (CAD), and cardiac catheterization can show whether plaque is narrowing or blocking your heart's arteries. This procedure is also used to evaluate the valves, as well as measure the blood flow and oxygen levels in different parts of your heart. For further information please visit [email or web address]. Please follow instruction sheet, as given.   Follow-Up: At [facility], you and your health needs are our priority.  As part of our continuing mission to provide you with exceptional heart care, we have created designated Provider Care Teams.  These Care Teams include your primary Cardiologist (physician) and Advanced Practice Providers (APPs -  Physician Assistants and Nurse Practitioners) who all work together to provide you with the care you need, when you need it.  Your next appointment:   3-4 month(s)  The format for your next appointment:   In Person  Provider:   You may see Sinclair Grooms, MD or one of the following Advanced Practice Providers on your designated Care Team:    Truitt Merle, NP  Cecilie Kicks, NP  Kathyrn Drown, NP   Other Instructions     [place] OFFICE [place], [place] [address] Dept: [phone] Loc: [phone]  Hussam Howden  01/18/2019  You are scheduled for a Cardiac Catheterization on Wednesday, January 27 with Dr. Daneen Schick.  1. Please arrive at the [facility] (Main Entrance A) at [facility]: [place], [place] [address] at 6:00 AM (This time is two hours before your procedure to ensure your preparation). Free valet parking service is available.   Special note: Every effort is made to have your procedure done on time. Please understand that emergencies sometimes delay scheduled procedures.  2. Diet: Do not eat solid foods after midnight.  The patient may have clear liquids until 5am upon the day of the procedure.  3. Labs: You will need to have blood drawn on __________________________. You do not need to be fasting.  4. Medication instructions in preparation for your procedure:   Contrast Allergy: No   Do not take Diabetes Med Glucovance (Metformin + Glyburide) on the day of the procedure and HOLD 48 HOURS AFTER THE PROCEDURE.  You will also need to hold your Jardiance, Onglyza and Ramipril the morning of your procedure.   On the morning of your procedure, take your Aspirin and any morning medicines NOT listed above.  You may use sips of  water.  5. Plan for one night stay--bring personal belongings. 6. Bring a current list of your medications and current insurance cards. 7. You MUST have a responsible person to drive you home. 8. Someone MUST be with you the first 24 hours after you arrive home or your discharge will be delayed. 9. Please wear clothes that are easy to get on and off and  wear slip-on shoes.  Thank you for allowing [place] to care for you!   -- [facility]

## 2019-01-19 ENCOUNTER — Ambulatory Visit: Payer: Medicare Other

## 2019-01-26 ENCOUNTER — Other Ambulatory Visit: Payer: Self-pay

## 2019-01-26 ENCOUNTER — Other Ambulatory Visit: Payer: Medicare Other | Admitting: *Deleted

## 2019-01-26 DIAGNOSIS — I350 Nonrheumatic aortic (valve) stenosis: Secondary | ICD-10-CM

## 2019-01-26 DIAGNOSIS — N183 Chronic kidney disease, stage 3 unspecified: Secondary | ICD-10-CM

## 2019-01-26 LAB — CBC
Hematocrit: 37.5 % (ref 37.5–51.0)
Hemoglobin: 12.4 g/dL — ABNORMAL LOW (ref 13.0–17.7)
MCH: 31.2 pg (ref 26.6–33.0)
MCHC: 33.1 g/dL (ref 31.5–35.7)
MCV: 94 fL (ref 79–97)
Platelets: 233 10*3/uL (ref 150–450)
RBC: 3.98 x10E6/uL — ABNORMAL LOW (ref 4.14–5.80)
RDW: 12.6 % (ref 11.6–15.4)
WBC: 17.2 10*3/uL — ABNORMAL HIGH (ref 3.4–10.8)

## 2019-01-26 LAB — BASIC METABOLIC PANEL
BUN/Creatinine Ratio: 15 (ref 10–24)
BUN: 23 mg/dL (ref 8–27)
CO2: 21 mmol/L (ref 20–29)
Calcium: 9.5 mg/dL (ref 8.6–10.2)
Chloride: 104 mmol/L (ref 96–106)
Creatinine, Ser: 1.53 mg/dL — ABNORMAL HIGH (ref 0.76–1.27)
GFR calc Af Amer: 49 mL/min/{1.73_m2} — ABNORMAL LOW (ref >59)
GFR calc non Af Amer: 43 mL/min/{1.73_m2} — ABNORMAL LOW (ref >59)
Glucose: 258 mg/dL — ABNORMAL HIGH (ref 65–99)
Potassium: 4.7 mmol/L (ref 3.5–5.2)
Sodium: 139 mmol/L (ref 134–144)

## 2019-01-29 ENCOUNTER — Other Ambulatory Visit (HOSPITAL_COMMUNITY)
Admission: RE | Admit: 2019-01-29 | Discharge: 2019-01-29 | Disposition: A | Discharge status: Home / Self Care | Payer: Medicare Other | Source: Ambulatory Visit | Attending: Interventional Cardiology | Admitting: Interventional Cardiology

## 2019-01-29 DIAGNOSIS — Z01812 Encounter for preprocedural laboratory examination: Secondary | ICD-10-CM | POA: Diagnosis not present

## 2019-01-29 DIAGNOSIS — Z20822 Contact with and (suspected) exposure to covid-19: Secondary | ICD-10-CM | POA: Diagnosis not present

## 2019-01-29 LAB — SARS CORONAVIRUS 2 (TAT 6-24 HRS): SARS Coronavirus 2: NEGATIVE

## 2019-01-31 ENCOUNTER — Telehealth: Payer: Self-pay | Admitting: Interventional Cardiology

## 2019-01-31 NOTE — Telephone Encounter (Signed)
Patient is calling about his lab results, he states he failed to mention that he received the COVID 19 vaccine on 01/14/19.  He thinks this might be the cause of his elevated WBC.

## 2019-02-01 ENCOUNTER — Telehealth: Payer: Self-pay | Admitting: *Deleted

## 2019-02-01 NOTE — Telephone Encounter (Signed)
Replied to MyChart message.

## 2019-02-01 NOTE — H&P (Signed)
Severe aortic stenosis with no angina or symptoms. Plan Left and right heart cath for AS and coronary angiography.

## 2019-02-01 NOTE — Telephone Encounter (Signed)
Pt contacted pre-catheterization scheduled at [facility] for: Wednesday February 02, 2019 10 AM Verified arrival time and place: [place] [facility]) at: 5:30 AM-pre procedure hydration/CBC   No solid food after midnight prior to cath, clear liquids until 5 AM day of procedure. Contrast allergy: no  Hold: Glucovance-day of procedure and 48 hours post procedure. Jardiance-AM of procedure. AM diabetes medications.    Except hold medications AM meds can be  taken pre-cath with sip of water including: ASA 81 mg   Confirmed patient has responsible adult to drive home post procedure and observe 24 hours after arriving home: yes  Currently, due to Covid-19 pandemic, only one person will be allowed with patient. Must be the same person for patient's entire stay and will be required to wear a mask. They will be asked to wait in the waiting room for the duration of the patient's stay.  Patients are required to wear a mask when they enter the hospital.     COVID-19 Pre-Screening Questions:  . In the past 7 to 10 days have you had a cough,  shortness of breath, headache, congestion, fever (100 or greater) body aches, chills, sore throat, or sudden loss of taste or sense of smell? no   01/26/19-WBC 17.2 -pt denies fever or any signs/symptoms of infection. Per Dr Ardis Rowan to proceed with procedure, repeat CBC at hospital AM of procedure.  I reviewed procedure/mask/visitor instructions with patient, he verbalized understanding, thanked me for call.

## 2019-02-02 ENCOUNTER — Ambulatory Visit (HOSPITAL_COMMUNITY)
Admission: RE | Admit: 2019-02-02 | Discharge: 2019-02-02 | Disposition: A | Discharge status: Home / Self Care | Payer: Medicare Other | Attending: Interventional Cardiology | Admitting: Interventional Cardiology

## 2019-02-02 ENCOUNTER — Encounter (HOSPITAL_COMMUNITY)
Admission: RE | Disposition: A | Discharge status: Home / Self Care | Payer: Self-pay | Source: Home / Self Care | Attending: Interventional Cardiology

## 2019-02-02 ENCOUNTER — Other Ambulatory Visit: Payer: Self-pay

## 2019-02-02 DIAGNOSIS — Z7982 Long term (current) use of aspirin: Secondary | ICD-10-CM | POA: Diagnosis not present

## 2019-02-02 DIAGNOSIS — Z6826 Body mass index (BMI) 26.0-26.9, adult: Secondary | ICD-10-CM | POA: Insufficient documentation

## 2019-02-02 DIAGNOSIS — I350 Nonrheumatic aortic (valve) stenosis: Secondary | ICD-10-CM | POA: Insufficient documentation

## 2019-02-02 DIAGNOSIS — N189 Chronic kidney disease, unspecified: Secondary | ICD-10-CM | POA: Diagnosis not present

## 2019-02-02 DIAGNOSIS — Z8249 Family history of ischemic heart disease and other diseases of the circulatory system: Secondary | ICD-10-CM | POA: Insufficient documentation

## 2019-02-02 DIAGNOSIS — I10 Essential (primary) hypertension: Secondary | ICD-10-CM | POA: Diagnosis present

## 2019-02-02 DIAGNOSIS — Z87891 Personal history of nicotine dependence: Secondary | ICD-10-CM | POA: Insufficient documentation

## 2019-02-02 DIAGNOSIS — K219 Gastro-esophageal reflux disease without esophagitis: Secondary | ICD-10-CM | POA: Diagnosis not present

## 2019-02-02 DIAGNOSIS — I4510 Unspecified right bundle-branch block: Secondary | ICD-10-CM | POA: Diagnosis not present

## 2019-02-02 DIAGNOSIS — Z955 Presence of coronary angioplasty implant and graft: Secondary | ICD-10-CM | POA: Insufficient documentation

## 2019-02-02 DIAGNOSIS — I2510 Atherosclerotic heart disease of native coronary artery without angina pectoris: Secondary | ICD-10-CM | POA: Insufficient documentation

## 2019-02-02 DIAGNOSIS — N183 Chronic kidney disease, stage 3 unspecified: Secondary | ICD-10-CM | POA: Diagnosis present

## 2019-02-02 DIAGNOSIS — I129 Hypertensive chronic kidney disease with stage 1 through stage 4 chronic kidney disease, or unspecified chronic kidney disease: Secondary | ICD-10-CM | POA: Insufficient documentation

## 2019-02-02 DIAGNOSIS — I25119 Atherosclerotic heart disease of native coronary artery with unspecified angina pectoris: Secondary | ICD-10-CM

## 2019-02-02 DIAGNOSIS — K589 Irritable bowel syndrome without diarrhea: Secondary | ICD-10-CM | POA: Diagnosis not present

## 2019-02-02 DIAGNOSIS — E785 Hyperlipidemia, unspecified: Secondary | ICD-10-CM | POA: Insufficient documentation

## 2019-02-02 DIAGNOSIS — E119 Type 2 diabetes mellitus without complications: Secondary | ICD-10-CM

## 2019-02-02 DIAGNOSIS — Z79899 Other long term (current) drug therapy: Secondary | ICD-10-CM | POA: Insufficient documentation

## 2019-02-02 DIAGNOSIS — R011 Cardiac murmur, unspecified: Secondary | ICD-10-CM | POA: Diagnosis present

## 2019-02-02 DIAGNOSIS — E669 Obesity, unspecified: Secondary | ICD-10-CM | POA: Insufficient documentation

## 2019-02-02 DIAGNOSIS — E1122 Type 2 diabetes mellitus with diabetic chronic kidney disease: Secondary | ICD-10-CM | POA: Diagnosis not present

## 2019-02-02 DIAGNOSIS — I252 Old myocardial infarction: Secondary | ICD-10-CM | POA: Insufficient documentation

## 2019-02-02 DIAGNOSIS — M1990 Unspecified osteoarthritis, unspecified site: Secondary | ICD-10-CM | POA: Diagnosis not present

## 2019-02-02 DIAGNOSIS — Z7984 Long term (current) use of oral hypoglycemic drugs: Secondary | ICD-10-CM | POA: Insufficient documentation

## 2019-02-02 HISTORY — PX: RIGHT/LEFT HEART CATH AND CORONARY ANGIOGRAPHY: CATH118266

## 2019-02-02 LAB — POCT I-STAT 7, (LYTES, BLD GAS, ICA,H+H)
Acid-base deficit: 2 mmol/L (ref 0.0–2.0)
Bicarbonate: 24 mmol/L (ref 20.0–28.0)
Calcium, Ion: 1.34 mmol/L (ref 1.15–1.40)
HCT: 36 % — ABNORMAL LOW (ref 39.0–52.0)
Hemoglobin: 12.2 g/dL — ABNORMAL LOW (ref 13.0–17.0)
O2 Saturation: 99 %
Potassium: 4.4 mmol/L (ref 3.5–5.1)
Sodium: 141 mmol/L (ref 135–145)
TCO2: 25 mmol/L (ref 22–32)
pCO2 arterial: 46.8 mmHg (ref 32.0–48.0)
pH, Arterial: 7.319 — ABNORMAL LOW (ref 7.350–7.450)
pO2, Arterial: 136 mmHg — ABNORMAL HIGH (ref 83.0–108.0)

## 2019-02-02 LAB — POCT I-STAT EG7
Acid-base deficit: 2 mmol/L (ref 0.0–2.0)
Acid-base deficit: 2 mmol/L (ref 0.0–2.0)
Bicarbonate: 24.3 mmol/L (ref 20.0–28.0)
Bicarbonate: 24.4 mmol/L (ref 20.0–28.0)
Calcium, Ion: 1.3 mmol/L (ref 1.15–1.40)
Calcium, Ion: 1.34 mmol/L (ref 1.15–1.40)
HCT: 36 % — ABNORMAL LOW (ref 39.0–52.0)
HCT: 36 % — ABNORMAL LOW (ref 39.0–52.0)
Hemoglobin: 12.2 g/dL — ABNORMAL LOW (ref 13.0–17.0)
Hemoglobin: 12.2 g/dL — ABNORMAL LOW (ref 13.0–17.0)
O2 Saturation: 74 %
O2 Saturation: 75 %
Potassium: 4.3 mmol/L (ref 3.5–5.1)
Potassium: 4.4 mmol/L (ref 3.5–5.1)
Sodium: 142 mmol/L (ref 135–145)
Sodium: 142 mmol/L (ref 135–145)
TCO2: 26 mmol/L (ref 22–32)
TCO2: 26 mmol/L (ref 22–32)
pCO2, Ven: 48.2 mmHg (ref 44.0–60.0)
pCO2, Ven: 48.5 mmHg (ref 44.0–60.0)
pH, Ven: 7.31 (ref 7.250–7.430)
pH, Ven: 7.311 (ref 7.250–7.430)
pO2, Ven: 43 mmHg (ref 32.0–45.0)
pO2, Ven: 44 mmHg (ref 32.0–45.0)

## 2019-02-02 LAB — CBC
HCT: 38.1 % — ABNORMAL LOW (ref 39.0–52.0)
Hemoglobin: 12.5 g/dL — ABNORMAL LOW (ref 13.0–17.0)
MCH: 30.9 pg (ref 26.0–34.0)
MCHC: 32.8 g/dL (ref 30.0–36.0)
MCV: 94.1 fL (ref 80.0–100.0)
Platelets: 223 10*3/uL (ref 150–400)
RBC: 4.05 MIL/uL — ABNORMAL LOW (ref 4.22–5.81)
RDW: 13.3 % (ref 11.5–15.5)
WBC: 13.9 10*3/uL — ABNORMAL HIGH (ref 4.0–10.5)
nRBC: 0.1 % (ref 0.0–0.2)

## 2019-02-02 LAB — GLUCOSE, CAPILLARY: Glucose-Capillary: 140 mg/dL — ABNORMAL HIGH (ref 70–99)

## 2019-02-02 SURGERY — RIGHT/LEFT HEART CATH AND CORONARY ANGIOGRAPHY
Anesthesia: LOCAL

## 2019-02-02 MED ORDER — EZETIMIBE-SIMVASTATIN 10-20 MG PO TABS: 1.0000 | ORAL_TABLET | Freq: Every day | ORAL | Status: DC

## 2019-02-02 MED ORDER — VERAPAMIL HCL 2.5 MG/ML IV SOLN
INTRAVENOUS | Status: AC
  Filled 2019-02-02: qty 2

## 2019-02-02 MED ORDER — ONDANSETRON HCL 4 MG/2ML IJ SOLN: 4.0000 mg | Freq: Four times a day (QID) | INTRAMUSCULAR | Status: DC | PRN

## 2019-02-02 MED ORDER — METOPROLOL SUCCINATE ER 25 MG PO TB24: 50.0000 mg | ORAL_TABLET | Freq: Every day | ORAL | Status: DC

## 2019-02-02 MED ORDER — SODIUM CHLORIDE 0.9 % WEIGHT BASED INFUSION
3.0000 mL/kg/h | INTRAVENOUS | Status: DC
  Administered 2019-02-02: 07:00:00 3 mL/kg/h via INTRAVENOUS

## 2019-02-02 MED ORDER — HEPARIN (PORCINE) IN NACL 1000-0.9 UT/500ML-% IV SOLN
INTRAVENOUS | Status: AC
  Filled 2019-02-02: qty 1000

## 2019-02-02 MED ORDER — SODIUM CHLORIDE 0.9 % IV SOLN: INTRAVENOUS | Status: DC

## 2019-02-02 MED ORDER — SODIUM CHLORIDE 0.9 % IV SOLN: 250.0000 mL | INTRAVENOUS | Status: DC | PRN

## 2019-02-02 MED ORDER — OMEGA-3-ACID ETHYL ESTERS 1 G PO CAPS: 1.0000 g | ORAL_CAPSULE | Freq: Three times a day (TID) | ORAL | Status: DC

## 2019-02-02 MED ORDER — ADULT MULTIVITAMIN W/MINERALS CH: 1.0000 | ORAL_TABLET | Freq: Every day | ORAL | Status: DC

## 2019-02-02 MED ORDER — SODIUM CHLORIDE 0.9% FLUSH: 3.0000 mL | INTRAVENOUS | Status: DC | PRN

## 2019-02-02 MED ORDER — SODIUM CHLORIDE 0.9% FLUSH: 3.0000 mL | Freq: Two times a day (BID) | INTRAVENOUS | Status: DC

## 2019-02-02 MED ORDER — VERAPAMIL HCL 2.5 MG/ML IV SOLN
INTRAVENOUS | Status: DC | PRN
  Administered 2019-02-02: 10 mL via INTRA_ARTERIAL

## 2019-02-02 MED ORDER — ACETAMINOPHEN 325 MG PO TABS: 650.0000 mg | ORAL_TABLET | ORAL | Status: DC | PRN

## 2019-02-02 MED ORDER — SODIUM CHLORIDE 0.9 % IV SOLN
INTRAVENOUS | Status: AC | PRN
  Administered 2019-02-02: 150 mL/h via INTRAVENOUS

## 2019-02-02 MED ORDER — ASPIRIN EC 81 MG PO TBEC: 81.0000 mg | DELAYED_RELEASE_TABLET | Freq: Every day | ORAL | Status: DC

## 2019-02-02 MED ORDER — IOHEXOL 350 MG/ML SOLN
INTRAVENOUS | Status: DC | PRN
  Administered 2019-02-02: 75 mL via INTRACARDIAC

## 2019-02-02 MED ORDER — SODIUM CHLORIDE 0.9 % WEIGHT BASED INFUSION: 1.0000 mL/kg/h | INTRAVENOUS | Status: DC

## 2019-02-02 MED ORDER — LINAGLIPTIN 5 MG PO TABS: 5.0000 mg | ORAL_TABLET | Freq: Every day | ORAL | Status: DC

## 2019-02-02 MED ORDER — LIDOCAINE HCL (PF) 1 % IJ SOLN
INTRAMUSCULAR | Status: DC | PRN
  Administered 2019-02-02 (×2): 2 mL

## 2019-02-02 MED ORDER — HYDRALAZINE HCL 20 MG/ML IJ SOLN: 10.0000 mg | INTRAMUSCULAR | Status: DC | PRN

## 2019-02-02 MED ORDER — LABETALOL HCL 5 MG/ML IV SOLN: 10.0000 mg | INTRAVENOUS | Status: DC | PRN

## 2019-02-02 MED ORDER — RAMIPRIL 10 MG PO CAPS: 10.0000 mg | ORAL_CAPSULE | Freq: Every day | ORAL | Status: DC

## 2019-02-02 MED ORDER — FENTANYL CITRATE (PF) 100 MCG/2ML IJ SOLN
INTRAMUSCULAR | Status: AC
  Filled 2019-02-02: qty 2

## 2019-02-02 MED ORDER — PANTOPRAZOLE SODIUM 40 MG PO TBEC: 40.0000 mg | DELAYED_RELEASE_TABLET | Freq: Every day | ORAL | Status: DC

## 2019-02-02 MED ORDER — HEPARIN SODIUM (PORCINE) 1000 UNIT/ML IJ SOLN
INTRAMUSCULAR | Status: AC
  Filled 2019-02-02: qty 1

## 2019-02-02 MED ORDER — MIDAZOLAM HCL 2 MG/2ML IJ SOLN
INTRAMUSCULAR | Status: DC | PRN
  Administered 2019-02-02 (×2): 1 mg via INTRAVENOUS

## 2019-02-02 MED ORDER — ASPIRIN 81 MG PO CHEW: 81.0000 mg | CHEWABLE_TABLET | Freq: Every day | ORAL | Status: DC

## 2019-02-02 MED ORDER — PIOGLITAZONE HCL 45 MG PO TABS: 45.0000 mg | ORAL_TABLET | Freq: Every day | ORAL | Status: DC

## 2019-02-02 MED ORDER — HEPARIN (PORCINE) IN NACL 1000-0.9 UT/500ML-% IV SOLN
INTRAVENOUS | Status: DC | PRN
  Administered 2019-02-02 (×2): 500 mL

## 2019-02-02 MED ORDER — GLYBURIDE-METFORMIN 5-500 MG PO TABS: 2.0000 | ORAL_TABLET | Freq: Two times a day (BID) | ORAL | Status: DC

## 2019-02-02 MED ORDER — ASPIRIN 81 MG PO CHEW
81.0000 mg | CHEWABLE_TABLET | ORAL | Status: AC
  Administered 2019-02-02: 08:00:00 81 mg via ORAL

## 2019-02-02 MED ORDER — LIDOCAINE HCL (PF) 1 % IJ SOLN
INTRAMUSCULAR | Status: AC
  Filled 2019-02-02: qty 30

## 2019-02-02 MED ORDER — HEPARIN SODIUM (PORCINE) 1000 UNIT/ML IJ SOLN
INTRAMUSCULAR | Status: DC | PRN
  Administered 2019-02-02: 4500 [IU] via INTRAVENOUS

## 2019-02-02 MED ORDER — FENTANYL CITRATE (PF) 100 MCG/2ML IJ SOLN
INTRAMUSCULAR | Status: DC | PRN
  Administered 2019-02-02: 25 ug via INTRAVENOUS

## 2019-02-02 MED ORDER — MIDAZOLAM HCL 2 MG/2ML IJ SOLN
INTRAMUSCULAR | Status: AC
  Filled 2019-02-02: qty 2

## 2019-02-02 MED ORDER — VITAMIN D2 50 MCG (2000 UT) PO TABS: 2000.0000 [IU] | ORAL_TABLET | Freq: Every day | ORAL | Status: DC

## 2019-02-02 SURGICAL SUPPLY — 13 items
CATH 5FR JL3.5 JR4 ANG PIG MP (CATHETERS) ×1 IMPLANT
CATH BALLN WEDGE 5F 110CM (CATHETERS) ×1 IMPLANT
DEVICE RAD COMP TR BAND LRG (VASCULAR PRODUCTS) ×1 IMPLANT
GLIDESHEATH SLEND A-KIT 6F 22G (SHEATH) ×1 IMPLANT
GUIDEWIRE .025 260CM (WIRE) ×1 IMPLANT
GUIDEWIRE INQWIRE 1.5J.035X260 (WIRE) IMPLANT
INQWIRE 1.5J .035X260CM (WIRE) ×2
KIT HEART LEFT (KITS) ×2 IMPLANT
PACK CARDIAC CATHETERIZATION (CUSTOM PROCEDURE TRAY) ×2 IMPLANT
SHEATH GLIDE SLENDER 4/5FR (SHEATH) ×1 IMPLANT
TRANSDUCER W/STOPCOCK (MISCELLANEOUS) ×2 IMPLANT
TUBING CIL FLEX 10 FLL-RA (TUBING) ×2 IMPLANT
WIRE EMERALD ST .035X150CM (WIRE) ×1 IMPLANT

## 2019-02-02 NOTE — CV Procedure (Addendum)
   Right and left heart cath via the right antecubital and right radial approach using real-time vascular ultrasound for access.  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  Moderate proximal to mid LAD disease less than 50%.  Patent LAD stent.  Widely patent circumflex  Widely patent left main  Significant aortic stenosis, peak to peak gradient 50 mmHg with mean gradient 30 mmHg and calculated aortic valve area of 1.24 based upon a cardiac output of 6.6 L/min.  Will refer to the heart valve clinic to be considered for transaortic valve replacement (TAVR).  He has an appointment set to see Dr. Angelena Form on February 1.

## 2019-02-02 NOTE — Interval H&P Note (Signed)
Cath Lab Visit (complete for each Cath Lab visit)  Clinical Evaluation Leading to the Procedure:   ACS: No.  Non-ACS:    Anginal Classification: CCS II  Anti-ischemic medical therapy: Minimal Therapy (1 class of medications)  Non-Invasive Test Results: High-risk stress test findings: cardiac mortality >3%/year  Prior CABG: No previous CABG      History and Physical Interval Note:  02/02/2019 11:01 AM  Barry Zavala  has presented today for surgery, with the diagnosis of aortic stenosis.  The various methods of treatment have been discussed with the patient and family. After consideration of risks, benefits and other options for treatment, the patient has consented to  Procedure(s): RIGHT/LEFT HEART CATH AND CORONARY ANGIOGRAPHY (N/A) as a surgical intervention.  The patient's history has been reviewed, patient examined, no change in status, stable for surgery.  I have reviewed the patient's chart and labs.  Questions were answered to the patient's satisfaction.     Barry Zavala

## 2019-02-02 NOTE — Discharge Instructions (Signed)
Radial Site Care  This sheet gives you information about how to care for yourself after your procedure. Your health care provider may also give you more specific instructions. If you have problems or questions, contact your health care provider. What can I expect after the procedure? After the procedure, it is common to have:  Bruising and tenderness at the catheter insertion area. Follow these instructions at home: Medicines  Take over-the-counter and prescription medicines only as told by your health care provider. Insertion site care  Follow instructions from your health care provider about how to take care of your insertion site. Make sure you: ? Wash your hands with soap and water before you change your bandage (dressing). If soap and water are not available, use hand sanitizer. ? Change your dressing as told by your health care provider. ? Leave stitches (sutures), skin glue, or adhesive strips in place. These skin closures may need to stay in place for 2 weeks or longer. If adhesive strip edges start to loosen and curl up, you may trim the loose edges. Do not remove adhesive strips completely unless your health care provider tells you to do that.  Check your insertion site every day for signs of infection. Check for: ? Redness, swelling, or pain. ? Fluid or blood. ? Pus or a bad smell. ? Warmth.  Do not take baths, swim, or use a hot tub until your health care provider approves.  You may shower 24-48 hours after the procedure, or as directed by your health care provider. ? Remove the dressing and gently wash the site with plain soap and water. ? Pat the area dry with a clean towel. ? Do not rub the site. That could cause bleeding.  Do not apply powder or lotion to the site. Activity   For 24 hours after the procedure, or as directed by your health care provider: ? Do not flex or bend the affected arm. ? Do not push or pull heavy objects with the affected arm. ? Do not  drive yourself home from the hospital or clinic. You may drive 24 hours after the procedure unless your health care provider tells you not to. ? Do not operate machinery or power tools.  Do not lift anything that is heavier than 10 lb (4.5 kg), or the limit that you are told, until your health care provider says that it is safe.  Ask your health care provider when it is okay to: ? Return to work or school. ? Resume usual physical activities or sports. ? Resume sexual activity. General instructions  If the catheter site starts to bleed, raise your arm and put firm pressure on the site. If the bleeding does not stop, get help right away. This is a medical emergency.  If you went home on the same day as your procedure, a responsible adult should be with you for the first 24 hours after you arrive home.  Keep all follow-up visits as told by your health care provider. This is important. Contact a health care provider if:  You have a fever.  You have redness, swelling, or yellow drainage around your insertion site. Get help right away if:  You have unusual pain at the radial site.  The catheter insertion area swells very fast.  The insertion area is bleeding, and the bleeding does not stop when you hold steady pressure on the area.  Your arm or hand becomes pale, cool, tingly, or numb. These symptoms may represent a serious problem   that is an emergency. Do not wait to see if the symptoms will go away. Get medical help right away. Call your local emergency services (911 in the [place] Do not drive yourself to the hospital. Summary  After the procedure, it is common to have bruising and tenderness at the site.  Follow instructions from your health care provider about how to take care of your radial site wound. Check the wound every day for signs of infection.  Do not lift anything that is heavier than 10 lb (4.5 kg), or the limit that you are told, until your health care provider says  that it is safe. This information is not intended to replace advice given to you by your health care provider. Make sure you discuss any questions you have with your health care provider. Document Revised: 01/28/2017 Document Reviewed: 01/28/2017 [employer] Patient Education  2020 [employer]  

## 2019-02-06 NOTE — Progress Notes (Signed)
[facility] Consult Note  Chief Complaint  Patient presents with  . New Patient (Initial Visit)   History of Present Illness: 79 yo male with history of anemia, arthritis, CAD, GERD, hyperlipidemia, diabetes mellitus, RBBB and aortic stenosis who is here today as a new consult, referred by Dr. Tamala Julian, for further discussion regarding his aortic stenosis and possible TAVR. He is known to have CAD with MI in 1995 and PCI in 2001. Cardiac cath 02/02/19 with patent LAD stent, moderate LAD stenosis, patent Circumflex stent, moderate distal RCA stenosis. Echo 01/13/19 with LVEF=60-65%. The aortic valve leaflets are thickened and calcified with limited mobility. Mean gradient 43 mmHg, peak gradient 71.2 mmHg, AVA 0.8 cm2, dimensionless index 0.28. He has chronic kidney disease with baseline creatinine of 1.5.   He tells me today that he has been feeling well. He has no chest pain, dyspnea, LE edema, dizziness, near syncope or syncope. He lives with his wife in [place]. He sees his dentist regularly and has no active issues. He is a retired Forensic psychologist.   Primary Care Physician: Josetta Huddle, MD Primary Cardiologist: Daneen Schick Referring Cardiologist: Daneen Schick  Past Medical History:  Diagnosis Date  . Anemia   . Aortic stenosis, moderate   . Arthritis    hands  . Coronary artery disease   . Elevated PSA   . Erectile dysfunction   . GERD (gastroesophageal reflux disease)   . Hyperlipidemia   . Hypertension   . Irritable bowel syndrome   . Obesity   . Onychomycosis   . Palmar fibromatosis   . Right bundle branch block   . Type 2 diabetes mellitus ([place])     Past Surgical History:  Procedure Laterality Date  . APPENDECTOMY    . cardiac stents    . CARPAL TUNNEL RELEASE Left 12/09/2016   Procedure: CARPAL TUNNEL RELEASE;  Surgeon: Daryll Brod, MD;  Location: [facility];  Service: Orthopedics;  Laterality: Left;  . CARPAL TUNNEL WITH CUBITAL TUNNEL Right  01/13/2017   Procedure: RIGHT CARPAL TUNNEL WITH CUBITAL TUNNEL RELEASE;  Surgeon: Daryll Brod, MD;  Location: [place];  Service: Orthopedics;  Laterality: Right;  . RIGHT/LEFT HEART CATH AND CORONARY ANGIOGRAPHY N/A 02/02/2019   Procedure: RIGHT/LEFT HEART CATH AND CORONARY ANGIOGRAPHY;  Surgeon: Belva Crome, MD;  Location: [facility];  Service: Cardiovascular;  Laterality: N/A;  . TRIGGER FINGER RELEASE Left 08/10/2018   Procedure: RELEASE TRIGGER FINGER/A-1 PULLEY THUMB AND LEFT SMALL;  Surgeon: Daryll Brod, MD;  Location: [facility];  Service: Orthopedics;  Laterality: Left;  FAB  . ULNAR NERVE TRANSPOSITION Left 12/09/2016   Procedure: DECOMPRESSION LEFT ULNAR NERVE;  Surgeon: Daryll Brod, MD;  Location: [facility];  Service: Orthopedics;  Laterality: Left;  . ULNAR NERVE TRANSPOSITION Right 01/13/2017   Procedure: DECOMPRESSION ULNAR NERVE;  Surgeon: Daryll Brod, MD;  Location: [place] [facility];  Service: Orthopedics;  Laterality: Right;    Current Outpatient Medications  Medication Sig Dispense Refill  . ACTOS 45 MG tablet Take 45 mg by mouth daily with supper.   1  . aspirin EC 81 MG tablet Take 1 tablet (81 mg total) by mouth daily. 90 tablet 3  . Empagliflozin (JARDIANCE) 10 MG TABS Take 10 mg by mouth daily.     . Ergocalciferol (VITAMIN D2) 2000 units TABS Take 2,000 Units by mouth daily.    . Glucosamine-Chondroitin (GLUCOSAMINE CHONDR COMPLEX PO) Take 1 capsule by mouth  2 (two) times daily.    Marland Kitchen glyBURIDE-metformin (GLUCOVANCE) 5-500 MG per tablet Take 2 tablets by mouth 2 (two) times daily with a meal.    . metoprolol succinate (TOPROL-XL) 50 MG 24 hr tablet Take 50 mg by mouth at bedtime. Take with or immediately following a meal.     . Multiple Vitamin (MULTIVITAMIN WITH MINERALS) TABS tablet Take 1 tablet by mouth daily. Centrum silver    . omega-3 acid ethyl esters (LOVAZA) 1 G capsule Take 1 g by mouth 3 (three)  times daily.    Marland Kitchen omeprazole (PRILOSEC) 20 MG capsule Take 20 mg by mouth 2 (two) times a week. Mondays & Thursdays.    . ONGLYZA 5 MG TABS tablet Take 5 mg by mouth daily with supper.   2  . ramipril (ALTACE) 10 MG capsule Take 10 mg by mouth daily with supper.     Marland Kitchen VYTORIN 10-20 MG tablet TAKE 1 TABLET BY MOUTH DAILY. (Patient taking differently: Take 1 tablet by mouth daily with supper. ) 90 tablet 3   No current facility-administered medications for this visit.    Allergies  Allergen Reactions  . Other Other (See Comments)    Agent: Vit C,e,zn,cu-omega3-lut-zeax Reaction: GI upset Agent: Vit C,e,zn,cu-omega3-lut-zeax Reaction: GI upset    Social History   Socioeconomic History  . Marital status: Married    Spouse name: Not on file  . Number of children: 2  . Years of education: Not on file  . Highest education level: Not on file  Occupational History  . Occupation: Retired Chief Executive Officer  Tobacco Use  . Smoking status: Former Smoker    Packs/day: 1.00    Years: 20.00    Pack years: 20.00    Types: Cigarettes    Quit date: 1988    Years since quitting: 33.1  . Smokeless tobacco: Never Used  Substance and Sexual Activity  . Alcohol use: Yes    Alcohol/week: 0.0 standard drinks    Comment: social  . Drug use: No  . Sexual activity: Not on file  Other Topics Concern  . Not on file  Social History Narrative  . Not on file   Social Determinants of Health   Financial Resource Strain:   . Difficulty of Paying Living Expenses: Not on file  Food Insecurity:   . Worried About Charity fundraiser in the Last Year: Not on file  . Ran Out of Food in the Last Year: Not on file  Transportation Needs:   . Lack of Transportation (Medical): Not on file  . Lack of Transportation (Non-Medical): Not on file  Physical Activity:   . Days of Exercise per Week: Not on file  . Minutes of Exercise per Session: Not on file  Stress:   . Feeling of Stress : Not on file  Social  Connections:   . Frequency of Communication with Friends and Family: Not on file  . Frequency of Social Gatherings with Friends and Family: Not on file  . Attends Religious Services: Not on file  . Active Member of Clubs or Organizations: Not on file  . Attends Archivist Meetings: Not on file  . Marital Status: Not on file  Intimate Partner Violence:   . Fear of Current or Ex-Partner: Not on file  . Emotionally Abused: Not on file  . Physically Abused: Not on file  . Sexually Abused: Not on file    Family History  Problem Relation Age of Onset  . Ovarian cancer  Mother 65  . Heart failure Father   . Cancer Paternal Uncle        [place]posi Sarcoma  . Multiple myeloma Sister 105    Review of Systems:  As stated in the HPI and otherwise negative.   BP (!) 110/58   Pulse 76   Ht _0  (1.778 m)   Wt 185 lb (83.9 kg)   SpO2 96%   BMI 26.54 kg/m   Physical Examination: General: Well developed, well nourished, NAD  HEENT: OP clear, mucus membranes moist  SKIN: warm, dry. No rashes. Neuro: No focal deficits  Musculoskeletal: Muscle strength 5/5 all ext  Psychiatric: Mood and affect normal  Neck: No JVD, no carotid bruits, no thyromegaly, no lymphadenopathy.  Lungs:Clear bilaterally, no wheezes, rhonci, crackles Cardiovascular: Regular rate and rhythm. Loud, harsh, late peaking systolic murmur.  Abdomen:Soft. Bowel sounds present. Non-tender.  Extremities: No lower extremity edema. Pulses are 2 + in the bilateral DP/PT.  EKG:  EKG is not ordered today. The ekg ordered today demonstrates   Echo 01/13/19: 1. The aortic valve is abnormal. Severe calcifications. Aortic valve  regurgitation is not visualized. Severe aortic valve stenosis. Vmax 4.2  m/s, MG 43 mmHg, AVA 0.8 cm^2, DI 0.28  2. Left ventricular ejection fraction, by visual estimation, is 60 to  65%. The left ventricle has normal function. There is mildly increased  left ventricular hypertrophy.  3. The  average left ventricular global longitudinal strain is -20.9 %  4. Left ventricular diastolic parameters are indeterminate.  5. Global right ventricle has normal systolic function.The right  ventricular size is normal.  6. Left atrial size was normal.  7. Right atrial size was normal.  8. The mitral valve is normal in structure. No evidence of mitral valve  regurgitation.  9. The tricuspid valve is normal in structure.  10. The pulmonic valve was not well visualized. Pulmonic valve  regurgitation is trivial.  11. The inferior vena cava is normal in size with greater than 50%  respiratory variability, suggesting right atrial pressure of 3 mmHg.  12. The tricuspid regurgitant velocity is 2.37 m/s, and with an assumed  right atrial pressure of 3 mmHg, the estimated right ventricular systolic  pressure is normal at 25.5 mmHg.  63. Compared to prior TTE 12/25/17, aortic stenosis has worsened, now in  severe range   FINDINGS  Left Ventricle: Left ventricular ejection fraction, by visual estimation,  is 60 to 65%. The left ventricle has normal function. The average left  ventricular global longitudinal strain is -20.9 %. The left ventricle has  no regional wall motion  abnormalities. The left ventricular internal cavity size was the left  ventricle is normal in size. There is mildly increased left ventricular  hypertrophy. Left ventricular diastolic parameters are indeterminate.   Right Ventricle: The right ventricular size is normal. No increase in  right ventricular wall thickness. Global RV systolic function is has  normal systolic function. The tricuspid regurgitant velocity is 2.37 m/s,  and with an assumed right atrial pressure  of 3 mmHg, the estimated right ventricular systolic pressure is normal at  25.5 mmHg.   Left Atrium: Left atrial size was normal in size.   Right Atrium: Right atrial size was normal in size   Pericardium: There is no evidence of pericardial  effusion.   Mitral Valve: The mitral valve is normal in structure. No evidence of  mitral valve regurgitation.   Tricuspid Valve: The tricuspid valve is normal in structure. Tricuspid  valve regurgitation is trivial.   Aortic Valve: The aortic valve is abnormal. . There is severe thickening  and severe calcifcation of the aortic valve. Aortic valve regurgitation is  not visualized. Severe aortic stenosis is present. There is severe  thickening of the aortic valve. There  is severe calcifcation of the aortic valve. Aortic valve mean gradient  measures 43.0 mmHg. Aortic valve peak gradient measures 71.2 mmHg. Aortic  valve area, by VTI measures 0.80 cm.   Pulmonic Valve: The pulmonic valve was not well visualized. Pulmonic valve  regurgitation is trivial. Pulmonic regurgitation is trivial.   Aorta: The aortic root and ascending aorta are structurally normal, with  no evidence of dilitation.   Venous: The inferior vena cava is normal in size with greater than 50%  respiratory variability, suggesting right atrial pressure of 3 mmHg.   IAS/Shunts: The interatrial septum was not well visualized.     LEFT VENTRICLE  PLAX 2D  LVIDd:     3.56 cm Diastology  LVIDs:     2.20 cm LV e' lateral:  5.55 cm/s  LV PW:     1.18 cm LV E/e' lateral: 16.1  LV IVS:    1.16 cm LV e' medial:  4.57 cm/s  LVOT diam:   1.90 cm LV E/e' medial: 19.5  LV SV:     37 ml  LV SV Index:  17.63  2D Longitudinal Strain  LVOT Area:   2.84 cm 2D Strain GLS Avg:   -20.9 %     RIGHT VENTRICLE  RV Basal diam: 3.34 cm  RV S prime:   14.30 cm/s  TAPSE (M-mode): 1.8 cm  RVSP:      25.5 mmHg   LEFT ATRIUM       Index    RIGHT ATRIUM      Index  LA diam:    4.40 cm 2.14 cm/m RA Pressure: 3.00 mmHg  LA Vol (A2C):  69.2 ml 33.59 ml/m RA Area:   17.90 cm  LA Vol (A4C):  54.9 ml 26.65 ml/m RA Volume:  50.50 ml 24.51 ml/m  LA Biplane Vol:  62.6 ml 30.39 ml/m  AORTIC VALVE  AV Area (Vmax):  0.67 cm  AV Area (Vmean):  0.65 cm  AV Area (VTI):   0.80 cm  AV Vmax:      422.00 cm/s  AV Vmean:     311.000 cm/s  AV VTI:      1.040 m  AV Peak Grad:   71.2 mmHg  AV Mean Grad:   43.0 mmHg  LVOT Vmax:     99.25 cm/s  LVOT Vmean:    71.000 cm/s  LVOT VTI:     0.292 m  LVOT/AV VTI ratio: 0.28    AORTA  Ao Root diam: 3.70 cm   MITRAL VALVE             TRICUSPID VALVE  MV Area (PHT):            TR Peak grad:  22.5 mmHg  MV PHT:               TR Vmax:    237.00 cm/s  MV Decel Time: 257 msec       Estimated RAP: 3.00 mmHg  MV E velocity: 89.20 cm/s 103 cm/s RVSP:      25.5 mmHg  MV A velocity: 100.00 cm/s 70.3 cm/s  MV E/A ratio: 0.89    1.5    SHUNTS  Systemic VTI: 0.29 m                    Systemic Diam: 1.90 cm  Cardiac cath 02/02/19:  Significant calcific aortic stenosis that by Cath Lab calculations suggest moderate hemodynamic impairment.  Peak to peak gradient approximately 50 mmHg with a calculated valve area of 1.28 cm.  Echo data suggests a transvalvular velocity greater than 4 m/s and demonstrated a lower calculated aortic valve area.  Left main is widely patent  LAD contains a mid first generation nondrug-eluting stent.  The stent is widely patent.  There is moderate diffuse disease in the ostial to mid LAD less than 40%.  Circumflex artery contains a proximal stent that extends into the large first obtuse marginal and is widely patent.  The jailed continuation of the circumflex is widely patent giving origin to 2 small obtuse marginals.  RCA contains diffuse luminal irregularities with distal 50 to 70% narrowing.  Not intervened upon as the patient is totally asymptomatic with reference to angina complaints.  Normal right heart pressures.  Recent  Labs: 01/26/2019: BUN 23; Creatinine, Ser 1.53 02/02/2019: Hemoglobin 12.2; Platelets 223; Potassium 4.4; Sodium 141   Lipid Panel    Component Value Date/Time   CHOL 83 (L) 01/24/2015 0949   TRIG 49 01/24/2015 0949   HDL 38 (L) 01/24/2015 0949   CHOLHDL 2.2 01/24/2015 0949   VLDL 10 01/24/2015 0949   LDLCALC 35 01/24/2015 0949     Wt Readings from Last 3 Encounters:  02/07/19 185 lb (83.9 kg)  02/02/19 183 lb (83 kg)  01/18/19 190 lb (86.2 kg)     Other studies Reviewed: Additional studies/ records that were reviewed today include: echo images, cath images. Review of the above records demonstrates: severe AS   Assessment and Plan:   1. Severe Aortic Valve Stenosis: He has severe aortic stenosis but at this time he is asymptomatic. I have personally reviewed the echo images. The aortic valve is thickened, calcified with limited leaflet mobility. I think he would benefit from AVR when he becomes symptomatic. Given advanced age, he is not a good candidate for conventional AVR by surgical approach. I think he may be a good candidate for TAVR.   I have reviewed the natural history of aortic stenosis with the patient and their family members  who are present today. We have discussed the limitations of medical therapy and the poor prognosis associated with symptomatic aortic stenosis. We have reviewed potential treatment options, including palliative medical therapy, conventional surgical aortic valve replacement, and transcatheter aortic valve replacement. We discussed treatment options in the context of the patient's specific comorbid medical conditions.   I will plan to see him back in 6 months with a repeat echo. He will call with any onset of symptoms suggestive of progression of his aortic stenosis.    Current medicines are reviewed at length with the patient today.  The patient does not have concerns regarding medicines.  The following changes have been made:  no change  Labs/  tests ordered today include:   Orders Placed This Encounter  Procedures  . ECHOCARDIOGRAM COMPLETE     Disposition:   F/U with me  In 6 months with an echo.    Signed, Lauree Chandler, MD 02/07/2019 1:48 PM    [facility] [address], [place], [place]  [address] Phone: [phone]; Fax: [phone]

## 2019-02-07 ENCOUNTER — Encounter: Payer: Self-pay | Admitting: Cardiovascular Disease

## 2019-02-07 ENCOUNTER — Ambulatory Visit (INDEPENDENT_AMBULATORY_CARE_PROVIDER_SITE_OTHER): Payer: Medicare Other | Admitting: Cardiovascular Disease

## 2019-02-07 ENCOUNTER — Other Ambulatory Visit: Payer: Self-pay

## 2019-02-07 VITALS — BP 110/58 | HR 76 | Ht 70.0 in | Wt 185.0 lb

## 2019-02-07 DIAGNOSIS — I350 Nonrheumatic aortic (valve) stenosis: Secondary | ICD-10-CM | POA: Diagnosis not present

## 2019-02-07 DIAGNOSIS — I2510 Atherosclerotic heart disease of native coronary artery without angina pectoris: Secondary | ICD-10-CM | POA: Diagnosis not present

## 2019-02-07 DIAGNOSIS — Z9861 Coronary angioplasty status: Secondary | ICD-10-CM

## 2019-02-07 NOTE — Patient Instructions (Addendum)
Medication Instructions:  Your physician recommends that you continue on your current medications as directed. Please refer to the Current Medication list given to you today.  *If you need a refill on your cardiac medications before your next appointment, please call your pharmacy*  Lab Work: None Ordered If you have labs (blood work) drawn today and your tests are completely normal, you will receive your results only by: Marland Kitchen MyChart Message (if you have MyChart) OR . A paper copy in the mail If you have any lab test that is abnormal or we need to change your treatment, we will call you to review the results.   Testing/Procedures: Your physician has requested that you have an echocardiogram in 6 months on July 27. Echocardiography is a painless test that uses sound waves to create images of your heart. It provides your doctor with information about the size and shape of your heart and how well your heart's chambers and valves are working. This procedure takes approximately one hour. There are no restrictions for this procedure.     Follow-Up: At [facility], you and your health needs are our priority.  As part of our continuing mission to provide you with exceptional heart care, we have created designated Provider Care Teams.  These Care Teams include your primary Cardiologist (physician) and Advanced Practice Providers (APPs -  Physician Assistants and Nurse Practitioners) who all work together to provide you with the care you need, when you need it.  Your next appointment:   6 month(s) for TAVR   The format for your next appointment:   Either In Person or Virtual  Provider:   Lauree Chandler, MD

## 2019-02-07 NOTE — Progress Notes (Signed)
HEMATOLOGY/ONCOLOGY CONSULTATION NOTE  Date of Service: 02/08/2019  Patient Care Team: Josetta Huddle, MD as PCP - General (Internal Medicine) Belva Crome, MD as PCP - Cardiology (Cardiology)  CHIEF COMPLAINTS/PURPOSE OF CONSULTATION:  Leukocytosis/Lymphocytosis  HISTORY OF PRESENTING ILLNESS:   Barry Zavala is a wonderful 79 y.o. male who has been referred to [place] by Dr Josetta Huddle for evaluation and management of leukocytosis (possible lymphoma). We are joined by his wife, Barry Zavala, via phone. The pt reports that he is doing well overall.  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 He went to see his Dermatologist, in the summer, who prescribed 5% Betamethasone Dipropionate. He has been having to use this cream over a large portion of his body. His rash has improved but has not gone away.    He received his first dose of the COVID19 vaccine on 01/14/2019. His WBC was around 17K at this time. He saw his Cardiologist, Dr. Tamala Julian, on 02/02/2019 and found that his WBC had decreased to 13K. He received the second dose of the COVID19 vaccine on 02/04/2019.   He has not felt any different in the last 3-6 months. His other chronic conditions are currently being well managed. Pt golfs 4-5 days per week if the weather allows. He has been on Jardiance for some time and has not needed to be on steroids for any reason within the last 6 months. Pt was a previous smoker and quit in 1988. He does not drink any alcohol. Pt is a retired Engineer, technical sales. Pt has had some previous dental issues that were corrected by a Periodontist. He denies any current issues with his gums. Pt has never needed a blood transfusion and has not had any tattoos. He has benign prostatitis and there have been recent elevations in PSA levels. His sister passed from Multiple Myeloma but he is not aware of any other blood disorder or cancers.   Most recent lab results (02/02/2019) of CBC and BMP is as follows: all  values are WNL except for WBC at 13.9K, RBC at 4.05, Hgb at 12.5, HCT at 38.1  On review of systems, pt denies fevers, chills, night sweats, unexpected weight loss, abdominal pain, chest pain, SOB, new lumps/bumps, sore throat, rhinorrhea, dysuria, hematuria, difficulty starting urine stream, forcing urine stream and any other symptoms.   On PMHx the pt reports Benign Prostatitis, CAD, Elevated PSA, GERD, HLD, HTN, Type 2 Diabetes. On Social Hx the pt reports that he is a previous smoker (quit in 1988), does not drink any alcohol. He is a retired Chief Executive Officer. On Family Hx the pt reports that he had a sister who passed from Multiple Myeloma  MEDICAL HISTORY:  Past Medical History:  Diagnosis Date  . Anemia   . Aortic stenosis, moderate   . Arthritis    hands  . Coronary artery disease   . Elevated PSA   . Erectile dysfunction   . GERD (gastroesophageal reflux disease)   . Hyperlipidemia   . Hypertension   . Irritable bowel syndrome   . Obesity   . Onychomycosis   . Palmar fibromatosis   . Right bundle branch block   . Type 2 diabetes mellitus ([place])     SURGICAL HISTORY: Past Surgical History:  Procedure Laterality Date  . APPENDECTOMY    . cardiac stents    . CARPAL TUNNEL RELEASE Left 12/09/2016   Procedure: CARPAL TUNNEL RELEASE;  Surgeon: Daryll Brod, MD;  Location: [facility];  Service: Orthopedics;  Laterality: Left;  . CARPAL TUNNEL WITH CUBITAL TUNNEL Right 01/13/2017   Procedure: RIGHT CARPAL TUNNEL WITH CUBITAL TUNNEL RELEASE;  Surgeon: Daryll Brod, MD;  Location: [facility];  Service: Orthopedics;  Laterality: Right;  . RIGHT/LEFT HEART CATH AND CORONARY ANGIOGRAPHY N/A 02/02/2019   Procedure: RIGHT/LEFT HEART CATH AND CORONARY ANGIOGRAPHY;  Surgeon: Belva Crome, MD;  Location: [facility];  Service: Cardiovascular;  Laterality: N/A;  . TRIGGER FINGER RELEASE Left 08/10/2018   Procedure: RELEASE TRIGGER FINGER/A-1 PULLEY THUMB AND LEFT  SMALL;  Surgeon: Daryll Brod, MD;  Location: [facility];  Service: Orthopedics;  Laterality: Left;  FAB  . ULNAR NERVE TRANSPOSITION Left 12/09/2016   Procedure: DECOMPRESSION LEFT ULNAR NERVE;  Surgeon: Daryll Brod, MD;  Location: [facility];  Service: Orthopedics;  Laterality: Left;  . ULNAR NERVE TRANSPOSITION Right 01/13/2017   Procedure: DECOMPRESSION ULNAR NERVE;  Surgeon: Daryll Brod, MD;  Location: [facility];  Service: Orthopedics;  Laterality: Right;    SOCIAL HISTORY: Social History   Socioeconomic History  . Marital status: Married    Spouse name: Not on file  . Number of children: 2  . Years of education: Not on file  . Highest education level: Not on file  Occupational History  . Occupation: Retired Chief Executive Officer  Tobacco Use  . Smoking status: Former Smoker    Packs/day: 1.00    Years: 20.00    Pack years: 20.00    Types: Cigarettes    Quit date: 1988    Years since quitting: 33.1  . Smokeless tobacco: Never Used  Substance and Sexual Activity  . Alcohol use: Yes    Alcohol/week: 0.0 standard drinks    Comment: social  . Drug use: No  . Sexual activity: Not on file  Other Topics Concern  . Not on file  Social History Narrative  . Not on file   Social Determinants of Health   Financial Resource Strain:   . Difficulty of Paying Living Expenses: Not on file  Food Insecurity:   . Worried About Charity fundraiser in the Last Year: Not on file  . Ran Out of Food in the Last Year: Not on file  Transportation Needs:   . Lack of Transportation (Medical): Not on file  . Lack of Transportation (Non-Medical): Not on file  Physical Activity:   . Days of Exercise per Week: Not on file  . Minutes of Exercise per Session: Not on file  Stress:   . Feeling of Stress : Not on file  Social Connections:   . Frequency of Communication with Friends and Family: Not on file  . Frequency of Social Gatherings with Friends and Family: Not  on file  . Attends Religious Services: Not on file  . Active Member of Clubs or Organizations: Not on file  . Attends Archivist Meetings: Not on file  . Marital Status: Not on file  Intimate Partner Violence:   . Fear of Current or Ex-Partner: Not on file  . Emotionally Abused: Not on file  . Physically Abused: Not on file  . Sexually Abused: Not on file    FAMILY HISTORY: Family History  Problem Relation Age of Onset  . Ovarian cancer Mother 85  . Heart failure Father   . Cancer Paternal Uncle        [place]posi Sarcoma  . Multiple myeloma Sister 19    ALLERGIES:  is allergic to other.  MEDICATIONS:  Current Outpatient Medications  Medication Sig Dispense Refill  . ACTOS 45 MG tablet Take 45 mg by mouth daily with supper.   1  . aspirin EC 81 MG tablet Take 1 tablet (81 mg total) by mouth daily. 90 tablet 3  . Empagliflozin (JARDIANCE) 10 MG TABS Take 10 mg by mouth daily.     . Ergocalciferol (VITAMIN D2) 2000 units TABS Take 2,000 Units by mouth daily.    . Glucosamine-Chondroitin (GLUCOSAMINE CHONDR COMPLEX PO) Take 1 capsule by mouth 2 (two) times daily.    Marland Kitchen glyBURIDE-metformin (GLUCOVANCE) 5-500 MG per tablet Take 2 tablets by mouth 2 (two) times daily with a meal.    . metoprolol succinate (TOPROL-XL) 50 MG 24 hr tablet Take 50 mg by mouth at bedtime. Take with or immediately following a meal.     . Multiple Vitamin (MULTIVITAMIN WITH MINERALS) TABS tablet Take 1 tablet by mouth daily. Centrum silver    . omega-3 acid ethyl esters (LOVAZA) 1 G capsule Take 1 g by mouth 3 (three) times daily.    Marland Kitchen omeprazole (PRILOSEC) 20 MG capsule Take 20 mg by mouth 2 (two) times a week. Mondays & Thursdays.    . ONGLYZA 5 MG TABS tablet Take 5 mg by mouth daily with supper.   2  . ramipril (ALTACE) 10 MG capsule Take 10 mg by mouth daily with supper.     Marland Kitchen VYTORIN 10-20 MG tablet TAKE 1 TABLET BY MOUTH DAILY. (Patient taking differently: Take 1 tablet by mouth daily with  supper. ) 90 tablet 3   No current facility-administered medications for this visit.    REVIEW OF SYSTEMS:    10 Point review of Systems was done is negative except as noted above.  PHYSICAL EXAMINATION: ECOG PERFORMANCE STATUS: 1 - Symptomatic but completely ambulatory  . Vitals:   02/08/19 1341  BP: (!) 136/95  Pulse: 83  Resp: 18  Temp: 98 F (36.7 C)  SpO2: 97%   Filed Weights   02/08/19 1341  Weight: 191 lb 9.6 oz (86.9 kg)   .Body mass index is 27.49 kg/m.   GENERAL:alert, in no acute distress and comfortable SKIN: no acute rashes, no significant lesions EYES: conjunctiva are pink and non-injected, sclera anicteric OROPHARYNX: MMM, no exudates, no oropharyngeal erythema or ulceration NECK: supple, no JVD LYMPH:  no palpable lymphadenopathy in the cervical, axillary or inguinal regions LUNGS: clear to auscultation b/l with normal respiratory effort HEART: regular rate, 4/6 ejection systolic murmur over the aortic area ABDOMEN:  normoactive bowel sounds, non tender, not distended. Extremity: no pedal edema PSYCH: alert & oriented x 3 with fluent speech NEURO: no focal motor/sensory deficits  LABORATORY DATA:  I have reviewed the data as listed  . CBC Latest Ref Rng & Units 02/08/2019 02/02/2019 02/02/2019  WBC 4.0 - 10.5 K/uL 14.5(H) - -  Hemoglobin 13.0 - 17.0 g/dL 13.0 12.2(L) 12.2(L)  Hematocrit 39.0 - 52.0 % 39.3 36.0(L) 36.0(L)  Platelets 150 - 400 K/uL 223 - -    . CMP Latest Ref Rng & Units 02/08/2019 02/02/2019 02/02/2019  Glucose 70 - 99 mg/dL 196(H) - -  BUN 8 - 23 mg/dL 29(H) - -  Creatinine 0.61 - 1.24 mg/dL 1.67(H) - -  Sodium 135 - 145 mmol/L 140 141 142  Potassium 3.5 - 5.1 mmol/L 4.6 4.4 4.4  Chloride 98 - 111 mmol/L 107 - -  CO2 22 - 32 mmol/L 24 - -  Calcium 8.9 - 10.3 mg/dL  9.7 - -  Total Protein 6.5 - 8.1 g/dL 7.5 - -  Total Bilirubin 0.3 - 1.2 mg/dL 0.3 - -  Alkaline Phos 38 - 126 U/L 62 - -  AST 15 - 41 U/L 18 - -  ALT 0 - 44 U/L 18  - -    WBC  Lymphs Abs   12/05/2015 10.1K 3.5K  12/10/2017 12.0K 7.0K  12/15/2018 14.9K 8.0K   RADIOGRAPHIC STUDIES: I have personally reviewed the radiological images as listed and agreed with the findings in the report. CARDIAC CATHETERIZATION  Result Date: 02/02/2019  Significant calcific aortic stenosis that by Cath Lab calculations suggest moderate hemodynamic impairment.  Peak to peak gradient approximately 50 mmHg with a calculated valve area of 1.28 cm.  Echo data suggests a transvalvular velocity greater than 4 m/s and demonstrated a lower calculated aortic valve area.  Left main is widely patent  LAD contains a mid first generation nondrug-eluting stent.  The stent is widely patent.  There is moderate diffuse disease in the ostial to mid LAD less than 40%.  Circumflex artery contains a proximal stent that extends into the large first obtuse marginal and is widely patent.  The jailed continuation of the circumflex is widely patent giving origin to 2 small obtuse marginals.  RCA contains diffuse luminal irregularities with distal 50 to 70% narrowing.  Not intervened upon as the patient is totally asymptomatic with reference to angina complaints.  Normal right heart pressures. RECOMMENDATIONS:  To see Dr. Angelena Form on February 1 in the valve clinic to consider transaortic valve replacement.  Continue current therapy.  Watchful waiting relative to coronary atherosclerosis.  Aggressive preventive therapy and risk factor modification --> optimal medical therapy.  ECHOCARDIOGRAM COMPLETE  Result Date: 01/13/2019   ECHOCARDIOGRAM REPORT   Patient Name:   Barry Zavala Date of Exam: 01/13/2019 Medical Rec #:  [id number]        Height:       71.0 in Accession #:    [id number]       Weight:       189.4 lb Date of Birth:  [birth year]        BSA:          2.06 m Patient Age:    35 years         BP:           126/70 mmHg Patient Gender: M                HR:           72 bpm. Exam Location:  [facility] Procedure: 2D Echo, Cardiac Doppler and Color Doppler Indications:    I35.0 Aortic Stenosis  History:        Patient has prior history of Echocardiogram examinations, most                 recent 01/04/2018. CAD, CKD, Arrythmias:RBBB; Risk                 Factors:Diabetes and Hypertension.  Sonographer:    Marygrace Drought RCS Referring Phys: [place]  1. The aortic valve is abnormal. Severe calcifications. Aortic valve regurgitation is not visualized. Severe aortic valve stenosis. Vmax 4.2 m/s, MG 43 mmHg, AVA 0.8 cm^2, DI 0.28  2. Left ventricular ejection fraction, by visual estimation, is 60 to 65%. The left ventricle has normal function. There is mildly increased left ventricular hypertrophy.  3. The average left ventricular global longitudinal strain  is -20.9 %  4. Left ventricular diastolic parameters are indeterminate.  5. Global right ventricle has normal systolic function.The right ventricular size is normal.  6. Left atrial size was normal.  7. Right atrial size was normal.  8. The mitral valve is normal in structure. No evidence of mitral valve regurgitation.  9. The tricuspid valve is normal in structure. 10. The pulmonic valve was not well visualized. Pulmonic valve regurgitation is trivial. 11. The inferior vena cava is normal in size with greater than 50% respiratory variability, suggesting right atrial pressure of 3 mmHg. 12. The tricuspid regurgitant velocity is 2.37 m/s, and with an assumed right atrial pressure of 3 mmHg, the estimated right ventricular systolic pressure is normal at 25.5 mmHg. 72. Compared to prior TTE 12/25/17, aortic stenosis has worsened, now in severe range FINDINGS  Left Ventricle: Left ventricular ejection fraction, by visual estimation, is 60 to 65%. The left ventricle has normal function. The average left ventricular global longitudinal strain is -20.9 %. The left ventricle has no regional wall motion abnormalities. The left ventricular internal  cavity size was the left ventricle is normal in size. There is mildly increased left ventricular hypertrophy. Left ventricular diastolic parameters are indeterminate. Right Ventricle: The right ventricular size is normal. No increase in right ventricular wall thickness. Global RV systolic function is has normal systolic function. The tricuspid regurgitant velocity is 2.37 m/s, and with an assumed right atrial pressure  of 3 mmHg, the estimated right ventricular systolic pressure is normal at 25.5 mmHg. Left Atrium: Left atrial size was normal in size. Right Atrium: Right atrial size was normal in size Pericardium: There is no evidence of pericardial effusion. Mitral Valve: The mitral valve is normal in structure. No evidence of mitral valve regurgitation. Tricuspid Valve: The tricuspid valve is normal in structure. Tricuspid valve regurgitation is trivial. Aortic Valve: The aortic valve is abnormal. . There is severe thickening and severe calcifcation of the aortic valve. Aortic valve regurgitation is not visualized. Severe aortic stenosis is present. There is severe thickening of the aortic valve. There is severe calcifcation of the aortic valve. Aortic valve mean gradient measures 43.0 mmHg. Aortic valve peak gradient measures 71.2 mmHg. Aortic valve area, by VTI measures 0.80 cm. Pulmonic Valve: The pulmonic valve was not well visualized. Pulmonic valve regurgitation is trivial. Pulmonic regurgitation is trivial. Aorta: The aortic root and ascending aorta are structurally normal, with no evidence of dilitation. Venous: The inferior vena cava is normal in size with greater than 50% respiratory variability, suggesting right atrial pressure of 3 mmHg. IAS/Shunts: The interatrial septum was not well visualized.  LEFT VENTRICLE PLAX 2D LVIDd:         3.56 cm  Diastology LVIDs:         2.20 cm  LV e' lateral:   5.55 cm/s LV PW:         1.18 cm  LV E/e' lateral: 16.1 LV IVS:        1.16 cm  LV e' medial:    4.57 cm/s  LVOT diam:     1.90 cm  LV E/e' medial:  19.5 LV SV:         37 ml LV SV Index:   17.63    2D Longitudinal Strain LVOT Area:     2.84 cm 2D Strain GLS Avg:     -20.9 %  RIGHT VENTRICLE RV Basal diam:  3.34 cm RV S prime:     14.30 cm/s TAPSE (M-mode): 1.8  cm RVSP:           25.5 mmHg LEFT ATRIUM             Index       RIGHT ATRIUM           Index LA diam:        4.40 cm 2.14 cm/m  RA Pressure: 3.00 mmHg LA Vol (A2C):   69.2 ml 33.59 ml/m RA Area:     17.90 cm LA Vol (A4C):   54.9 ml 26.65 ml/m RA Volume:   50.50 ml  24.51 ml/m LA Biplane Vol: 62.6 ml 30.39 ml/m  AORTIC VALVE AV Area (Vmax):    0.67 cm AV Area (Vmean):   0.65 cm AV Area (VTI):     0.80 cm AV Vmax:           422.00 cm/s AV Vmean:          311.000 cm/s AV VTI:            1.040 m AV Peak Grad:      71.2 mmHg AV Mean Grad:      43.0 mmHg LVOT Vmax:         99.25 cm/s LVOT Vmean:        71.000 cm/s LVOT VTI:          0.292 m LVOT/AV VTI ratio: 0.28  AORTA Ao Root diam: 3.70 cm MITRAL VALVE                         TRICUSPID VALVE MV Area (PHT):                       TR Peak grad:   22.5 mmHg MV PHT:                              TR Vmax:        237.00 cm/s MV Decel Time: 257 msec              Estimated RAP:  3.00 mmHg MV E velocity: 89.20 cm/s  103 cm/s  RVSP:           25.5 mmHg MV A velocity: 100.00 cm/s 70.3 cm/s MV E/A ratio:  0.89        1.5       SHUNTS                                      Systemic VTI:  0.29 m                                      Systemic Diam: 1.90 cm  Oswaldo Milian MD Electronically signed by Oswaldo Milian MD Signature Date/Time: 01/13/2019/1:40:02 PM    Final     ASSESSMENT & PLAN:   79 yo with   1) 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 PLAN: -Discussed patient's most recent labs from 02/02/2019, all values are WNL except for WBC at 13.9K, RBC at 4.05, Hgb at 12.5, HCT at 38.1 -No constitutional symptoms reported -No lymphadenopathy, splenomegaly, hepatomegaly observed on  exam -Advised pt that leukocytosis/lymphocytosis can be caused by a reactive process or a clonal process  -Previous WBC and Lymphs  Abs 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 Orders Placed This Encounter  Procedures  . CBC with Differential/Platelet    Standing Status:   Future    Number of Occurrences:   1    Standing Expiration Date:   03/14/2020  . CMP ([facility] only)    Standing Status:   Future    Number of Occurrences:   1    Standing Expiration Date:   02/08/2020  . Lactate dehydrogenase    Standing Status:   Future    Number of Occurrences:   1    Standing Expiration Date:   02/08/2020  . Flow Cytometry    Lymphocytosis concerning for CLL    Standing Status:   Future    Number of Occurrences:   1    Standing Expiration Date:   02/08/2020  . FISH, CLL Prognostic Panel    Standing Status:   Future    Number of Occurrences:   1    Standing Expiration Date:   02/08/2020  . Hepatitis C antibody    Standing Status:   Future    Number of Occurrences:   1    Standing Expiration Date:   02/08/2020     All of the patients questions were answered with apparent satisfaction. The patient knows to call the clinic with any problems, questions or concerns.  I spent 35 mins counseling the patient face to face. The total time spent in the appointment was 45 minutes and more than 50% was on counseling and direct patient cares.    Sullivan Lone MD [place] AAHIVMS [facility] Hematology/Oncology Physician [facility]  (Office):       [phone] (Work cell):  [phone] (Fax):           [phone]  02/08/2019 4:08 PM  I, Yevette Edwards, am acting as a scribe for Dr. Sullivan Lone.   .I have reviewed the  above documentation for accuracy and completeness, and I agree with the above. Brunetta Genera MD

## 2019-02-08 ENCOUNTER — Inpatient Hospital Stay: Payer: Medicare Other | Attending: Hematology | Admitting: Hematology

## 2019-02-08 ENCOUNTER — Inpatient Hospital Stay: Payer: Medicare Other

## 2019-02-08 ENCOUNTER — Other Ambulatory Visit: Payer: Self-pay

## 2019-02-08 VITALS — BP 136/95 | HR 83 | Temp 98.0°F | Resp 18 | Ht 70.0 in | Wt 191.6 lb

## 2019-02-08 DIAGNOSIS — Z9861 Coronary angioplasty status: Secondary | ICD-10-CM | POA: Diagnosis not present

## 2019-02-08 DIAGNOSIS — R972 Elevated prostate specific antigen [PSA]: Secondary | ICD-10-CM | POA: Diagnosis not present

## 2019-02-08 DIAGNOSIS — I2510 Atherosclerotic heart disease of native coronary artery without angina pectoris: Secondary | ICD-10-CM | POA: Diagnosis not present

## 2019-02-08 DIAGNOSIS — Z7982 Long term (current) use of aspirin: Secondary | ICD-10-CM | POA: Insufficient documentation

## 2019-02-08 DIAGNOSIS — E785 Hyperlipidemia, unspecified: Secondary | ICD-10-CM | POA: Diagnosis not present

## 2019-02-08 DIAGNOSIS — E119 Type 2 diabetes mellitus without complications: Secondary | ICD-10-CM | POA: Insufficient documentation

## 2019-02-08 DIAGNOSIS — I350 Nonrheumatic aortic (valve) stenosis: Secondary | ICD-10-CM | POA: Diagnosis not present

## 2019-02-08 DIAGNOSIS — I10 Essential (primary) hypertension: Secondary | ICD-10-CM | POA: Insufficient documentation

## 2019-02-08 DIAGNOSIS — D72820 Lymphocytosis (symptomatic): Secondary | ICD-10-CM

## 2019-02-08 DIAGNOSIS — K589 Irritable bowel syndrome without diarrhea: Secondary | ICD-10-CM | POA: Insufficient documentation

## 2019-02-08 DIAGNOSIS — Z87891 Personal history of nicotine dependence: Secondary | ICD-10-CM | POA: Insufficient documentation

## 2019-02-08 DIAGNOSIS — E669 Obesity, unspecified: Secondary | ICD-10-CM | POA: Diagnosis not present

## 2019-02-08 DIAGNOSIS — Z79899 Other long term (current) drug therapy: Secondary | ICD-10-CM | POA: Insufficient documentation

## 2019-02-08 DIAGNOSIS — M1990 Unspecified osteoarthritis, unspecified site: Secondary | ICD-10-CM | POA: Diagnosis not present

## 2019-02-08 DIAGNOSIS — Z7984 Long term (current) use of oral hypoglycemic drugs: Secondary | ICD-10-CM | POA: Diagnosis not present

## 2019-02-08 DIAGNOSIS — K219 Gastro-esophageal reflux disease without esophagitis: Secondary | ICD-10-CM | POA: Insufficient documentation

## 2019-02-08 LAB — CBC WITH DIFFERENTIAL/PLATELET
Abs Immature Granulocytes: 0.04 10*3/uL (ref 0.00–0.07)
Basophils Absolute: 0.1 10*3/uL (ref 0.0–0.1)
Basophils Relative: 0 %
Eosinophils Absolute: 0.5 10*3/uL (ref 0.0–0.5)
Eosinophils Relative: 4 %
HCT: 39.3 % (ref 39.0–52.0)
Hemoglobin: 13 g/dL (ref 13.0–17.0)
Immature Granulocytes: 0 %
Lymphocytes Relative: 55 %
Lymphs Abs: 8.1 10*3/uL — ABNORMAL HIGH (ref 0.7–4.0)
MCH: 30.7 pg (ref 26.0–34.0)
MCHC: 33.1 g/dL (ref 30.0–36.0)
MCV: 92.7 fL (ref 80.0–100.0)
Monocytes Absolute: 0.7 10*3/uL (ref 0.1–1.0)
Monocytes Relative: 5 %
Neutro Abs: 5.2 10*3/uL (ref 1.7–7.7)
Neutrophils Relative %: 36 %
Platelets: 223 10*3/uL (ref 150–400)
RBC: 4.24 MIL/uL (ref 4.22–5.81)
RDW: 13.5 % (ref 11.5–15.5)
WBC: 14.5 10*3/uL — ABNORMAL HIGH (ref 4.0–10.5)
nRBC: 0 % (ref 0.0–0.2)

## 2019-02-08 LAB — CMP (CANCER CENTER ONLY)
ALT: 18 U/L (ref 0–44)
AST: 18 U/L (ref 15–41)
Albumin: 4.3 g/dL (ref 3.5–5.0)
Alkaline Phosphatase: 62 U/L (ref 38–126)
Anion gap: 9 (ref 5–15)
BUN: 29 mg/dL — ABNORMAL HIGH (ref 8–23)
CO2: 24 mmol/L (ref 22–32)
Calcium: 9.7 mg/dL (ref 8.9–10.3)
Chloride: 107 mmol/L (ref 98–111)
Creatinine: 1.67 mg/dL — ABNORMAL HIGH (ref 0.61–1.24)
GFR, Est AFR Am: 44 mL/min — ABNORMAL LOW (ref >60)
GFR, Estimated: 38 mL/min — ABNORMAL LOW (ref >60)
Glucose, Bld: 196 mg/dL — ABNORMAL HIGH (ref 70–99)
Potassium: 4.6 mmol/L (ref 3.5–5.1)
Sodium: 140 mmol/L (ref 135–145)
Total Bilirubin: 0.3 mg/dL (ref 0.3–1.2)
Total Protein: 7.5 g/dL (ref 6.5–8.1)

## 2019-02-08 LAB — HEPATITIS C ANTIBODY: HCV Ab: NONREACTIVE

## 2019-02-08 LAB — LACTATE DEHYDROGENASE: LDH: 176 U/L (ref 98–192)

## 2019-02-08 NOTE — Patient Instructions (Signed)
Thank you for choosing [facility] to provide your oncology and hematology care.   Should you have questions after your visit to the [facility] ([facility]), please contact this office at [phone] between 8:30 AM and 4:30 PM.  Voice mails left after 4:00 PM may not be returned until the following business day.  Calls received after 4:30 PM will be answered by an off-site Nurse Triage Line.    Prescription Refills:  Please have your pharmacy contact us directly for most prescription requests.  Contact the office directly for refills of narcotics (pain medications). Allow 48-72 hours for refills.  Appointments: Please contact the [facility] scheduling department [phone] for questions regarding [facility] appointment scheduling.  Contact the schedulers with any scheduling changes so that your appointment can be rescheduled in a timely manner.   Central Scheduling for [facility] [phone] - Call to schedule procedures such as PET scans, CT scans, MRI, Ultrasound, etc.  To afford each patient quality time with our providers, please arrive 30 minutes before your scheduled appointment time.  If you arrive late for your appointment, you may be asked to reschedule.  We strive to give you quality time with our providers, and arriving late affects you and other patients whose appointments are after yours. If you are a no show for multiple scheduled visits, you may be dismissed from the clinic at the providers discretion.     Resources: [facility] Social Workers [phone] for additional information on assistance programs or assistance connecting with community support programs   [place] DSS  [phone]: Information regarding food stamps, Medicaid, and utility assistance [place] [phone]   [place] [employer]'s shared-ride transportation service for eligible riders who have a disability that prevents them from riding the fixed route bus.   Medicare Rights Center  800-333-4114 Helps people with Medicare understand their rights and benefits, navigate the Medicare system, and secure the quality healthcare they deserve [employer] 800-227-2345 Assists patients locate various types of support and financial assistance Cancer Care: 1-800-813-HOPE (4673) Provides financial assistance, online support groups, medication/co-pay assistance.   Transportation Assistance for appointments at [facility]: Transportation Coordinator [phone]  Again, thank you for choosing [facility] for your care.       

## 2019-02-09 ENCOUNTER — Encounter (HOSPITAL_COMMUNITY): Payer: Medicare Other

## 2019-02-09 ENCOUNTER — Ambulatory Visit (HOSPITAL_COMMUNITY): Payer: Medicare Other

## 2019-02-09 ENCOUNTER — Ambulatory Visit (HOSPITAL_COMMUNITY)
Admission: RE | Admit: 2019-02-09 | Discharge: 2019-02-09 | Disposition: A | Discharge status: Home / Self Care | Payer: Medicare Other | Attending: Cardiovascular Disease | Admitting: Cardiovascular Disease

## 2019-02-09 ENCOUNTER — Other Ambulatory Visit: Payer: Self-pay | Admitting: Interventional Cardiology

## 2019-02-09 LAB — SURGICAL PATHOLOGY

## 2019-02-10 LAB — FLOW CYTOMETRY

## 2019-02-11 ENCOUNTER — Telehealth: Payer: Self-pay | Admitting: Hematology

## 2019-02-11 NOTE — Telephone Encounter (Signed)
Scheduled per 02/02 los, patient has been called and notified.

## 2019-02-16 ENCOUNTER — Ambulatory Visit: Payer: Medicare Other | Admitting: Physical Therapy

## 2019-02-16 ENCOUNTER — Encounter: Payer: Medicare Other | Admitting: Surgery

## 2019-02-21 DIAGNOSIS — R972 Elevated prostate specific antigen [PSA]: Secondary | ICD-10-CM | POA: Diagnosis not present

## 2019-02-23 ENCOUNTER — Inpatient Hospital Stay (HOSPITAL_BASED_OUTPATIENT_CLINIC_OR_DEPARTMENT_OTHER): Payer: Medicare Other | Admitting: Hematology

## 2019-02-23 DIAGNOSIS — I2510 Atherosclerotic heart disease of native coronary artery without angina pectoris: Secondary | ICD-10-CM | POA: Diagnosis not present

## 2019-02-23 DIAGNOSIS — Z9861 Coronary angioplasty status: Secondary | ICD-10-CM

## 2019-02-23 DIAGNOSIS — C9110 Chronic lymphocytic leukemia of B-cell type not having achieved remission: Secondary | ICD-10-CM | POA: Diagnosis not present

## 2019-02-23 NOTE — Progress Notes (Signed)
HEMATOLOGY/ONCOLOGY CONSULTATION NOTE  Date of Service: 02/23/2019  Patient Care Team: Josetta Huddle, MD as PCP - General (Internal Medicine) Belva Crome, MD as PCP - Cardiology (Cardiology)  CHIEF COMPLAINTS/PURPOSE OF CONSULTATION:  Leukocytosis/Lymphocytosis  HISTORY OF PRESENTING ILLNESS:   Barry Zavala is a wonderful 79 y.o. male who has been referred to [place] by Dr Josetta Huddle for evaluation and management of leukocytosis (possible lymphoma). We are joined by his wife, Barry Zavala, via phone. The pt reports that he is doing well overall.  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 He went to see his Dermatologist, in the summer, who prescribed 5% Betamethasone Dipropionate. He has been having to use this cream over a large portion of his body. His rash has improved but has not gone away.    He received his first dose of the COVID19 vaccine on 01/14/2019. His WBC was around 17K at this time. He saw his Cardiologist, Dr. Tamala Julian, on 02/02/2019 and found that his WBC had decreased to 13K. He received the second dose of the COVID19 vaccine on 02/04/2019.   He has not felt any different in the last 3-6 months. His other chronic conditions are currently being well managed. Pt golfs 4-5 days per week if the weather allows. He has been on Jardiance for some time and has not needed to be on steroids for any reason within the last 6 months. Pt was a previous smoker and quit in 1988. He does not drink any alcohol. Pt is a retired Engineer, technical sales. Pt has had some previous dental issues that were corrected by a Periodontist. He denies any current issues with his gums. Pt has never needed a blood transfusion and has not had any tattoos. He has benign prostatitis and there have been recent elevations in PSA levels. His sister passed from Multiple Myeloma but he is not aware of any other blood disorder or cancers.   Most recent lab results (02/02/2019) of CBC and BMP is as follows: all  values are WNL except for WBC at 13.9K, RBC at 4.05, Hgb at 12.5, HCT at 38.1  On review of systems, pt denies fevers, chills, night sweats, unexpected weight loss, abdominal pain, chest pain, SOB, new lumps/bumps, sore throat, rhinorrhea, dysuria, hematuria, difficulty starting urine stream, forcing urine stream and any other symptoms.   On PMHx the pt reports Benign Prostatitis, CAD, Elevated PSA, GERD, HLD, HTN, Type 2 Diabetes. On Social Hx the pt reports that he is a previous smoker (quit in 1988), does not drink any alcohol. He is a retired Chief Executive Officer. On Family Hx the pt reports that he had a sister who passed from Multiple Myeloma  INTERVAL HISTORY:   I connected with  Barry Zavala on 02/23/19 by telephone and verified that I am speaking with the correct person using two identifiers.   I discussed the limitations of evaluation and management by telemedicine. The patient expressed understanding and agreed to proceed.  Other persons participating in the visit and their role in the encounter:       -Barry Zavala, Medical Scribe      -Pt's wife, Barry Zavala  Patient's location: Home  Provider's location: [place] [facility] [place] is a wonderful 79 y.o. male who is here for evaluation and management of leukocytosis (possible lymphoma). The patient's last visit with [place] was on 02/08/2019. The pt reports that he is doing well overall.  The pt reports that he is going to see Dr.  Gates in June or July and plans on following with him every 6 months. His wife reports that pt saw a Urologist last week and he noted an elevated PSA level. He will continue to follow with Urology every 6 months. Pt has felt well in the interim and has no new concerns or symptoms.  Of note since the patient's last visit, pt has had Flow Pathology Report completed on 02/08/2019 with results revealing "Monoclonal B-cell population identified."  Pt also has had FISH Panel completed on 02/08/2019 with  results revealing "Trisomy +12 is present".  Lab results (02/08/19) of CBC w/diff and CMP is as follows: all values are WNL except for WBC at 14.5K, Lymphs Abs at 8.1, Glucose at 196, BUN at 29, Creatinine at 1.67, GFR Est Non Af Am at 38. 02/08/2019 LDH at 176 02/08/2019 HCV Ab is "Non Reactive"  On review of systems, pt denies any other symptoms.   MEDICAL HISTORY:  Past Medical History:  Diagnosis Date  . Anemia   . Aortic stenosis, moderate   . Arthritis    hands  . Coronary artery disease   . Elevated PSA   . Erectile dysfunction   . GERD (gastroesophageal reflux disease)   . Hyperlipidemia   . Hypertension   . Irritable bowel syndrome   . Obesity   . Onychomycosis   . Palmar fibromatosis   . Right bundle branch block   . Type 2 diabetes mellitus ([place])     SURGICAL HISTORY: Past Surgical History:  Procedure Laterality Date  . APPENDECTOMY    . cardiac stents    . CARPAL TUNNEL RELEASE Left 12/09/2016   Procedure: CARPAL TUNNEL RELEASE;  Surgeon: Daryll Brod, MD;  Location: [facility];  Service: Orthopedics;  Laterality: Left;  . CARPAL TUNNEL WITH CUBITAL TUNNEL Right 01/13/2017   Procedure: RIGHT CARPAL TUNNEL WITH CUBITAL TUNNEL RELEASE;  Surgeon: Daryll Brod, MD;  Location: [facility];  Service: Orthopedics;  Laterality: Right;  . RIGHT/LEFT HEART CATH AND CORONARY ANGIOGRAPHY N/A 02/02/2019   Procedure: RIGHT/LEFT HEART CATH AND CORONARY ANGIOGRAPHY;  Surgeon: Belva Crome, MD;  Location: [facility];  Service: Cardiovascular;  Laterality: N/A;  . TRIGGER FINGER RELEASE Left 08/10/2018   Procedure: RELEASE TRIGGER FINGER/A-1 PULLEY THUMB AND LEFT SMALL;  Surgeon: Daryll Brod, MD;  Location: [place];  Service: Orthopedics;  Laterality: Left;  FAB  . ULNAR NERVE TRANSPOSITION Left 12/09/2016   Procedure: DECOMPRESSION LEFT ULNAR NERVE;  Surgeon: Daryll Brod, MD;  Location: [place];  Service:  Orthopedics;  Laterality: Left;  . ULNAR NERVE TRANSPOSITION Right 01/13/2017   Procedure: DECOMPRESSION ULNAR NERVE;  Surgeon: Daryll Brod, MD;  Location: [place];  Service: Orthopedics;  Laterality: Right;    SOCIAL HISTORY: Social History   Socioeconomic History  . Marital status: Married    Spouse name: Not on file  . Number of children: 2  . Years of education: Not on file  . Highest education level: Not on file  Occupational History  . Occupation: Retired Chief Executive Officer  Tobacco Use  . Smoking status: Former Smoker    Packs/day: 1.00    Years: 20.00    Pack years: 20.00    Types: Cigarettes    Quit date: 1988    Years since quitting: 33.1  . Smokeless tobacco: Never Used  Substance and Sexual Activity  . Alcohol use: Yes    Alcohol/week: 0.0 standard drinks    Comment:  social  . Drug use: No  . Sexual activity: Not on file  Other Topics Concern  . Not on file  Social History Narrative  . Not on file   Social Determinants of Health   Financial Resource Strain:   . Difficulty of Paying Living Expenses: Not on file  Food Insecurity:   . Worried About Charity fundraiser in the Last Year: Not on file  . Ran Out of Food in the Last Year: Not on file  Transportation Needs:   . Lack of Transportation (Medical): Not on file  . Lack of Transportation (Non-Medical): Not on file  Physical Activity:   . Days of Exercise per Week: Not on file  . Minutes of Exercise per Session: Not on file  Stress:   . Feeling of Stress : Not on file  Social Connections:   . Frequency of Communication with Friends and Family: Not on file  . Frequency of Social Gatherings with Friends and Family: Not on file  . Attends Religious Services: Not on file  . Active Member of Clubs or Organizations: Not on file  . Attends Archivist Meetings: Not on file  . Marital Status: Not on file  Intimate Partner Violence:   . Fear of Current or Ex-Partner: Not on file  .  Emotionally Abused: Not on file  . Physically Abused: Not on file  . Sexually Abused: Not on file    FAMILY HISTORY: Family History  Problem Relation Age of Onset  . Ovarian cancer Mother 75  . Heart failure Father   . Cancer Paternal Uncle        [place] Sarcoma  . Multiple myeloma Sister 72    ALLERGIES:  is allergic to other.  MEDICATIONS:  Current Outpatient Medications  Medication Sig Dispense Refill  . ACTOS 45 MG tablet Take 45 mg by mouth daily with supper.   1  . aspirin EC 81 MG tablet Take 1 tablet (81 mg total) by mouth daily. 90 tablet 3  . Empagliflozin (JARDIANCE) 10 MG TABS Take 10 mg by mouth daily.     . Ergocalciferol (VITAMIN D2) 2000 units TABS Take 2,000 Units by mouth daily.    . Glucosamine-Chondroitin (GLUCOSAMINE CHONDR COMPLEX PO) Take 1 capsule by mouth 2 (two) times daily.    Marland Kitchen glyBURIDE-metformin (GLUCOVANCE) 5-500 MG per tablet Take 2 tablets by mouth 2 (two) times daily with a meal.    . metoprolol succinate (TOPROL-XL) 50 MG 24 hr tablet Take 50 mg by mouth at bedtime. Take with or immediately following a meal.     . Multiple Vitamin (MULTIVITAMIN WITH MINERALS) TABS tablet Take 1 tablet by mouth daily. Centrum silver    . omega-3 acid ethyl esters (LOVAZA) 1 G capsule Take 1 g by mouth 3 (three) times daily.    Marland Kitchen omeprazole (PRILOSEC) 20 MG capsule Take 20 mg by mouth 2 (two) times a week. Mondays & Thursdays.    . ONGLYZA 5 MG TABS tablet Take 5 mg by mouth daily with supper.   2  . ramipril (ALTACE) 10 MG capsule Take 10 mg by mouth daily with supper.     Marland Kitchen VYTORIN 10-20 MG tablet TAKE 1 TABLET BY MOUTH DAILY. 90 tablet 3   No current facility-administered medications for this visit.    REVIEW OF SYSTEMS:   A 10+ POINT REVIEW OF SYSTEMS WAS OBTAINED including neurology, dermatology, psychiatry, cardiac, respiratory, lymph, extremities, GI, GU, Musculoskeletal, constitutional, breasts, reproductive, HEENT.  All  pertinent positives are noted in  the HPI.  All others are negative.   PHYSICAL EXAMINATION: ECOG PERFORMANCE STATUS: 1 - Symptomatic but completely ambulatory  Telehealth visit   LABORATORY DATA:  I have reviewed the data as listed  . CBC Latest Ref Rng & Units 02/08/2019 02/02/2019 02/02/2019  WBC 4.0 - 10.5 K/uL 14.5(H) - -  Hemoglobin 13.0 - 17.0 g/dL 13.0 12.2(L) 12.2(L)  Hematocrit 39.0 - 52.0 % 39.3 36.0(L) 36.0(L)  Platelets 150 - 400 K/uL 223 - -    . CMP Latest Ref Rng & Units 02/08/2019 02/02/2019 02/02/2019  Glucose 70 - 99 mg/dL 196(H) - -  BUN 8 - 23 mg/dL 29(H) - -  Creatinine 0.61 - 1.24 mg/dL 1.67(H) - -  Sodium 135 - 145 mmol/L 140 141 142  Potassium 3.5 - 5.1 mmol/L 4.6 4.4 4.4  Chloride 98 - 111 mmol/L 107 - -  CO2 22 - 32 mmol/L 24 - -  Calcium 8.9 - 10.3 mg/dL 9.7 - -  Total Protein 6.5 - 8.1 g/dL 7.5 - -  Total Bilirubin 0.3 - 1.2 mg/dL 0.3 - -  Alkaline Phos 38 - 126 U/L 62 - -  AST 15 - 41 U/L 18 - -  ALT 0 - 44 U/L 18 - -   02/08/2019 FISH (CLL):   02/08/2019 Flow Pathology:     WBC  Lymphs Abs   12/05/2015 10.1K 3.5K  12/10/2017 12.0K 7.0K  12/15/2018 14.9K 8.0K   RADIOGRAPHIC STUDIES: I have personally reviewed the radiological images as listed and agreed with the findings in the report. CARDIAC CATHETERIZATION  Result Date: 02/02/2019  Significant calcific aortic stenosis that by Cath Lab calculations suggest moderate hemodynamic impairment.  Peak to peak gradient approximately 50 mmHg with a calculated valve area of 1.28 cm.  Echo data suggests a transvalvular velocity greater than 4 m/s and demonstrated a lower calculated aortic valve area.  Left main is widely patent  LAD contains a mid first generation nondrug-eluting stent.  The stent is widely patent.  There is moderate diffuse disease in the ostial to mid LAD less than 40%.  Circumflex artery contains a proximal stent that extends into the large first obtuse marginal and is widely patent.  The jailed continuation of the  circumflex is widely patent giving origin to 2 small obtuse marginals.  RCA contains diffuse luminal irregularities with distal 50 to 70% narrowing.  Not intervened upon as the patient is totally asymptomatic with reference to angina complaints.  Normal right heart pressures. RECOMMENDATIONS:  To see Dr. Angelena Form on February 1 in the valve clinic to consider transaortic valve replacement.  Continue current therapy.  Watchful waiting relative to coronary atherosclerosis.  Aggressive preventive therapy and risk factor modification --> optimal medical therapy.   ASSESSMENT & PLAN:   79 yo with   1)Newly diagnosed Chronic lymphocytic leukemia  PLAN: -Discussed pt labwork, 02/08/19; Lymphocytosis, no anemia, Creatinine elevated, other blood counts are steady -Discussed 02/08/2019 LDH is WNL -Discussed 02/08/2019 HCV Ab is "Non Reactive" -Discussed 02/08/2019 FISH Panel which revealed "Trisomy +12 is present" - average risk profile -Discussed 02/08/2019 Flow Pathology Report which revealed "Monoclonal B-cell population identified." -Labs consistent with Chronic Lymphocytic Leukemia  -Pt had no lymphadenopathy, no splenomegaly and no clinical symptoms  -Advised pt that this may be a Rai stage 0 CLL -Advised pt that his CLL appears to be slow moving based on labs  -Not unreasonable to monitor with labs and clinic visits every 6 months  -  Advised pt that treating prior to symptoms does not statistically lead to better health outcomes -Advised pt that we would move to treat if CLL is causing bulky disease, threatened end organs, cytopenias, or consitutional symptoms -Discussed symptoms to be mindful of such as: fatigue, fevers, chills, night sweats, unexpected weight loss, or abdominal pain -Continue to f/u with PCP, Dermatology, and Urology as scheduled -Discussed a PET/CT or CT w/o contrast for staging  -Will see back in 6 months with labs -Advised pt to contact if there is a change in  symptomology    FOLLOW UP: PET/CT in 22 weeks RTC with Dr Irene Limbo with labs in 6 months  The total time spent in the appt was 30 minutes and more than 50% was on counseling and direct patient cares.  All of the patient's questions were answered with apparent satisfaction. The patient knows to call the clinic with any problems, questions or concerns.    Sullivan Lone MD [place] AAHIVMS [facility] Hematology/Oncology Physician [facility]  (Office):       [phone] (Work cell):  [phone] (Fax):           [phone]  02/23/2019 4:44 PM  I, Barry Zavala, am acting as a scribe for Dr. Sullivan Lone.   .I have reviewed the above documentation for accuracy and completeness, and I agree with the above. Brunetta Genera MD

## 2019-02-24 LAB — FISH,CLL PROGNOSTIC PANEL

## 2019-03-30 DIAGNOSIS — Z03818 Encounter for observation for suspected exposure to other biological agents ruled out: Secondary | ICD-10-CM | POA: Diagnosis not present

## 2019-03-30 DIAGNOSIS — Z20822 Contact with and (suspected) exposure to covid-19: Secondary | ICD-10-CM | POA: Diagnosis not present

## 2019-04-22 DIAGNOSIS — C9110 Chronic lymphocytic leukemia of B-cell type not having achieved remission: Secondary | ICD-10-CM | POA: Diagnosis not present

## 2019-04-22 DIAGNOSIS — I779 Disorder of arteries and arterioles, unspecified: Secondary | ICD-10-CM | POA: Diagnosis not present

## 2019-04-22 DIAGNOSIS — I350 Nonrheumatic aortic (valve) stenosis: Secondary | ICD-10-CM | POA: Diagnosis not present

## 2019-04-22 DIAGNOSIS — E7849 Other hyperlipidemia: Secondary | ICD-10-CM | POA: Diagnosis not present

## 2019-04-22 DIAGNOSIS — N1832 Chronic kidney disease, stage 3b: Secondary | ICD-10-CM | POA: Diagnosis not present

## 2019-04-22 DIAGNOSIS — I5189 Other ill-defined heart diseases: Secondary | ICD-10-CM | POA: Diagnosis not present

## 2019-04-22 DIAGNOSIS — I10 Essential (primary) hypertension: Secondary | ICD-10-CM | POA: Diagnosis not present

## 2019-04-22 DIAGNOSIS — E1129 Type 2 diabetes mellitus with other diabetic kidney complication: Secondary | ICD-10-CM | POA: Diagnosis not present

## 2019-05-05 ENCOUNTER — Encounter: Payer: Self-pay | Admitting: Interventional Cardiology

## 2019-05-05 ENCOUNTER — Other Ambulatory Visit: Payer: Self-pay

## 2019-05-05 ENCOUNTER — Ambulatory Visit (INDEPENDENT_AMBULATORY_CARE_PROVIDER_SITE_OTHER): Payer: Medicare Other | Admitting: Interventional Cardiology

## 2019-05-05 VITALS — BP 112/56 | HR 79 | Ht 70.0 in | Wt 185.1 lb

## 2019-05-05 DIAGNOSIS — Z7189 Other specified counseling: Secondary | ICD-10-CM

## 2019-05-05 DIAGNOSIS — I2510 Atherosclerotic heart disease of native coronary artery without angina pectoris: Secondary | ICD-10-CM | POA: Diagnosis not present

## 2019-05-05 DIAGNOSIS — I10 Essential (primary) hypertension: Secondary | ICD-10-CM

## 2019-05-05 DIAGNOSIS — E119 Type 2 diabetes mellitus without complications: Secondary | ICD-10-CM | POA: Diagnosis not present

## 2019-05-05 DIAGNOSIS — I350 Nonrheumatic aortic (valve) stenosis: Secondary | ICD-10-CM | POA: Diagnosis not present

## 2019-05-05 DIAGNOSIS — Z9861 Coronary angioplasty status: Secondary | ICD-10-CM | POA: Diagnosis not present

## 2019-05-05 DIAGNOSIS — I4510 Unspecified right bundle-branch block: Secondary | ICD-10-CM

## 2019-05-05 NOTE — Patient Instructions (Signed)

## 2019-05-05 NOTE — Progress Notes (Signed)
Cardiology Office Note:    Date:  05/05/2019   ID:  Barry Zavala, DOB 1940/04/28, MRN [id number]  PCP:  Josetta Huddle, MD  Cardiologist:  Sinclair Grooms, MD   Referring MD: Josetta Huddle, MD   Chief Complaint  Patient presents with  . Cardiac Valve Problem    Aortic stenosis  . Coronary Artery Disease    History of Present Illness:    Barry Zavala is a 79 y.o. male with a hx of remote MI in 1995, and PCI in 2001. Myoview in Feb 2017 was low risk, NIDDM, HTN, HLD, CRI,significant aortic stenosis,and RBBB.  Cardiac cath in January demonstrated moderate aortic stenosis with data been discordant compared to echo data.  He was referred to the TAVR clinic and seen by Dr. Angelena Form.  Since the patient is totally asymptomatic the decision was made to continue to follow.  He is in today, having just play golf earlier today.  He feels well.  He has not had syncope, shortness of breath, or decrease in exertional tolerance.  He denies angina.  Past Medical History:  Diagnosis Date  . Anemia   . Aortic stenosis, moderate   . Arthritis    hands  . Coronary artery disease   . Elevated PSA   . Erectile dysfunction   . GERD (gastroesophageal reflux disease)   . Hyperlipidemia   . Hypertension   . Irritable bowel syndrome   . Obesity   . Onychomycosis   . Palmar fibromatosis   . Right bundle branch block   . Type 2 diabetes mellitus ([place])     Past Surgical History:  Procedure Laterality Date  . APPENDECTOMY    . cardiac stents    . CARPAL TUNNEL RELEASE Left 12/09/2016   Procedure: CARPAL TUNNEL RELEASE;  Surgeon: Daryll Brod, MD;  Location: [place];  Service: Orthopedics;  Laterality: Left;  . CARPAL TUNNEL WITH CUBITAL TUNNEL Right 01/13/2017   Procedure: RIGHT CARPAL TUNNEL WITH CUBITAL TUNNEL RELEASE;  Surgeon: Daryll Brod, MD;  Location: [place];  Service: Orthopedics;  Laterality: Right;  . RIGHT/LEFT HEART CATH AND CORONARY  ANGIOGRAPHY N/A 02/02/2019   Procedure: RIGHT/LEFT HEART CATH AND CORONARY ANGIOGRAPHY;  Surgeon: Belva Crome, MD;  Location: [facility];  Service: Cardiovascular;  Laterality: N/A;  . TRIGGER FINGER RELEASE Left 08/10/2018   Procedure: RELEASE TRIGGER FINGER/A-1 PULLEY THUMB AND LEFT SMALL;  Surgeon: Daryll Brod, MD;  Location: [place];  Service: Orthopedics;  Laterality: Left;  FAB  . ULNAR NERVE TRANSPOSITION Left 12/09/2016   Procedure: DECOMPRESSION LEFT ULNAR NERVE;  Surgeon: Daryll Brod, MD;  Location: [facility];  Service: Orthopedics;  Laterality: Left;  . ULNAR NERVE TRANSPOSITION Right 01/13/2017   Procedure: DECOMPRESSION ULNAR NERVE;  Surgeon: Daryll Brod, MD;  Location: [place];  Service: Orthopedics;  Laterality: Right;    Current Medications: Current Meds  Medication Sig  . ACTOS 45 MG tablet Take 45 mg by mouth daily with supper.   Marland Kitchen aspirin EC 81 MG tablet Take 1 tablet (81 mg total) by mouth daily.  . Cholecalciferol (VITAMIN D3 SUPER STRENGTH) 50 MCG (2000 UT) TABS Take 2,000 Units by mouth daily.  . Empagliflozin (JARDIANCE) 10 MG TABS Take 10 mg by mouth daily.   . Glucosamine-Chondroitin (GLUCOSAMINE CHONDR COMPLEX PO) Take 1 capsule by mouth 2 (two) times daily.  Marland Kitchen glyBURIDE-metformin (GLUCOVANCE) 5-500 MG per tablet Take 2 tablets by mouth 2 (two)  times daily with a meal.  . metoprolol succinate (TOPROL-XL) 50 MG 24 hr tablet Take 50 mg by mouth at bedtime. Take with or immediately following a meal.   . Multiple Vitamin (MULTIVITAMIN WITH MINERALS) TABS tablet Take 1 tablet by mouth daily. Centrum silver  . omega-3 acid ethyl esters (LOVAZA) 1 G capsule Take 1 g by mouth 3 (three) times daily.  Marland Kitchen omeprazole (PRILOSEC) 20 MG capsule Take 20 mg by mouth 2 (two) times a week. Mondays & Thursdays.  . ONGLYZA 5 MG TABS tablet Take 5 mg by mouth daily with supper.   . ramipril (ALTACE) 10 MG capsule Take 10 mg by mouth  daily with supper.   Marland Kitchen VYTORIN 10-20 MG tablet TAKE 1 TABLET BY MOUTH DAILY.     Allergies:   Other   Social History   Socioeconomic History  . Marital status: Married    Spouse name: Not on file  . Number of children: 2  . Years of education: Not on file  . Highest education level: Not on file  Occupational History  . Occupation: Retired Chief Executive Officer  Tobacco Use  . Smoking status: Former Smoker    Packs/day: 1.00    Years: 20.00    Pack years: 20.00    Types: Cigarettes    Quit date: 1988    Years since quitting: 33.3  . Smokeless tobacco: Never Used  Substance and Sexual Activity  . Alcohol use: Yes    Alcohol/week: 0.0 standard drinks    Comment: social  . Drug use: No  . Sexual activity: Not on file  Other Topics Concern  . Not on file  Social History Narrative  . Not on file   Social Determinants of Health   Financial Resource Strain:   . Difficulty of Paying Living Expenses:   Food Insecurity:   . Worried About Charity fundraiser in the Last Year:   . Arboriculturist in the Last Year:   Transportation Needs:   . Film/video editor (Medical):   Marland Kitchen Lack of Transportation (Non-Medical):   Physical Activity:   . Days of Exercise per Week:   . Minutes of Exercise per Session:   Stress:   . Feeling of Stress :   Social Connections:   . Frequency of Communication with Friends and Family:   . Frequency of Social Gatherings with Friends and Family:   . Attends Religious Services:   . Active Member of Clubs or Organizations:   . Attends Archivist Meetings:   Marland Kitchen Marital Status:      Family History: The patient's family history includes Cancer in his paternal uncle; Heart failure in his father; Multiple myeloma (age of onset: 76) in his sister; Ovarian cancer (age of onset: 29) in his mother.  ROS:   Please see the history of present illness.    No complaints all other systems reviewed and are negative.  EKGs/Labs/Other Studies Reviewed:    The  following studies were reviewed today: Coronary angiography performed January 2021: Diagnostic Dominance: Right  Conclusion   Significant calcific aortic stenosis that by Cath Lab calculations suggest moderate hemodynamic impairment.  Peak to peak gradient approximately 50 mmHg with a calculated valve area of 1.28 cm.  Echo data suggests a transvalvular velocity greater than 4 m/s and demonstrated a lower calculated aortic valve area.  Left main is widely patent  LAD contains a mid first generation nondrug-eluting stent.  The stent is widely patent.  There is  moderate diffuse disease in the ostial to mid LAD less than 40%.  Circumflex artery contains a proximal stent that extends into the large first obtuse marginal and is widely patent.  The jailed continuation of the circumflex is widely patent giving origin to 2 small obtuse marginals.  RCA contains diffuse luminal irregularities with distal 50 to 70% narrowing.  Not intervened upon as the patient is totally asymptomatic with reference to angina complaints.  Normal right heart pressures.  RECOMMENDATIONS:   To see Dr. Angelena Form on February 1 in the valve clinic to consider transaortic valve replacement.  Continue current therapy.  Watchful waiting relative to coronary atherosclerosis.  Aggressive preventive therapy and risk factor modification --> optimal medical therapy.     EKG:  EKG not repeated  Recent Labs: 02/08/2019: ALT 18; BUN 29; Creatinine 1.67; Hemoglobin 13.0; Platelets 223; Potassium 4.6; Sodium 140  Recent Lipid Panel    Component Value Date/Time   CHOL 83 (L) 01/24/2015 0949   TRIG 49 01/24/2015 0949   HDL 38 (L) 01/24/2015 0949   CHOLHDL 2.2 01/24/2015 0949   VLDL 10 01/24/2015 0949   LDLCALC 35 01/24/2015 0949    Physical Exam:    VS:  BP (!) 112/56   Pulse 79   Ht _0  (1.778 m)   Wt 185 lb 1.9 oz (84 kg)   SpO2 95%   BMI 26.56 kg/m     Wt Readings from Last 3 Encounters:  05/05/19 185  lb 1.9 oz (84 kg)  02/08/19 191 lb 9.6 oz (86.9 kg)  02/07/19 185 lb (83.9 kg)     GEN: Mildly overweight. No acute distress HEENT: Normal NECK: No JVD. LYMPHATICS: No lymphadenopathy CARDIAC: 3/6 right upper sternal crescendo decrescendo systolic murmur.  RRR without murmur, gallop, or edema. VASCULAR:  Normal Pulses. No bruits. RESPIRATORY:  Clear to auscultation without rales, wheezing or rhonchi  ABDOMEN: Soft, non-tender, non-distended, No pulsatile mass, MUSCULOSKELETAL: No deformity  SKIN: Warm and dry NEUROLOGIC:  Alert and oriented x 3 PSYCHIATRIC:  Normal affect   ASSESSMENT:    1. Severe aortic stenosis   2. CAD S/P percutaneous coronary angioplasty   3. Essential hypertension   4. Right bundle branch block   5. Non-insulin treated type 2 diabetes mellitus ([place])   6. Educated about COVID-19 virus infection    PLAN:    In order of problems listed above:  1. Aortic stenosis in the moderate to severe range.  Catheterization hemodynamic data and calculated valve area or moderate.  Echocardiography demonstrated severe aortic stenosis.  In absence of symptoms, decision has been made to continue to follow.  Clinical follow-up will be in 6 months.  He will notify us of shortness of breath, syncope, or angina.  Will likely have an echocardiogram repeated later this year or early 2022. 2. Angiography as noted above.  In absence of angina no plan for coronary intervention in absence of critical stenosis. 3. Blood pressure control is stable. 4. Not discussed 5. That discussed 6. Covid vaccine has been received.  Continue social distancing and mask wearing.  Follow-up in 6 months.  Echocardiogram of the end of the year/January 2022.   Medication Adjustments/Labs and Tests Ordered: Current medicines are reviewed at length with the patient today.  Concerns regarding medicines are outlined above.  No orders of the defined types were placed in this encounter.  No orders of the  defined types were placed in this encounter.   Patient Instructions  Medication Instructions:  Your  physician recommends that you continue on your current medications as directed. Please refer to the Current Medication list given to you today.  *If you need a refill on your cardiac medications before your next appointment, please call your pharmacy*   Lab Work: None If you have labs (blood work) drawn today and your tests are completely normal, you will receive your results only by: Marland Kitchen MyChart Message (if you have MyChart) OR . A paper copy in the mail If you have any lab test that is abnormal or we need to change your treatment, we will call you to review the results.   Testing/Procedures: None   Follow-Up: At [facility], you and your health needs are our priority.  As part of our continuing mission to provide you with exceptional heart care, we have created designated Provider Care Teams.  These Care Teams include your primary Cardiologist (physician) and Advanced Practice Providers (APPs -  Physician Assistants and Nurse Practitioners) who all work together to provide you with the care you need, when you need it.  We recommend signing up for the patient portal called "MyChart".  Sign up information is provided on this After Visit Summary.  MyChart is used to connect with patients for Virtual Visits (Telemedicine).  Patients are able to view lab/test results, encounter notes, upcoming appointments, etc.  Non-urgent messages can be sent to your provider as well.   To learn more about what you can do with MyChart, go to [email or web address].    Your next appointment:   6 month(s)  The format for your next appointment:   In Person  Provider:   You may see Sinclair Grooms, MD or one of the following Advanced Practice Providers on your designated Care Team:    Truitt Merle, NP  Cecilie Kicks, NP  Kathyrn Drown, NP    Other Instructions      Signed, Sinclair Grooms, MD  05/05/2019 5:05 PM    [place]

## 2019-06-15 DIAGNOSIS — I10 Essential (primary) hypertension: Secondary | ICD-10-CM | POA: Diagnosis not present

## 2019-06-15 DIAGNOSIS — E782 Mixed hyperlipidemia: Secondary | ICD-10-CM | POA: Diagnosis not present

## 2019-06-15 DIAGNOSIS — Z23 Encounter for immunization: Secondary | ICD-10-CM | POA: Diagnosis not present

## 2019-06-15 DIAGNOSIS — Z1389 Encounter for screening for other disorder: Secondary | ICD-10-CM | POA: Diagnosis not present

## 2019-06-15 DIAGNOSIS — Z0000 Encounter for general adult medical examination without abnormal findings: Secondary | ICD-10-CM | POA: Diagnosis not present

## 2019-06-15 DIAGNOSIS — R972 Elevated prostate specific antigen [PSA]: Secondary | ICD-10-CM | POA: Diagnosis not present

## 2019-06-15 DIAGNOSIS — Z79899 Other long term (current) drug therapy: Secondary | ICD-10-CM | POA: Diagnosis not present

## 2019-06-15 DIAGNOSIS — E119 Type 2 diabetes mellitus without complications: Secondary | ICD-10-CM | POA: Diagnosis not present

## 2019-06-15 DIAGNOSIS — E559 Vitamin D deficiency, unspecified: Secondary | ICD-10-CM | POA: Diagnosis not present

## 2019-06-15 DIAGNOSIS — I2510 Atherosclerotic heart disease of native coronary artery without angina pectoris: Secondary | ICD-10-CM | POA: Diagnosis not present

## 2019-06-15 DIAGNOSIS — I350 Nonrheumatic aortic (valve) stenosis: Secondary | ICD-10-CM | POA: Diagnosis not present

## 2019-07-18 ENCOUNTER — Encounter: Payer: Self-pay | Admitting: *Deleted

## 2019-07-26 ENCOUNTER — Other Ambulatory Visit: Payer: Self-pay

## 2019-07-26 ENCOUNTER — Ambulatory Visit (INDEPENDENT_AMBULATORY_CARE_PROVIDER_SITE_OTHER): Payer: Medicare Other | Admitting: Dermatology

## 2019-07-26 ENCOUNTER — Encounter: Payer: Self-pay | Admitting: Dermatology

## 2019-07-26 DIAGNOSIS — D225 Melanocytic nevi of trunk: Secondary | ICD-10-CM

## 2019-07-26 DIAGNOSIS — Z85828 Personal history of other malignant neoplasm of skin: Secondary | ICD-10-CM

## 2019-07-26 DIAGNOSIS — L738 Other specified follicular disorders: Secondary | ICD-10-CM

## 2019-07-26 DIAGNOSIS — Z9861 Coronary angioplasty status: Secondary | ICD-10-CM

## 2019-07-26 DIAGNOSIS — S0001XA Abrasion of scalp, initial encounter: Secondary | ICD-10-CM

## 2019-07-26 DIAGNOSIS — L821 Other seborrheic keratosis: Secondary | ICD-10-CM

## 2019-07-26 DIAGNOSIS — L570 Actinic keratosis: Secondary | ICD-10-CM

## 2019-07-26 DIAGNOSIS — I2510 Atherosclerotic heart disease of native coronary artery without angina pectoris: Secondary | ICD-10-CM

## 2019-07-26 DIAGNOSIS — D229 Melanocytic nevi, unspecified: Secondary | ICD-10-CM

## 2019-07-26 NOTE — Patient Instructions (Addendum)
Routine follow-up for Barry Zavala who is a remarkably young looking 79 year old man date of birth 10/06/1940 (happy 79th birthday in 78 months [place]).  All skin examined on legs and waist up today there are no atypical moles or skin cancer.  A small horn on the left side of the nose may be a solar keratosis and was treated with 5-second close freeze.  This will swell and may peel in the next 2 weeks.  There is no special care, but if this fails to respond we will consider future biopsy. Wife  Kalman Shan (present in room) was shown her husband's back where he has multiple keratoses which currently require no treatment.  The keratoses on the left arm are sun exposed but clinically benign and also can be left if stable.  There is a small sebaceous hyperplasia on the right back cheek/sideburn area which does not currently require any treatment.  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  Should these persist for months, I do want him to return.

## 2019-08-02 ENCOUNTER — Other Ambulatory Visit: Payer: Self-pay

## 2019-08-02 ENCOUNTER — Ambulatory Visit (HOSPITAL_COMMUNITY): Payer: Medicare Other | Attending: Cardiology

## 2019-08-02 DIAGNOSIS — I350 Nonrheumatic aortic (valve) stenosis: Secondary | ICD-10-CM | POA: Insufficient documentation

## 2019-08-02 LAB — ECHOCARDIOGRAM COMPLETE
AR max vel: 0.75 cm2
AV Area VTI: 0.86 cm2
AV Area mean vel: 0.77 cm2
AV Mean grad: 36 mmHg
AV Peak grad: 73.6 mmHg
Ao pk vel: 4.29 m/s
Area-P 1/2: 2.34 cm2
S' Lateral: 2.8 cm

## 2019-09-01 NOTE — Progress Notes (Signed)
Structural Heart Clinic Note  Chief Complaint  Patient presents with  . Follow-up    CAD   History of Present Illness: 79 yo male with history of anemia, arthritis, CAD, GERD, hyperlipidemia, diabetes mellitus, RBBB and severe aortic stenosis who is here today for cardiac follow up. I saw him as a new consult in the valve clinic in February 2021. He is known to have CAD with MI in 1995 and PCI in 2001. Cardiac cath 02/02/19 with patent LAD stent, moderate LAD stenosis, patent Circumflex stent, moderate distal RCA stenosis. Echo 01/13/19 with LVEF=60-65%. The aortic valve leaflets are thickened and calcified with limited mobility. Mean gradient 43 mmHg, peak gradient 71.2 mmHg, AVA 0.8 cm2, dimensionless index 0.28. He has chronic kidney disease with baseline creatinine of 1.5. He was asymptomatic when I met him in February 2021 so we did not move forward with planning for TAVR at that time. Echo July 2021 with LVEF=60-65%. The aortic valve leaflets are thickened and calcified with limited leaflet excursion. Mean gradient 36 mmHg, peak gradient 73.6 mmHg, AVA 0.75 cm2. Dimensionless index 0.27.   He is here today for follow up. The patient denies any chest pain, dyspnea, palpitations, lower extremity edema, orthopnea, PND, dizziness, near syncope or syncope. He is very active. He plays golf regularly. He lives with his wife in [place]. He sees his dentist regularly and has no active issues. He is a retired Forensic psychologist.   Primary Care Physician: Josetta Huddle, MD Primary Cardiologist: Daneen Schick Referring Cardiologist: Daneen Schick  Past Medical History:  Diagnosis Date  . Anemia   . Aortic stenosis, moderate   . Arthritis    hands  . Coronary artery disease   . Elevated PSA   . Erectile dysfunction   . GERD (gastroesophageal reflux disease)   . Hyperlipidemia   . Hypertension   . Irritable bowel syndrome   . Obesity   . Onychomycosis   . Palmar fibromatosis   . Right bundle branch  block   . 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 Type 2 diabetes mellitus ([place])     Past Surgical History:  Procedure Laterality Date  . APPENDECTOMY    . cardiac stents    . CARPAL TUNNEL RELEASE Left 12/09/2016   Procedure: CARPAL TUNNEL RELEASE;  Surgeon: Daryll Brod, MD;  Location: [facility];  Service: Orthopedics;  Laterality: Left;  . CARPAL TUNNEL WITH CUBITAL TUNNEL Right 01/13/2017   Procedure: RIGHT CARPAL TUNNEL WITH CUBITAL TUNNEL RELEASE;  Surgeon: Daryll Brod, MD;  Location: [facility];  Service: Orthopedics;  Laterality: Right;  . RIGHT/LEFT HEART CATH AND CORONARY ANGIOGRAPHY N/A 02/02/2019   Procedure: RIGHT/LEFT HEART CATH AND CORONARY ANGIOGRAPHY;  Surgeon: Belva Crome, MD;  Location: [facility];  Service: Cardiovascular;  Laterality: N/A;  . TRIGGER FINGER RELEASE Left 08/10/2018   Procedure: RELEASE TRIGGER FINGER/A-1 PULLEY THUMB AND LEFT SMALL;  Surgeon: Daryll Brod, MD;  Location: [facility];  Service: Orthopedics;  Laterality: Left;  FAB  . ULNAR NERVE TRANSPOSITION Left 12/09/2016   Procedure: DECOMPRESSION LEFT ULNAR NERVE;  Surgeon: Daryll Brod, MD;  Location: [facility];  Service: Orthopedics;  Laterality: Left;  . ULNAR NERVE TRANSPOSITION Right 01/13/2017   Procedure: DECOMPRESSION ULNAR NERVE;  Surgeon: Daryll Brod, MD;  Location: [facility];  Service: Orthopedics;  Laterality: Right;    Current Outpatient Medications  Medication Sig Dispense Refill  .  ACTOS 45 MG tablet Take 45 mg by mouth daily with supper.   1  . aspirin EC 81 MG tablet Take 1 tablet (81 mg total) by mouth daily. 90 tablet 3  . Cholecalciferol (VITAMIN D3 SUPER STRENGTH) 50 MCG (2000 UT) TABS Take 2,000 Units by mouth daily.    . Empagliflozin (JARDIANCE) 10 MG TABS Take 10 mg by mouth daily.     . Glucosamine-Chondroitin (GLUCOSAMINE CHONDR COMPLEX PO) Take 1 capsule by mouth  2 (two) times daily.    Marland Kitchen glyBURIDE-metformin (GLUCOVANCE) 5-500 MG per tablet Take 2 tablets by mouth 2 (two) times daily with a meal.    . metoprolol succinate (TOPROL-XL) 50 MG 24 hr tablet Take 50 mg by mouth at bedtime. Take with or immediately following a meal.     . Multiple Vitamin (MULTIVITAMIN WITH MINERALS) TABS tablet Take 1 tablet by mouth daily. Centrum silver    . omega-3 acid ethyl esters (LOVAZA) 1 G capsule Take 1 g by mouth 3 (three) times daily.    Marland Kitchen omeprazole (PRILOSEC) 20 MG capsule Take 20 mg by mouth 2 (two) times a week. Mondays & Thursdays.    . ONGLYZA 5 MG TABS tablet Take 5 mg by mouth daily with supper.   2  . ramipril (ALTACE) 10 MG capsule Take 10 mg by mouth daily with supper.     Marland Kitchen VYTORIN 10-20 MG tablet TAKE 1 TABLET BY MOUTH DAILY. 90 tablet 3   No current facility-administered medications for this visit.    Allergies  Allergen Reactions  . Other Other (See Comments)    Agent: Vit C,e,zn,cu-omega3-lut-zeax Reaction: GI upset Agent: Vit C,e,zn,cu-omega3-lut-zeax Reaction: GI upset    Social History   Socioeconomic History  . Marital status: Married    Spouse name: Not on file  . Number of children: 2  . Years of education: Not on file  . Highest education level: Not on file  Occupational History  . Occupation: Retired Chief Executive Officer  Tobacco Use  . Smoking status: Former Smoker    Packs/day: 1.00    Years: 20.00    Pack years: 20.00    Types: Cigarettes    Quit date: 1988    Years since quitting: 33.6  . Smokeless tobacco: Never Used  Vaping Use  . Vaping Use: Never used  Substance and Sexual Activity  . Alcohol use: Yes    Alcohol/week: 0.0 standard drinks    Comment: social  . Drug use: No  . Sexual activity: Not on file  Other Topics Concern  . Not on file  Social History Narrative  . Not on file   Social Determinants of Health   Financial Resource Strain:   . Difficulty of Paying Living Expenses: Not on file  Food Insecurity:    . Worried About Charity fundraiser in the Last Year: Not on file  . Ran Out of Food in the Last Year: Not on file  Transportation Needs:   . Lack of Transportation (Medical): Not on file  . Lack of Transportation (Non-Medical): Not on file  Physical Activity:   . Days of Exercise per Week: Not on file  . Minutes of Exercise per Session: Not on file  Stress:   . Feeling of Stress : Not on file  Social Connections:   . Frequency of Communication with Friends and Family: Not on file  . Frequency of Social Gatherings with Friends and Family: Not on file  . Attends Religious Services: Not on file  .  Active Member of Clubs or Organizations: Not on file  . Attends Archivist Meetings: Not on file  . Marital Status: Not on file  Intimate Partner Violence:   . Fear of Current or Ex-Partner: Not on file  . Emotionally Abused: Not on file  . Physically Abused: Not on file  . Sexually Abused: Not on file    Family History  Problem Relation Age of Onset  . Ovarian cancer Mother 51  . Heart failure Father   . Cancer Paternal Uncle        Karposi Sarcoma  . Multiple myeloma Sister 75    Review of Systems:  As stated in the HPI and otherwise negative.   BP 122/70   Pulse 78   Ht $R'5\' 10"'JQ$  (1.778 m)   Wt 183 lb (83 kg)   SpO2 95%   BMI 26.26 kg/m   Physical Examination:  General: Well developed, well nourished, NAD  HEENT: OP clear, mucus membranes moist  SKIN: warm, dry. No rashes. Neuro: No focal deficits  Musculoskeletal: Muscle strength 5/5 all ext  Psychiatric: Mood and affect normal  Neck: No JVD, no carotid bruits, no thyromegaly, no lymphadenopathy.  Lungs:Clear bilaterally, no wheezes, rhonci, crackles Cardiovascular: Regular rate and rhythm. Loud, harsh systolic murmur.  Abdomen:Soft. Bowel sounds present. Non-tender.  Extremities: No lower extremity edema. Pulses are 2 + in the bilateral DP/PT.  EKG:  EKG is not ordered today. The ekg ordered today  demonstrates   Echo July 2021:  1. Normal LV systolic function; grade 1 DD; mildly dilated aortic root;  severe AS (peak velocity 4.3 m/s; mean gradient 36 mmHg).  2. Left ventricular ejection fraction, by estimation, is 60 to 65%. The  left ventricle has normal function. The left ventricle has no regional  wall motion abnormalities. Left ventricular diastolic parameters are  consistent with Grade I diastolic  dysfunction (impaired relaxation). Elevated left atrial pressure.  3. Right ventricular systolic function is normal. The right ventricular  size is normal.  4. The mitral valve is normal in structure. Trivial mitral valve  regurgitation. No evidence of mitral stenosis.  5. The aortic valve has an indeterminant number of cusps. Aortic valve  regurgitation is not visualized. Severe aortic valve stenosis.  6. Aortic dilatation noted. There is mild dilatation of the aortic root  measuring 38 mm.  7. The inferior vena cava is normal in size with greater than 50%  respiratory variability, suggesting right atrial pressure of 3 mmHg.   Comparison(s): 01/13/19 EF 60-65%. Severe AS.   FINDINGS  Left Ventricle: Left ventricular ejection fraction, by estimation, is 60  to 65%. The left ventricle has normal function. The left ventricle has no  regional wall motion abnormalities. The left ventricular internal cavity  size was normal in size. There is  no left ventricular hypertrophy. Left ventricular diastolic parameters  are consistent with Grade I diastolic dysfunction (impaired relaxation).  Elevated left atrial pressure.   Right Ventricle: The right ventricular size is normal.Right ventricular  systolic function is normal.   Left Atrium: Left atrial size was normal in size.   Right Atrium: Right atrial size was normal in size.   Pericardium: There is no evidence of pericardial effusion.   Mitral Valve: The mitral valve is normal in structure. Normal mobility of  the  mitral valve leaflets. Trivial mitral valve regurgitation. No evidence  of mitral valve stenosis.   Tricuspid Valve: The tricuspid valve is normal in structure. Tricuspid  valve regurgitation is  trivial. No evidence of tricuspid stenosis.   Aortic Valve: The aortic valve has an indeterminant number of cusps.  Aortic valve regurgitation is not visualized. Severe aortic stenosis is  present. Aortic valve mean gradient measures 36.0 mmHg. Aortic valve peak  gradient measures 73.6 mmHg. Aortic  valve area, by VTI measures 0.86 cm.   Pulmonic Valve: The pulmonic valve was normal in structure. Pulmonic valve  regurgitation is trivial. No evidence of pulmonic stenosis.   Aorta: Aortic dilatation noted. There is mild dilatation of the aortic  root measuring 38 mm.   Venous: The inferior vena cava is normal in size with greater than 50%  respiratory variability, suggesting right atrial pressure of 3 mmHg.   IAS/Shunts: The interatrial septum is aneurysmal. No atrial level shunt  detected by color flow Doppler.   Additional Comments: Normal LV systolic function; grade 1 DD; mildly  dilated aortic root; severe AS (peak velocity 4.3 m/s; mean gradient 36  mmHg).     LEFT VENTRICLE  PLAX 2D  LVIDd:     4.20 cm Diastology  LVIDs:     2.80 cm LV e' lateral:  6.00 cm/s  LV PW:     1.00 cm LV E/e' lateral: 15.1  LV IVS:    0.90 cm LV e' medial:  4.95 cm/s  LVOT diam:   2.00 cm LV E/e' medial: 18.3  LV SV:     82  LV SV Index:  41    2D Longitudinal Strain  LVOT Area:   3.14 cm 2D Strain GLS (A2C):  -27.5 %             2D Strain GLS (A3C):  -23.0 %             2D Strain GLS (A4C):  16.6 %             2D Strain GLS Avg:   -22.4 %               3D Volume EF:             3D EF:    58 %             LV EDV:    116 ml             LV ESV:    49 ml              LV SV:    67 ml   RIGHT VENTRICLE  RV Basal diam: 2.80 cm  RV S prime:   10.70 cm/s  TAPSE (M-mode): 2.2 cm   LEFT ATRIUM       Index    RIGHT ATRIUM      Index  LA diam:    3.90 cm 1.93 cm/m RA Area:   11.40 cm  LA Vol (A2C):  62.6 ml 30.99 ml/m RA Volume:  22.20 ml 10.99 ml/m  LA Vol (A4C):  49.9 ml 24.70 ml/m  LA Biplane Vol: 56.0 ml 27.72 ml/m  AORTIC VALVE  AV Area (Vmax):  0.75 cm  AV Area (Vmean):  0.77 cm  AV Area (VTI):   0.86 cm  AV Vmax:      429.00 cm/s  AV Vmean:     276.000 cm/s  AV VTI:      0.958 m  AV Peak Grad:   73.6 mmHg  AV Mean Grad:   36.0 mmHg  LVOT Vmax:     102.00 cm/s  LVOT Vmean:  67.600 cm/s  LVOT VTI:     0.262 m  LVOT/AV VTI ratio: 0.27    AORTA  Ao Root diam: 3.80 cm  Ao Asc diam: 3.70 cm   MITRAL VALVE               SHUNTS               Systemic VTI: 0.26 m  MV E velocity: 90.80 cm/s  Systemic Diam: 2.00 cm  MV A velocity: 114.00 cm/s  MV E/A ratio: 0.80   Cardiac cath 02/02/19:  Significant calcific aortic stenosis that by Cath Lab calculations suggest moderate hemodynamic impairment.  Peak to peak gradient approximately 50 mmHg with a calculated valve area of 1.28 cm.  Echo data suggests a transvalvular velocity greater than 4 m/s and demonstrated a lower calculated aortic valve area.  Left main is widely patent  LAD contains a mid first generation nondrug-eluting stent.  The stent is widely patent.  There is moderate diffuse disease in the ostial to mid LAD less than 40%.  Circumflex artery contains a proximal stent that extends into the large first obtuse marginal and is widely patent.  The jailed continuation of the circumflex is widely patent giving origin to 2 small obtuse marginals.  RCA contains diffuse luminal irregularities with distal 50 to 70% narrowing.  Not intervened upon as the  patient is totally asymptomatic with reference to angina complaints.  Normal right heart pressures.  Recent Labs: 02/08/2019: ALT 18; BUN 29; Creatinine 1.67; Hemoglobin 13.0; Platelets 223; Potassium 4.6; Sodium 140    Wt Readings from Last 3 Encounters:  09/02/19 183 lb (83 kg)  05/05/19 185 lb 1.9 oz (84 kg)  02/08/19 191 lb 9.6 oz (86.9 kg)     Other studies Reviewed: Additional studies/ records that were reviewed today include: echo images, cath images. Review of the above records demonstrates: severe AS   Assessment and Plan:   1. Severe Aortic Valve Stenosis: He has stage C1 severe aortic stenosis. I have personally reviewed the echo images. The aortic valve is thickened, calcified with limited leaflet mobility. I think he would benefit from AVR when he becomes symptomatic. Given advanced age, he is not a good candidate for conventional AVR by surgical approach. I think he may be a good candidate for TAVR.   I have reviewed the natural history of aortic stenosis with the patient and their family members  who are present today. We have discussed the limitations of medical therapy and the poor prognosis associated with symptomatic aortic stenosis. We have reviewed potential treatment options, including palliative medical therapy, conventional surgical aortic valve replacement, and transcatheter aortic valve replacement. We discussed treatment options in the context of the patient's specific comorbid medical conditions.   I will see him back in six months and repeat an echo before that visit. He will call with any onset of symptoms suggestive of progression of his aortic stenosis.    Current medicines are reviewed at length with the patient today.  The patient does not have concerns regarding medicines.  The following changes have been made:  no change  Labs/ tests ordered today include:   Orders Placed This Encounter  Procedures  . ECHOCARDIOGRAM COMPLETE     Disposition:    F/U with me  In 6 months with an echo.    Signed, Lauree Chandler, MD 09/02/2019 9:11 AM    [address], [place], [place]  [id number] Phone: [phone])  [phone]; Fax: [phone]

## 2019-09-02 ENCOUNTER — Encounter: Payer: Self-pay | Admitting: Cardiovascular Disease

## 2019-09-02 ENCOUNTER — Ambulatory Visit (INDEPENDENT_AMBULATORY_CARE_PROVIDER_SITE_OTHER): Payer: Medicare Other | Admitting: Cardiovascular Disease

## 2019-09-02 ENCOUNTER — Other Ambulatory Visit: Payer: Self-pay

## 2019-09-02 VITALS — BP 122/70 | HR 78 | Ht 70.0 in | Wt 183.0 lb

## 2019-09-02 DIAGNOSIS — Z9861 Coronary angioplasty status: Secondary | ICD-10-CM | POA: Diagnosis not present

## 2019-09-02 DIAGNOSIS — I350 Nonrheumatic aortic (valve) stenosis: Secondary | ICD-10-CM | POA: Diagnosis not present

## 2019-09-02 DIAGNOSIS — I2510 Atherosclerotic heart disease of native coronary artery without angina pectoris: Secondary | ICD-10-CM

## 2019-09-02 NOTE — Patient Instructions (Addendum)
Medication Instructions:  No changes *If you need a refill on your cardiac medications before your next appointment, please call your pharmacy*   Lab Work: none If you have labs (blood work) drawn today and your tests are completely normal, you will receive your results only by: Marland Kitchen MyChart Message (if you have MyChart) OR . A paper copy in the mail If you have any lab test that is abnormal or we need to change your treatment, we will call you to review the results.   Testing/Procedures:  ECHO IN January 2022 Your physician has requested that you have an echocardiogram. Echocardiography is a painless test that uses sound waves to create images of your heart. It provides your doctor with information about the size and shape of your heart and how well your heart's chambers and valves are working. This procedure takes approximately one hour. There are no restrictions for this procedure.   Follow-Up: At [facility], you and your health needs are our priority.  As part of our continuing mission to provide you with exceptional heart care, we have created designated Provider Care Teams.  These Care Teams include your primary Cardiologist (physician) and Advanced Practice Providers (APPs -  Physician Assistants and Nurse Practitioners) who all work together to provide you with the care you need, when you need it.  Your next appointment:   6 month(s)  The format for your next appointment:   In Person  Provider:   Lauree Chandler, MD   Other Instructions

## 2019-09-03 ENCOUNTER — Ambulatory Visit: Payer: Self-pay

## 2019-09-06 DIAGNOSIS — G5600 Carpal tunnel syndrome, unspecified upper limb: Secondary | ICD-10-CM | POA: Diagnosis not present

## 2019-09-06 DIAGNOSIS — I350 Nonrheumatic aortic (valve) stenosis: Secondary | ICD-10-CM | POA: Diagnosis not present

## 2019-09-06 DIAGNOSIS — N1832 Chronic kidney disease, stage 3b: Secondary | ICD-10-CM | POA: Diagnosis not present

## 2019-09-06 DIAGNOSIS — E1121 Type 2 diabetes mellitus with diabetic nephropathy: Secondary | ICD-10-CM | POA: Diagnosis not present

## 2019-09-06 DIAGNOSIS — R351 Nocturia: Secondary | ICD-10-CM | POA: Diagnosis not present

## 2019-09-06 DIAGNOSIS — M5412 Radiculopathy, cervical region: Secondary | ICD-10-CM | POA: Diagnosis not present

## 2019-09-06 DIAGNOSIS — D649 Anemia, unspecified: Secondary | ICD-10-CM | POA: Diagnosis not present

## 2019-09-06 DIAGNOSIS — N401 Enlarged prostate with lower urinary tract symptoms: Secondary | ICD-10-CM | POA: Diagnosis not present

## 2019-09-06 DIAGNOSIS — C9110 Chronic lymphocytic leukemia of B-cell type not having achieved remission: Secondary | ICD-10-CM | POA: Diagnosis not present

## 2019-09-06 DIAGNOSIS — I2510 Atherosclerotic heart disease of native coronary artery without angina pectoris: Secondary | ICD-10-CM | POA: Diagnosis not present

## 2019-09-06 DIAGNOSIS — N3943 Post-void dribbling: Secondary | ICD-10-CM | POA: Diagnosis not present

## 2019-09-06 DIAGNOSIS — E785 Hyperlipidemia, unspecified: Secondary | ICD-10-CM | POA: Diagnosis not present

## 2019-09-06 DIAGNOSIS — I129 Hypertensive chronic kidney disease with stage 1 through stage 4 chronic kidney disease, or unspecified chronic kidney disease: Secondary | ICD-10-CM | POA: Diagnosis not present

## 2019-09-06 DIAGNOSIS — R972 Elevated prostate specific antigen [PSA]: Secondary | ICD-10-CM | POA: Diagnosis not present

## 2019-09-22 ENCOUNTER — Encounter: Payer: Self-pay | Admitting: Dermatology

## 2019-09-22 NOTE — Progress Notes (Signed)
   Follow-Up Visit   Subjective  Barry Zavala is a 79 y.o. male who presents for the following: Skin Problem (concerning spots incluce nose, present less than a month, back of head with scaling, white spot on left wrist and calf of left leg). Crust Location: Nose Duration:  Quality:  Associated Signs/Symptoms: Modifying Factors:  Severity:  Timing: Context: History of non-melanoma skin cancer  Objective  Well appearing patient in no apparent distress; mood and affect are within normal limits.  A full examination was performed including scalp, head, eyes, ears, nose, lips, neck, chest, axillae, abdomen, back, buttocks, bilateral upper extremities, bilateral lower extremities, hands, feet, fingers, toes, fingernails, and toenails. All findings within normal limits unless otherwise noted below.   Assessment & Plan    AK (actinic keratosis) Left Nasal Sidewall  Destruction of lesion - Left Nasal Sidewall Complexity: simple   Destruction method: cryotherapy   Informed consent: discussed and consent obtained   Timeout:  patient name, date of birth, surgical site, and procedure verified Lesion destroyed using liquid nitrogen: Yes   Region frozen until ice ball extended beyond lesion: Yes   Cryotherapy cycles:  3 Outcome: patient tolerated procedure well with no complications    Seborrheic keratosis (2) Left Upper Back; Right Upper Back  Leave if stable  Nevus Mid Back  Annual skin examination.  Routine follow-up for Barry Zavala who is a remarkably young looking 79 year old man date of birth 1940-10-08 (happy 79th birthday in 30 months Jontavious).  All skin examined on legs and waist up today there are no atypical moles or skin cancer.  A small horn on the left side of the nose may be a solar keratosis and was treated with 5-second close freeze.  This will swell and may peel in the next 2 weeks.  There is no special care, but if this fails to respond we will consider  future biopsy. Wife  Barry Zavala (present in room) was shown her husband's back where he has multiple keratoses which currently require no treatment.  The keratoses on the left arm are sun exposed but clinically benign and also can be left if stable.  There is a small sebaceous hyperplasia on the right back cheek/sideburn area which does not currently require any treatment.  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  Should these persist for months, I do want him to return.   I, Lavonna Monarch, MD, have reviewed all documentation for this visit.  The documentation on 09/22/19 for the exam, diagnosis, procedures, and orders are all accurate and complete.

## 2019-10-06 DIAGNOSIS — Z23 Encounter for immunization: Secondary | ICD-10-CM | POA: Diagnosis not present

## 2019-11-01 NOTE — Progress Notes (Signed)
Cardiology Office Note:    Date:  11/03/2019   ID:  Barry Zavala, DOB 05/18/1940, MRN [id number]  PCP:  Josetta Huddle, MD  Cardiologist:  Sinclair Grooms, MD   Referring MD: Josetta Huddle, MD   Chief Complaint  Patient presents with  . Cardiac Valve Problem  . Coronary Artery Disease    History of Present Illness:    Ralf Konopka is a 79 y.o. male with a hx of remote MI in 1995, and PCI in 2001. Myoview in Feb 2017 was low risk,NIDDM, HTN, HLD, CRI,severe aortic stenosis,and RBBB.Being followed in the [facility] by Dr. Angelena Form.  Over the past 6 weeks, Mr. Dia Sitter is noted a burning sensation in his chest when he is walking up an incline.  This is a new sensation.  There is also some associated shortness of breath.  If he stops more up to a minute the sensation goes away.  There is no radiation to his neck or arms.  It is dissimilar to prior angina that he has had.  He is here today because this is a routine appointment that was previously sent.  He would not be here based upon the symptoms.  He denies orthopnea, PND, lower extremity swelling, palpitations, and nitroglycerin use.  Past Medical History:  Diagnosis Date  . Anemia   . Aortic stenosis, moderate   . Arthritis    hands  . Coronary artery disease   . Elevated PSA   . Erectile dysfunction   . GERD (gastroesophageal reflux disease)   . Hyperlipidemia   . Hypertension   . Irritable bowel syndrome   . Obesity   . Onychomycosis   . Palmar fibromatosis   . Right bundle branch block   . 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 Type 2 diabetes mellitus ([facility])     Past Surgical History:  Procedure Laterality Date  . APPENDECTOMY    . cardiac stents    . CARPAL TUNNEL RELEASE Left 12/09/2016   Procedure: CARPAL TUNNEL RELEASE;  Surgeon: Daryll Brod, MD;  Location: [facility];  Service: Orthopedics;  Laterality: Left;  . CARPAL TUNNEL WITH  CUBITAL TUNNEL Right 01/13/2017   Procedure: RIGHT CARPAL TUNNEL WITH CUBITAL TUNNEL RELEASE;  Surgeon: Daryll Brod, MD;  Location: [facility];  Service: Orthopedics;  Laterality: Right;  . RIGHT/LEFT HEART CATH AND CORONARY ANGIOGRAPHY N/A 02/02/2019   Procedure: RIGHT/LEFT HEART CATH AND CORONARY ANGIOGRAPHY;  Surgeon: Belva Crome, MD;  Location: [facility];  Service: Cardiovascular;  Laterality: N/A;  . TRIGGER FINGER RELEASE Left 08/10/2018   Procedure: RELEASE TRIGGER FINGER/A-1 PULLEY THUMB AND LEFT SMALL;  Surgeon: Daryll Brod, MD;  Location: [facility];  Service: Orthopedics;  Laterality: Left;  FAB  . ULNAR NERVE TRANSPOSITION Left 12/09/2016   Procedure: DECOMPRESSION LEFT ULNAR NERVE;  Surgeon: Daryll Brod, MD;  Location: [facility];  Service: Orthopedics;  Laterality: Left;  . ULNAR NERVE TRANSPOSITION Right 01/13/2017   Procedure: DECOMPRESSION ULNAR NERVE;  Surgeon: Daryll Brod, MD;  Location: [facility];  Service: Orthopedics;  Laterality: Right;    Current Medications: Current Meds  Medication Sig  . ACTOS 45 MG tablet Take 45 mg by mouth daily with supper.   Marland Kitchen aspirin EC 81 MG tablet Take 1 tablet (81 mg total) by mouth daily.  . Cholecalciferol (VITAMIN D3 SUPER STRENGTH) 50 MCG (2000 UT) TABS Take 2,000  Units by mouth daily.  . Empagliflozin (JARDIANCE) 10 MG TABS Take 10 mg by mouth daily.   . Glucosamine-Chondroitin (GLUCOSAMINE CHONDR COMPLEX PO) Take 1 capsule by mouth 2 (two) times daily.  Marland Kitchen glyBURIDE-metformin (GLUCOVANCE) 5-500 MG per tablet Take 2 tablets by mouth 2 (two) times daily with a meal.  . metoprolol succinate (TOPROL-XL) 50 MG 24 hr tablet Take 50 mg by mouth at bedtime. Take with or immediately following a meal.   . Multiple Vitamin (MULTIVITAMIN WITH MINERALS) TABS tablet Take 1 tablet by mouth daily. Centrum silver  . omega-3 acid ethyl esters (LOVAZA) 1 G capsule Take 1 g by mouth 3 (three)  times daily.  Marland Kitchen omeprazole (PRILOSEC) 20 MG capsule Take 20 mg by mouth 2 (two) times a week. Mondays & Thursdays.  . ONGLYZA 5 MG TABS tablet Take 5 mg by mouth daily with supper.   . ramipril (ALTACE) 10 MG capsule Take 10 mg by mouth daily with supper.   Marland Kitchen VYTORIN 10-20 MG tablet TAKE 1 TABLET BY MOUTH DAILY.     Allergies:   Other   Social History   Socioeconomic History  . Marital status: Married    Spouse name: Not on file  . Number of children: 2  . Years of education: Not on file  . Highest education level: Not on file  Occupational History  . Occupation: Retired Chief Executive Officer  Tobacco Use  . Smoking status: Former Smoker    Packs/day: 1.00    Years: 20.00    Pack years: 20.00    Types: Cigarettes    Quit date: 1988    Years since quitting: 33.8  . Smokeless tobacco: Never Used  Vaping Use  . Vaping Use: Never used  Substance and Sexual Activity  . Alcohol use: Yes    Alcohol/week: 0.0 standard drinks    Comment: social  . Drug use: No  . Sexual activity: Not on file  Other Topics Concern  . Not on file  Social History Narrative  . Not on file   Social Determinants of Health   Financial Resource Strain:   . Difficulty of Paying Living Expenses: Not on file  Food Insecurity:   . Worried About Charity fundraiser in the Last Year: Not on file  . Ran Out of Food in the Last Year: Not on file  Transportation Needs:   . Lack of Transportation (Medical): Not on file  . Lack of Transportation (Non-Medical): Not on file  Physical Activity:   . Days of Exercise per Week: Not on file  . Minutes of Exercise per Session: Not on file  Stress:   . Feeling of Stress : Not on file  Social Connections:   . Frequency of Communication with Friends and Family: Not on file  . Frequency of Social Gatherings with Friends and Family: Not on file  . Attends Religious Services: Not on file  . Active Member of Clubs or Organizations: Not on file  . Attends Archivist  Meetings: Not on file  . Marital Status: Not on file     Family History: The patient's family history includes Cancer in his paternal uncle; Heart failure in his father; Multiple myeloma (age of onset: 29) in his sister; Ovarian cancer (age of onset: 79) in his mother.  ROS:   Please see the history of present illness.    No new complaints.  He does state that the difficulty with walking up an incline is a problem for him  since he loves playing golf and this is now getting in the way.  All other systems reviewed and are negative.  EKGs/Labs/Other Studies Reviewed:    The following studies were reviewed today:  Cardiac cath in January 2021 demonstrated widely patent LAD stent, moderate 50% disease beyond the stent in the LAD, and a distal 50 to 70% RCA stenosis.  EKG:  EKG an EKG is not performed today.  Recent Labs: 02/08/2019: ALT 18; BUN 29; Creatinine 1.67; Hemoglobin 13.0; Platelets 223; Potassium 4.6; Sodium 140  Recent Lipid Panel    Component Value Date/Time   CHOL 83 (L) 01/24/2015 0949   TRIG 49 01/24/2015 0949   HDL 38 (L) 01/24/2015 0949   CHOLHDL 2.2 01/24/2015 0949   VLDL 10 01/24/2015 0949   LDLCALC 35 01/24/2015 0949    Physical Exam:    VS:  BP 118/62   Pulse 78   Ht 5\' 10"  (1.778 m)   Wt 188 lb 3.2 oz (85.4 kg)   SpO2 96%   BMI 27.00 kg/m     Wt Readings from Last 3 Encounters:  11/03/19 188 lb 3.2 oz (85.4 kg)  09/02/19 183 lb (83 kg)  05/05/19 185 lb 1.9 oz (84 kg)     GEN: Healthy-appearing.  Gaining weight.. No acute distress HEENT: Normal NECK: No JVD. LYMPHATICS: No lymphadenopathy CARDIAC:  RRR 3/6 to 4/6 crescendo decrescendo systolic aortic stenosis murmur without diastolic murmur, gallop, or edema. VASCULAR:  Normal Pulses. No bruits. RESPIRATORY:  Clear to auscultation without rales, wheezing or rhonchi  ABDOMEN: Soft, non-tender, non-distended, No pulsatile mass, MUSCULOSKELETAL: No deformity  SKIN: Warm and dry NEUROLOGIC:  Alert  and oriented x 3 PSYCHIATRIC:  Normal affect   ASSESSMENT:    1. Severe aortic stenosis   2. CAD S/P percutaneous coronary angioplasty   3. Essential hypertension   4. Right bundle branch block   5. Mixed hyperlipidemia   6. Non-insulin treated type 2 diabetes mellitus (H[facility])   7. Stage 3 chronic kidney disease, unspecified whether stage 3a or 3b CKD ([facility])   8. Educated about COVID-19 virus infection    PLAN:    In order of problems listed above:  1. Aortic stenosis, now symptomatic.  I will contact the structural heart team, Dr. 05/07/19 to pull the trigger on beginning to work the patient up for valve replacement via TAVR.  He had relatively recent coronary angiogram that demonstrated patent LAD stent, 40% mid LAD beyond the stent distal margin, and 50 to 70% distal RCA. 2. Continue secondary prevention 3. Blood pressure control based upon today's data is excellent. 4. Not assessed.  EKG not performed today. 5. Target LDL less than 7.  Continue Lovaza and Vytorin 10/20 mg/day..   6. Continue Onglyza, Actos, and Jardiance. 7. Continue Jardiance and Altace. 8. COVID-19 vaccinated and practicing mitigation.   Medication Adjustments/Labs and Tests Ordered: Current medicines are reviewed at length with the patient today.  Concerns regarding medicines are outlined above.  No orders of the defined types were placed in this encounter.  No orders of the defined types were placed in this encounter.   Patient Instructions  Medication Instructions:  Your physician recommends that you continue on your current medications as directed. Please refer to the Current Medication list given to you today.  *If you need a refill on your cardiac medications before your next appointment, please call your pharmacy*   Lab Work: None If you have labs (blood work) drawn today and your tests  are completely normal, you will receive your results only by: Marland Kitchen MyChart Message (if you have MyChart) OR . A  paper copy in the mail If you have any lab test that is abnormal or we need to change your treatment, we will call you to review the results.   Testing/Procedures: None   Follow-Up: Follow up with Dr. Tamala Julian after TAVR   Other Instructions      Signed, Sinclair Grooms, MD  11/03/2019 5:39 PM    [place]

## 2019-11-03 ENCOUNTER — Other Ambulatory Visit: Payer: Self-pay

## 2019-11-03 ENCOUNTER — Encounter: Payer: Self-pay | Admitting: Interventional Cardiology

## 2019-11-03 ENCOUNTER — Ambulatory Visit (INDEPENDENT_AMBULATORY_CARE_PROVIDER_SITE_OTHER): Payer: Medicare Other | Admitting: Interventional Cardiology

## 2019-11-03 VITALS — BP 118/62 | HR 78 | Ht 70.0 in | Wt 188.2 lb

## 2019-11-03 DIAGNOSIS — E119 Type 2 diabetes mellitus without complications: Secondary | ICD-10-CM

## 2019-11-03 DIAGNOSIS — I10 Essential (primary) hypertension: Secondary | ICD-10-CM | POA: Diagnosis not present

## 2019-11-03 DIAGNOSIS — I350 Nonrheumatic aortic (valve) stenosis: Secondary | ICD-10-CM

## 2019-11-03 DIAGNOSIS — I4510 Unspecified right bundle-branch block: Secondary | ICD-10-CM

## 2019-11-03 DIAGNOSIS — I2510 Atherosclerotic heart disease of native coronary artery without angina pectoris: Secondary | ICD-10-CM | POA: Diagnosis not present

## 2019-11-03 DIAGNOSIS — Z7189 Other specified counseling: Secondary | ICD-10-CM | POA: Diagnosis not present

## 2019-11-03 DIAGNOSIS — Z9861 Coronary angioplasty status: Secondary | ICD-10-CM

## 2019-11-03 DIAGNOSIS — E782 Mixed hyperlipidemia: Secondary | ICD-10-CM | POA: Diagnosis not present

## 2019-11-03 DIAGNOSIS — N183 Chronic kidney disease, stage 3 unspecified: Secondary | ICD-10-CM

## 2019-11-03 NOTE — Patient Instructions (Signed)
Medication Instructions:  Your physician recommends that you continue on your current medications as directed. Please refer to the Current Medication list given to you today.  *If you need a refill on your cardiac medications before your next appointment, please call your pharmacy*   Lab Work: None If you have labs (blood work) drawn today and your tests are completely normal, you will receive your results only by:  [place] (if you have MyChart) OR  A paper copy in the mail If you have any lab test that is abnormal or we need to change your treatment, we will call you to review the results.   Testing/Procedures: None   Follow-Up: Follow up with Dr. Tamala Julian after TAVR   Other Instructions

## 2019-11-04 ENCOUNTER — Other Ambulatory Visit: Payer: Self-pay | Admitting: Physician Assistant

## 2019-11-04 ENCOUNTER — Other Ambulatory Visit: Payer: Medicare Other | Admitting: *Deleted

## 2019-11-04 DIAGNOSIS — I350 Nonrheumatic aortic (valve) stenosis: Secondary | ICD-10-CM

## 2019-11-05 LAB — BASIC METABOLIC PANEL
BUN/Creatinine Ratio: 17 (ref 10–24)
BUN: 28 mg/dL — ABNORMAL HIGH (ref 8–27)
CO2: 22 mmol/L (ref 20–29)
Calcium: 9.7 mg/dL (ref 8.6–10.2)
Chloride: 105 mmol/L (ref 96–106)
Creatinine, Ser: 1.66 mg/dL — ABNORMAL HIGH (ref 0.76–1.27)
GFR calc Af Amer: 45 mL/min/{1.73_m2} — ABNORMAL LOW (ref >59)
GFR calc non Af Amer: 39 mL/min/{1.73_m2} — ABNORMAL LOW (ref >59)
Glucose: 173 mg/dL — ABNORMAL HIGH (ref 65–99)
Potassium: 4.5 mmol/L (ref 3.5–5.2)
Sodium: 143 mmol/L (ref 134–144)

## 2019-11-07 ENCOUNTER — Encounter: Payer: Self-pay | Admitting: Physician Assistant

## 2019-11-09 ENCOUNTER — Ambulatory Visit (HOSPITAL_COMMUNITY)
Admission: RE | Admit: 2019-11-09 | Discharge: 2019-11-09 | Disposition: A | Discharge status: Home / Self Care | Payer: Medicare Other | Source: Ambulatory Visit | Attending: Cardiovascular Disease | Admitting: Cardiovascular Disease

## 2019-11-09 ENCOUNTER — Ambulatory Visit (HOSPITAL_COMMUNITY): Payer: Medicare Other

## 2019-11-09 ENCOUNTER — Other Ambulatory Visit: Payer: Self-pay

## 2019-11-09 DIAGNOSIS — I700 Atherosclerosis of aorta: Secondary | ICD-10-CM | POA: Diagnosis not present

## 2019-11-09 DIAGNOSIS — I350 Nonrheumatic aortic (valve) stenosis: Secondary | ICD-10-CM | POA: Diagnosis not present

## 2019-11-09 DIAGNOSIS — Z01818 Encounter for other preprocedural examination: Secondary | ICD-10-CM | POA: Diagnosis not present

## 2019-11-09 LAB — BASIC METABOLIC PANEL
Anion gap: 9 (ref 5–15)
BUN: 34 mg/dL — ABNORMAL HIGH (ref 8–23)
CO2: 22 mmol/L (ref 22–32)
Calcium: 9.4 mg/dL (ref 8.9–10.3)
Chloride: 104 mmol/L (ref 98–111)
Creatinine, Ser: 1.49 mg/dL — ABNORMAL HIGH (ref 0.61–1.24)
GFR, Estimated: 47 mL/min — ABNORMAL LOW (ref >60)
Glucose, Bld: 346 mg/dL — ABNORMAL HIGH (ref 70–99)
Potassium: 4.8 mmol/L (ref 3.5–5.1)
Sodium: 135 mmol/L (ref 135–145)

## 2019-11-09 MED ORDER — SODIUM CHLORIDE 0.9 % WEIGHT BASED INFUSION: 1.0000 mL/kg/h | INTRAVENOUS | Status: DC

## 2019-11-09 MED ORDER — SODIUM CHLORIDE 0.9 % WEIGHT BASED INFUSION
3.0000 mL/kg/h | INTRAVENOUS | Status: AC
  Administered 2019-11-09: 3 mL/kg/h via INTRAVENOUS

## 2019-11-09 MED ORDER — IOHEXOL 350 MG/ML SOLN
100.0000 mL | Freq: Once | INTRAVENOUS | Status: AC | PRN
  Administered 2019-11-09: 100 mL via INTRAVENOUS

## 2019-11-09 NOTE — Progress Notes (Signed)
Notified [place] in [place] of 1 hour IVF will be complete by 0935.

## 2019-11-09 NOTE — H&P (View-Only) (Signed)
Structural Heart Clinic Note  Chief Complaint  Patient presents with  . Follow-up    severe aortic stenosis   History of Present Illness: 79 yo male with history of anemia, arthritis, CAD, GERD, hyperlipidemia, diabetes mellitus, RBBB and severe aortic stenosis who is here today for cardiac follow up. I saw him as a new consult in the valve clinic in February 2021. He is known to have CAD with MI in 1995 and PCI in 2001. Cardiac cath 02/02/19 with patent LAD stent, moderate LAD stenosis, patent Circumflex stent, moderately severe to severe distal RCA stenosis. Echo 01/13/19 with LVEF=60-65%. The aortic valve leaflets are thickened and calcified with limited mobility. Mean gradient 43 mmHg, peak gradient 71.2 mmHg, AVA 0.8 cm2, dimensionless index 0.28. He has chronic kidney disease with baseline creatinine of 1.5. He was asymptomatic when I met him in February 2021 so we did not move forward with planning for TAVR at that time. Echo July 2021 with LVEF=60-65%. The aortic valve leaflets are thickened and calcified with limited leaflet excursion. Mean gradient 36 mmHg, peak gradient 73.6 mmHg, AVA 0.75 cm2. Dimensionless index 0.27. Last visit in my office August 2021 and he remained asymptomatic. He saw Dr. Tamala Julian 11/03/19 and c/o exertional dyspnea and burning in his chest. Gated cardiac CT 11/09/19 with suitable annulus for a 26 mm Edwards Sapien 3 valve with area of 443 mm2. AV calcium score 2964 c/w severe AS. His aortic root was normal size. He has favorable access for transfemoral approach to TAVR.   He is here today for follow up. He tells me that he has had several episodes of central chest burning when walking up hills. No chest pain with minimal exertion. No dyspnea. The patient denies any palpitations, lower extremity edema, orthopnea, PND, dizziness, near syncope or syncope.    He is very active. He plays golf regularly. He lives with his wife in [place]. He sees his dentist regularly and  has no active issues. He is a retired Forensic psychologist.   Primary Care Physician: Josetta Huddle, MD Primary Cardiologist: Daneen Schick Referring Cardiologist: Daneen Schick  Past Medical History:  Diagnosis Date  . Anemia   . Aortic stenosis, moderate   . Arthritis    hands  . Coronary artery disease   . Elevated PSA   . Erectile dysfunction   . GERD (gastroesophageal reflux disease)   . Hyperlipidemia   . Hypertension   . Irritable bowel syndrome   . Obesity   . Onychomycosis   . Palmar fibromatosis   . Right bundle branch block   . 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 [place]     Past Surgical History:  Procedure Laterality Date  . APPENDECTOMY    . cardiac stents    . CARPAL TUNNEL RELEASE Left 12/09/2016   Procedure: CARPAL TUNNEL RELEASE;  Surgeon: KDaryll Brod MD;  [facility] [facility]  Service: Orthopedics;  Laterality: Left;  . CARPAL TUNNEL WITH CUBITAL TUNNEL Right 01/13/2017   Procedure: RIGHT CARPAL TUNNEL WITH CUBITAL TUNNEL RELEASE;  Surgeon: KDaryll Brod MD;  [place] [facility]  Service: Orthopedics;  Laterality: Right;  . RIGHT/LEFT HEART CATH AND CORONARY ANGIOGRAPHY N/A 02/02/2019   Procedure: RIGHT/LEFT HEART CATH AND CORONARY ANGIOGRAPHY;  Surgeon: SBelva Crome MD;  [facility];  Service: Cardiovascular;  Laterality: N/A;  . TRIGGER FINGER RELEASE Left 08/10/2018   Procedure: RELEASE TRIGGER  FINGER/A-1 PULLEY THUMB AND LEFT SMALL;  Surgeon: Daryll Brod, MD;  Location: [facility];  Service: Orthopedics;  Laterality: Left;  FAB  . ULNAR NERVE TRANSPOSITION Left 12/09/2016   Procedure: DECOMPRESSION LEFT ULNAR NERVE;  Surgeon: Daryll Brod, MD;  Location: [facility];  Service: Orthopedics;  Laterality: Left;  . ULNAR NERVE TRANSPOSITION Right 01/13/2017   Procedure: DECOMPRESSION ULNAR NERVE;  Surgeon: Daryll Brod, MD;  Location: [facility];  Service: Orthopedics;  Laterality: Right;    Current Outpatient Medications  Medication Sig Dispense Refill  . ACTOS 45 MG tablet Take 45 mg by mouth daily with supper.   1  . aspirin EC 81 MG tablet Take 1 tablet (81 mg total) by mouth daily. 90 tablet 3  . Cholecalciferol (VITAMIN D3 SUPER STRENGTH) 50 MCG (2000 UT) TABS Take 2,000 Units by mouth daily.    . Empagliflozin (JARDIANCE) 10 MG TABS Take 10 mg by mouth daily.     . Glucosamine-Chondroitin (GLUCOSAMINE CHONDR COMPLEX PO) Take 1 capsule by mouth 2 (two) times daily.    Marland Kitchen glyBURIDE-metformin (GLUCOVANCE) 5-500 MG per tablet Take 2 tablets by mouth 2 (two) times daily with a meal.    . metoprolol succinate (TOPROL-XL) 50 MG 24 hr tablet Take 50 mg by mouth at bedtime. Take with or immediately following a meal.     . Multiple Vitamin (MULTIVITAMIN WITH MINERALS) TABS tablet Take 1 tablet by mouth daily. Centrum silver    . omega-3 acid ethyl esters (LOVAZA) 1 G capsule Take 1 g by mouth 3 (three) times daily.    Marland Kitchen omeprazole (PRILOSEC) 20 MG capsule Take 20 mg by mouth 2 (two) times a week. Mondays & Thursdays.    . ONGLYZA 5 MG TABS tablet Take 5 mg by mouth daily with supper.   2  . ramipril (ALTACE) 10 MG capsule Take 10 mg by mouth daily with supper.     Marland Kitchen VYTORIN 10-20 MG tablet TAKE 1 TABLET BY MOUTH DAILY. 90 tablet 3   No current facility-administered medications for this visit.    Allergies  Allergen Reactions  . Other Other (See Comments)    Agent: Vit C,e,zn,cu-omega3-lut-zeax Reaction: GI upset Agent: Vit C,e,zn,cu-omega3-lut-zeax Reaction: GI upset    Social History   Socioeconomic History  . Marital status: Married    Spouse name: Not on file  . Number of children: 2  . Years of education: Not on file  . Highest education level: Not on file  Occupational History  . Occupation: Retired Chief Executive Officer  Tobacco Use  . Smoking status: Former Smoker    Packs/day: 1.00    Years: 20.00     Pack years: 20.00    Types: Cigarettes    Quit date: 1988    Years since quitting: 33.8  . Smokeless tobacco: Never Used  Vaping Use  . Vaping Use: Never used  Substance and Sexual Activity  . Alcohol use: Yes    Alcohol/week: 0.0 standard drinks    Comment: social  . Drug use: No  . Sexual activity: Not on file  Other Topics Concern  . Not on file  Social History Narrative  . Not on file   Social Determinants of Health   Financial Resource Strain:   . Difficulty of Paying Living Expenses: Not on file  Food Insecurity:   . Worried About Charity fundraiser in the Last Year: Not on file  . Ran Out of Food in the Last  Year: Not on file  Transportation Needs:   . Lack of Transportation (Medical): Not on file  . Lack of Transportation (Non-Medical): Not on file  Physical Activity:   . Days of Exercise per Week: Not on file  . Minutes of Exercise per Session: Not on file  Stress:   . Feeling of Stress : Not on file  Social Connections:   . Frequency of Communication with Friends and Family: Not on file  . Frequency of Social Gatherings with Friends and Family: Not on file  . Attends Religious Services: Not on file  . Active Member of Clubs or Organizations: Not on file  . Attends Archivist Meetings: Not on file  . Marital Status: Not on file  Intimate Partner Violence:   . Fear of Current or Ex-Partner: Not on file  . Emotionally Abused: Not on file  . Physically Abused: Not on file  . Sexually Abused: Not on file    Family History  Problem Relation Age of Onset  . Ovarian cancer Mother 33  . Heart failure Father   . Cancer Paternal Uncle        [facility]i Sarcoma  . Multiple myeloma Sister 49    Review of Systems:  As stated in the HPI and otherwise negative.   BP 120/66   Pulse 69   Ht _0  (1.778 m)   Wt 186 lb (84.4 kg)   SpO2 98%   BMI 26.69 kg/m   Physical Examination:  General: Well developed, well nourished, NAD  HEENT: OP clear, mucus  membranes moist  SKIN: warm, dry. No rashes. Neuro: No focal deficits  Musculoskeletal: Muscle strength 5/5 all ext  Psychiatric: Mood and affect normal  Neck: No JVD, no carotid bruits, no thyromegaly, no lymphadenopathy.  Lungs:Clear bilaterally, no wheezes, rhonci, crackles Cardiovascular: Regular rate and rhythm. Harsh, late peaking systolic murmur noted.  Abdomen:Soft. Bowel sounds present. Non-tender.  Extremities: No lower extremity edema. Pulses are 2 + in the bilateral DP/PT.  EKG:  EKG is not ordered today. The ekg ordered today demonstrates   Echo July 2021:  1. Normal LV systolic function; grade 1 DD; mildly dilated aortic root;  severe AS (peak velocity 4.3 m/s; mean gradient 36 mmHg).  2. Left ventricular ejection fraction, by estimation, is 60 to 65%. The  left ventricle has normal function. The left ventricle has no regional  wall motion abnormalities. Left ventricular diastolic parameters are  consistent with Grade I diastolic  dysfunction (impaired relaxation). Elevated left atrial pressure.  3. Right ventricular systolic function is normal. The right ventricular  size is normal.  4. The mitral valve is normal in structure. Trivial mitral valve  regurgitation. No evidence of mitral stenosis.  5. The aortic valve has an indeterminant number of cusps. Aortic valve  regurgitation is not visualized. Severe aortic valve stenosis.  6. Aortic dilatation noted. There is mild dilatation of the aortic root  measuring 38 mm.  7. The inferior vena cava is normal in size with greater than 50%  respiratory variability, suggesting right atrial pressure of 3 mmHg.   Comparison(s): 01/13/19 EF 60-65%. Severe AS.   FINDINGS  Left Ventricle: Left ventricular ejection fraction, by estimation, is 60  to 65%. The left ventricle has normal function. The left ventricle has no  regional wall motion abnormalities. The left ventricular internal cavity  size was normal in size.  There is  no left ventricular hypertrophy. Left ventricular diastolic parameters  are consistent with Grade I  diastolic dysfunction (impaired relaxation).  Elevated left atrial pressure.   Right Ventricle: The right ventricular size is normal.Right ventricular  systolic function is normal.   Left Atrium: Left atrial size was normal in size.   Right Atrium: Right atrial size was normal in size.   Pericardium: There is no evidence of pericardial effusion.   Mitral Valve: The mitral valve is normal in structure. Normal mobility of  the mitral valve leaflets. Trivial mitral valve regurgitation. No evidence  of mitral valve stenosis.   Tricuspid Valve: The tricuspid valve is normal in structure. Tricuspid  valve regurgitation is trivial. No evidence of tricuspid stenosis.   Aortic Valve: The aortic valve has an indeterminant number of cusps.  Aortic valve regurgitation is not visualized. Severe aortic stenosis is  present. Aortic valve mean gradient measures 36.0 mmHg. Aortic valve peak  gradient measures 73.6 mmHg. Aortic  valve area, by VTI measures 0.86 cm.   Pulmonic Valve: The pulmonic valve was normal in structure. Pulmonic valve  regurgitation is trivial. No evidence of pulmonic stenosis.   Aorta: Aortic dilatation noted. There is mild dilatation of the aortic  root measuring 38 mm.   Venous: The inferior vena cava is normal in size with greater than 50%  respiratory variability, suggesting right atrial pressure of 3 mmHg.   IAS/Shunts: The interatrial septum is aneurysmal. No atrial level shunt  detected by color flow Doppler.   Additional Comments: Normal LV systolic function; grade 1 DD; mildly  dilated aortic root; severe AS (peak velocity 4.3 m/s; mean gradient 36  mmHg).     LEFT VENTRICLE  PLAX 2D  LVIDd:     4.20 cm Diastology  LVIDs:     2.80 cm LV e' lateral:  6.00 cm/s  LV PW:     1.00 cm LV E/e' lateral: 15.1  LV IVS:    0.90 cm  LV e' medial:  4.95 cm/s  LVOT diam:   2.00 cm LV E/e' medial: 18.3  LV SV:     82  LV SV Index:  41    2D Longitudinal Strain  LVOT Area:   3.14 cm 2D Strain GLS (A2C):  -27.5 %             2D Strain GLS (A3C):  -23.0 %             2D Strain GLS (A4C):  16.6 %             2D Strain GLS Avg:   -22.4 %               3D Volume EF:             3D EF:    58 %             LV EDV:    116 ml             LV ESV:    49 ml             LV SV:    67 ml   RIGHT VENTRICLE  RV Basal diam: 2.80 cm  RV S prime:   10.70 cm/s  TAPSE (M-mode): 2.2 cm   LEFT ATRIUM       Index    RIGHT ATRIUM      Index  LA diam:    3.90 cm 1.93 cm/m RA Area:   11.40 cm  LA Vol (A2C):  62.6 ml 30.99 ml/m RA Volume:  22.20 ml  10.99 ml/m  LA Vol (A4C):  49.9 ml 24.70 ml/m  LA Biplane Vol: 56.0 ml 27.72 ml/m  AORTIC VALVE  AV Area (Vmax):  0.75 cm  AV Area (Vmean):  0.77 cm  AV Area (VTI):   0.86 cm  AV Vmax:      429.00 cm/s  AV Vmean:     276.000 cm/s  AV VTI:      0.958 m  AV Peak Grad:   73.6 mmHg  AV Mean Grad:   36.0 mmHg  LVOT Vmax:     102.00 cm/s  LVOT Vmean:    67.600 cm/s  LVOT VTI:     0.262 m  LVOT/AV VTI ratio: 0.27    AORTA  Ao Root diam: 3.80 cm  Ao Asc diam: 3.70 cm   MITRAL VALVE               SHUNTS               Systemic VTI: 0.26 m  MV E velocity: 90.80 cm/s  Systemic Diam: 2.00 cm  MV A velocity: 114.00 cm/s  MV E/A ratio: 0.80   Cardiac cath 02/02/19:  Significant calcific aortic stenosis that by Cath Lab calculations suggest moderate hemodynamic impairment.  Peak to peak gradient approximately 50 mmHg with a calculated valve area of 1.28 cm.  Echo data suggests a transvalvular velocity greater than 4 m/s and demonstrated a lower  calculated aortic valve area.  Left main is widely patent  LAD contains a mid first generation nondrug-eluting stent.  The stent is widely patent.  There is moderate diffuse disease in the ostial to mid LAD less than 40%.  Circumflex artery contains a proximal stent that extends into the large first obtuse marginal and is widely patent.  The jailed continuation of the circumflex is widely patent giving origin to 2 small obtuse marginals.  RCA contains diffuse luminal irregularities with distal 50 to 70% narrowing.  Not intervened upon as the patient is totally asymptomatic with reference to angina complaints.  Normal right heart pressures.  Gated cardiac CT 11/09/19: 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 calcified with restricted motion AV calcium score 2964  Aorta:  Sino-tubular Junction: 25 mm  Ascending Thoracic Aorta: 32 mm  Aortic Arch: 23 mm  Descending Thoracic Aorta: 22 mm  Sinus of Valsalva Measurements:  Non-coronary: 32.7 mm  Right - coronary: 33.4 mm  Left - coronary: 33.8 mm  Coronary Artery Height above Annulus:  Left Main: 12.5 mm above annulus  Right Coronary: 15.8 mm above annulus  Virtual Basal Annulus Measurements:  Maximum/Minimum Diameter: 27.2 mm x 20.5 mm  Perimeter: 78.7 mm  Area: Area 443 mm2  Coronary Arteries: Sufficient height above annulus for deployment  Optimum Fluoroscopic Angle for Delivery: RAO 6 Caudal 16 degrees  IMPRESSION: 1. 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 AV calcium score 2964 consistent with severe AS. Significant annular calcification at base of right and non commissure as well as Calcification of the intervalvular fibrosa  3.  Coronary arteries sufficient height above annulus for deployment  4. Optimum angiographic angle for deployment RAO 5 Caudal 16 degrees  5.  Normal aortic root  3.2 cm  CTA 11/09/19:  Cardiovascular: Heart size is normal. There is no significant pericardial fluid, thickening or pericardial calcification. There is aortic atherosclerosis, as well as atherosclerosis of the great vessels of the mediastinum  and the coronary arteries, including calcified atherosclerotic plaque in the left main, left anterior descending, left circumflex and right coronary arteries. Severe thickening calcification of the aortic valve.  Mediastinum/Lymph Nodes: No pathologically enlarged mediastinal or hilar lymph nodes. Esophagus is unremarkable in appearance. No axillary lymphadenopathy.  Lungs/Pleura: Multiple small pulmonary nodules scattered throughout the lungs bilaterally, largest of which measures only 4 mm in the right lower lobe (axial image 70 of series 8). No other larger more suspicious appearing pulmonary nodules or masses are noted. No acute consolidative airspace disease. No pleural effusions.  Musculoskeletal/Soft Tissues: There are no aggressive appearing lytic or blastic lesions noted in the visualized portions of the skeleton.  CTA ABDOMEN AND PELVIS FINDINGS  Hepatobiliary: 4 mm hypervascular lesion in segment 2 of the liver (axial image 80 of series 7), too small to characterize, but statistically likely to represent a small flash fill cavernous hemangioma or perfusion anomaly. No other larger more suspicious appearing hepatic lesions. No intra or extrahepatic biliary ductal dilatation. Tiny calcified gallstones lying dependently in the neck of the gallbladder. No findings to suggest an acute cholecystitis at this time.  Pancreas: No pancreatic mass. No pancreatic ductal dilatation. No pancreatic or peripancreatic fluid collections or inflammatory changes.  Spleen: Unremarkable.  Adrenals/Urinary Tract: Bilateral kidneys and bilateral adrenal glands are normal in appearance. No hydroureteronephrosis. Urinary bladder is normal  in appearance.  Stomach/Bowel: Normal appearance of the stomach. No pathologic dilatation of small bowel or colon. The appendix is not confidently identified and may be surgically absent. Regardless, there are no inflammatory changes noted adjacent to the cecum to suggest the presence of an acute appendicitis at this time.  Vascular/Lymphatic: Aortic atherosclerosis, with vascular findings and measurements pertinent to potential TAVR procedure, as detailed below. No aneurysm or dissection noted in the abdominal or pelvic vasculature. No lymphadenopathy noted in the abdomen or pelvis.  Reproductive: Prostate gland is severely enlarged with median lobe hypertrophy measuring 6.6 x 6.5 x 6.7 cm. Seminal vesicles are unremarkable in appearance.  Other: No significant volume of ascites.  No pneumoperitoneum.  Musculoskeletal: There are no aggressive appearing lytic or blastic lesions noted in the visualized portions of the skeleton.  VASCULAR MEASUREMENTS PERTINENT TO TAVR:  AORTA:  Minimal Aortic Diameter-12 x 12 mm  Severity of Aortic Calcification-moderate to severe  RIGHT PELVIS:  Right Common Iliac Artery -  Minimal Diameter-10.3 x 9.5 mm  Tortuosity-mild  Calcification-mild  Right External Iliac Artery -  Minimal Diameter-9.3 x 9.1 mm  Tortuosity-moderate  Calcification-none  Right Common Femoral Artery -  Minimal Diameter-8.0 x 7.2 mm  Tortuosity-mild  Calcification-mild  LEFT PELVIS:  Left Common Iliac Artery -  Minimal Diameter-8.7 x 9.3 mm  Tortuosity-mild  Calcification-mild  Left External Iliac Artery -  Minimal Diameter-8.4 x 8.8 mm  Tortuosity-moderate  Calcification-mild  Left Common Femoral Artery -  Minimal Diameter-8.2 x 7.5 mm  Tortuosity-mild  Calcification-mild  Review of the MIP images confirms the above findings.  IMPRESSION: 1. Vascular findings and measurements pertinent to  potential TAVR procedure, as detailed above. 2. Severe thickening calcification of the aortic valve, compatible with reported clinical history of severe aortic stenosis. 3. Aortic atherosclerosis, in addition to left main and 3 vessel coronary artery disease. Assessment for potential risk factor modification, dietary therapy or pharmacologic therapy may be warranted, if clinically indicated. 4. Small pulmonary nodules scattered throughout the lungs bilaterally measuring 4 mm or less in size, nonspecific, but statistically benign. No follow-up needed if patient is low-risk (and  has no known or suspected primary neoplasm). Non-contrast chest CT can be considered in 12 months if patient is high-risk. This recommendation follows the consensus statement: Guidelines for Management of Incidental Pulmonary Nodules Detected on CT Images: From the Fleischner Society 2017; Radiology 2017; 284:228-243. 5. 4 mm hypervascular lesion in segment 2 of the liver, nonspecific, but strongly favored to represent a benign lesions such as a small flash fill cavernous hemangioma. This could be definitively characterized with follow-up nonemergent abdominal MRI with and without IV gadolinium if of clinical concern. 6. Cholelithiasis without evidence of acute cholecystitis at this time. 7. Prostatomegaly with median lobe hypertrophy. 8. Additional incidental findings, as above.   Recent Labs: 02/08/2019: ALT 18; Hemoglobin 13.0; Platelets 223 11/09/2019: BUN 34; Creatinine, Ser 1.49; Potassium 4.8; Sodium 135    Wt Readings from Last 3 Encounters:  11/10/19 186 lb (84.4 kg)  11/03/19 188 lb 3.2 oz (85.4 kg)  09/02/19 183 lb (83 kg)    Assessment and Plan:   1. Severe Aortic Valve Stenosis: He has severe, stage D aortic valve stenosis. I have personally reviewed the echo images. The aortic valve is thickened, calcified with limited leaflet mobility. I think he would benefit from AVR. Given advanced age, he  is not a good candidate for conventional AVR by surgical approach. I think he may be a good candidate for TAVR. See below. Will repeat cardiac cath prior to TAVR and will likely place a stent in the distal RCA lesion.   STS Risk Score:  Risk of Mortality: 1.208% Renal Failure: 1.698% Permanent Stroke: 1.127% Prolonged Ventilation: 4.979% DSW Infection: 0.083% Reoperation: 3.707% Morbidity or Mortality: 8.754% Short Length of Stay: 47.025% Long Length of Stay: 3.740%  I have reviewed the natural history of aortic stenosis with the patient and their family members  who are present today. We have discussed the limitations of medical therapy and the poor prognosis associated with symptomatic aortic stenosis. We have reviewed potential treatment options, including palliative medical therapy, conventional surgical aortic valve replacement, and transcatheter aortic valve replacement. We discussed treatment options in the context of the patient's specific comorbid medical conditions.   He would like to proceed with planning for TAVR. I think it is necessary to repeat his cardiac cath given the moderately severe distal RCA stenosis by cath in January 2021 and his anginal type symptoms. Risks and benefits of the cath procedure and the valve procedure reviewed with the patient. His scans are complete. Will arrange carotid artery dopplers now and he will be referred to see one of the CT surgeons on our TAVR team.   2. CAD with unstable angina: He is having chest pain with moderate exertion. Cardiac cath from January 2021 reviewed today. The distal RCA lesion was borderline at that time and was managed medically due his lack of symptoms. Now that he is having angina type pain, will proceed with repeat cardiac cath and likely stenting of the distal RCA prior to TAVR.  I have reviewed the risks, indications, and alternatives to cardiac catheterization, possible angioplasty, and stenting with the patient.  Risks include but are not limited to bleeding, infection, vascular injury, stroke, myocardial infection, arrhythmia, kidney injury, radiation-related injury in the case of prolonged fluoroscopy use, emergency cardiac surgery, and death. The patient understands the risks of serious complication is 1-2 in 5170 with diagnostic cardiac cath and 1-2% or less with angioplasty/stenting. -Continue ASA, beta blocker and Vytorin.   Current medicines are reviewed at length with the patient today.  The patient does not have concerns regarding medicines.  The following changes have been made:  no change  Labs/ tests ordered today include:   No orders of the defined types were placed in this encounter.    Disposition:   F/U with the valve team   Signed, Lauree Chandler, MD 11/10/2019 9:52 AM    [address], [place], [place]  [address] Phone: [phone]; Fax: [phone]

## 2019-11-09 NOTE — Progress Notes (Signed)
  Structural Heart Clinic Note  Chief Complaint  Patient presents with  . Follow-up    severe aortic stenosis   History of Present Illness: 78 yo male with history of anemia, arthritis, CAD, GERD, hyperlipidemia, diabetes mellitus, RBBB and severe aortic stenosis who is here today for cardiac follow up. I saw him as a new consult in the valve clinic in February 2021. He is known to have CAD with MI in 1995 and PCI in 2001. Cardiac cath 02/02/19 with patent LAD stent, moderate LAD stenosis, patent Circumflex stent, moderately severe to severe distal RCA stenosis. Echo 01/13/19 with LVEF=60-65%. The aortic valve leaflets are thickened and calcified with limited mobility. Mean gradient 43 mmHg, peak gradient 71.2 mmHg, AVA 0.8 cm2, dimensionless index 0.28. He has chronic kidney disease with baseline creatinine of 1.5. He was asymptomatic when I met him in February 2021 so we did not move forward with planning for TAVR at that time. Echo July 2021 with LVEF=60-65%. The aortic valve leaflets are thickened and calcified with limited leaflet excursion. Mean gradient 36 mmHg, peak gradient 73.6 mmHg, AVA 0.75 cm2. Dimensionless index 0.27. Last visit in my office August 2021 and he remained asymptomatic. He saw Dr. Smith 11/03/19 and c/o exertional dyspnea and burning in his chest. Gated cardiac CT 11/09/19 with suitable annulus for a 26 mm Edwards Sapien 3 valve with area of 443 mm2. AV calcium score 2964 c/w severe AS. His aortic root was normal size. He has favorable access for transfemoral approach to TAVR.   He is here today for follow up. He tells me that he has had several episodes of central chest burning when walking up hills. No chest pain with minimal exertion. No dyspnea. The patient denies any palpitations, lower extremity edema, orthopnea, PND, dizziness, near syncope or syncope.    He is very active. He plays golf regularly. He lives with his wife in [place]. He sees his dentist regularly and  has no active issues. He is a retired attorney.   Primary Care Physician: Gates, Robert, MD Primary Cardiologist: Henry Smith Referring Cardiologist: Henry Smith  Past Medical History:  Diagnosis Date  . Anemia   . Aortic stenosis, moderate   . Arthritis    hands  . Coronary artery disease   . Elevated PSA   . Erectile dysfunction   . GERD (gastroesophageal reflux disease)   . Hyperlipidemia   . Hypertension   . Irritable bowel syndrome   . Obesity   . Onychomycosis   . Palmar fibromatosis   . Right bundle branch block   . 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 Type 2 diabetes mellitus (HCC)     Past Surgical History:  Procedure Laterality Date  . APPENDECTOMY    . cardiac stents    . CARPAL TUNNEL RELEASE Left 12/09/2016   Procedure: CARPAL TUNNEL RELEASE;  Surgeon: Kuzma, Gary, MD;  Location: [facility];  Service: Orthopedics;  Laterality: Left;  . CARPAL TUNNEL WITH CUBITAL TUNNEL Right 01/13/2017   Procedure: RIGHT CARPAL TUNNEL WITH CUBITAL TUNNEL RELEASE;  Surgeon: Kuzma, Gary, MD;  Location: [facility];  Service: Orthopedics;  Laterality: Right;  . RIGHT/LEFT HEART CATH AND CORONARY ANGIOGRAPHY N/A 02/02/2019   Procedure: RIGHT/LEFT HEART CATH AND CORONARY ANGIOGRAPHY;  Surgeon: Smith, Henry W, MD;  Location: [facility];  Service: Cardiovascular;  Laterality: N/A;  . TRIGGER FINGER RELEASE Left 08/10/2018   Procedure: RELEASE TRIGGER   FINGER/A-1 PULLEY THUMB AND LEFT SMALL;  Surgeon: Kuzma, Gary, MD;  Location: [facility];  Service: Orthopedics;  Laterality: Left;  FAB  . ULNAR NERVE TRANSPOSITION Left 12/09/2016   Procedure: DECOMPRESSION LEFT ULNAR NERVE;  Surgeon: Kuzma, Gary, MD;  Location: [facility];  Service: Orthopedics;  Laterality: Left;  . ULNAR NERVE TRANSPOSITION Right 01/13/2017   Procedure: DECOMPRESSION ULNAR NERVE;  Surgeon: Kuzma, Gary, MD;  Location: [facility]  ;  Service: Orthopedics;  Laterality: Right;    Current Outpatient Medications  Medication Sig Dispense Refill  . ACTOS 45 MG tablet Take 45 mg by mouth daily with supper.   1  . aspirin EC 81 MG tablet Take 1 tablet (81 mg total) by mouth daily. 90 tablet 3  . Cholecalciferol (VITAMIN D3 SUPER STRENGTH) 50 MCG (2000 UT) TABS Take 2,000 Units by mouth daily.    . Empagliflozin (JARDIANCE) 10 MG TABS Take 10 mg by mouth daily.     . Glucosamine-Chondroitin (GLUCOSAMINE CHONDR COMPLEX PO) Take 1 capsule by mouth 2 (two) times daily.    . glyBURIDE-metformin (GLUCOVANCE) 5-500 MG per tablet Take 2 tablets by mouth 2 (two) times daily with a meal.    . metoprolol succinate (TOPROL-XL) 50 MG 24 hr tablet Take 50 mg by mouth at bedtime. Take with or immediately following a meal.     . Multiple Vitamin (MULTIVITAMIN WITH MINERALS) TABS tablet Take 1 tablet by mouth daily. Centrum silver    . omega-3 acid ethyl esters (LOVAZA) 1 G capsule Take 1 g by mouth 3 (three) times daily.    . omeprazole (PRILOSEC) 20 MG capsule Take 20 mg by mouth 2 (two) times a week. Mondays & Thursdays.    . ONGLYZA 5 MG TABS tablet Take 5 mg by mouth daily with supper.   2  . ramipril (ALTACE) 10 MG capsule Take 10 mg by mouth daily with supper.     . VYTORIN 10-20 MG tablet TAKE 1 TABLET BY MOUTH DAILY. 90 tablet 3   No current facility-administered medications for this visit.    Allergies  Allergen Reactions  . Other Other (See Comments)    Agent: Vit C,e,zn,cu-omega3-lut-zeax Reaction: GI upset Agent: Vit C,e,zn,cu-omega3-lut-zeax Reaction: GI upset    Social History   Socioeconomic History  . Marital status: Married    Spouse name: Not on file  . Number of children: 2  . Years of education: Not on file  . Highest education level: Not on file  Occupational History  . Occupation: Retired lawyer  Tobacco Use  . Smoking status: Former Smoker    Packs/day: 1.00    Years: 20.00     Pack years: 20.00    Types: Cigarettes    Quit date: 1988    Years since quitting: 33.8  . Smokeless tobacco: Never Used  Vaping Use  . Vaping Use: Never used  Substance and Sexual Activity  . Alcohol use: Yes    Alcohol/week: 0.0 standard drinks    Comment: social  . Drug use: No  . Sexual activity: Not on file  Other Topics Concern  . Not on file  Social History Narrative  . Not on file   Social Determinants of Health   Financial Resource Strain:   . Difficulty of Paying Living Expenses: Not on file  Food Insecurity:   . Worried About Running Out of Food in the Last Year: Not on file  . Ran Out of Food in the Last   Year: Not on file  Transportation Needs:   . Lack of Transportation (Medical): Not on file  . Lack of Transportation (Non-Medical): Not on file  Physical Activity:   . Days of Exercise per Week: Not on file  . Minutes of Exercise per Session: Not on file  Stress:   . Feeling of Stress : Not on file  Social Connections:   . Frequency of Communication with Friends and Family: Not on file  . Frequency of Social Gatherings with Friends and Family: Not on file  . Attends Religious Services: Not on file  . Active Member of Clubs or Organizations: Not on file  . Attends Club or Organization Meetings: Not on file  . Marital Status: Not on file  Intimate Partner Violence:   . Fear of Current or Ex-Partner: Not on file  . Emotionally Abused: Not on file  . Physically Abused: Not on file  . Sexually Abused: Not on file    Family History  Problem Relation Age of Onset  . Ovarian cancer Mother 63  . Heart failure Father   . Cancer Paternal Uncle        Karposi Sarcoma  . Multiple myeloma Sister 67    Review of Systems:  As stated in the HPI and otherwise negative.   BP 120/66   Pulse 69   Ht 5' 10" (1.778 m)   Wt 186 lb (84.4 kg)   SpO2 98%   BMI 26.69 kg/m   Physical Examination:  General: Well developed, well nourished, NAD  HEENT: OP clear, mucus  membranes moist  SKIN: warm, dry. No rashes. Neuro: No focal deficits  Musculoskeletal: Muscle strength 5/5 all ext  Psychiatric: Mood and affect normal  Neck: No JVD, no carotid bruits, no thyromegaly, no lymphadenopathy.  Lungs:Clear bilaterally, no wheezes, rhonci, crackles Cardiovascular: Regular rate and rhythm. Harsh, late peaking systolic murmur noted.  Abdomen:Soft. Bowel sounds present. Non-tender.  Extremities: No lower extremity edema. Pulses are 2 + in the bilateral DP/PT.  EKG:  EKG is not ordered today. The ekg ordered today demonstrates   Echo July 2021:  1. Normal LV systolic function; grade 1 DD; mildly dilated aortic root;  severe AS (peak velocity 4.3 m/s; mean gradient 36 mmHg).  2. Left ventricular ejection fraction, by estimation, is 60 to 65%. The  left ventricle has normal function. The left ventricle has no regional  wall motion abnormalities. Left ventricular diastolic parameters are  consistent with Grade I diastolic  dysfunction (impaired relaxation). Elevated left atrial pressure.  3. Right ventricular systolic function is normal. The right ventricular  size is normal.  4. The mitral valve is normal in structure. Trivial mitral valve  regurgitation. No evidence of mitral stenosis.  5. The aortic valve has an indeterminant number of cusps. Aortic valve  regurgitation is not visualized. Severe aortic valve stenosis.  6. Aortic dilatation noted. There is mild dilatation of the aortic root  measuring 38 mm.  7. The inferior vena cava is normal in size with greater than 50%  respiratory variability, suggesting right atrial pressure of 3 mmHg.   Comparison(s): 01/13/19 EF 60-65%. Severe AS.   FINDINGS  Left Ventricle: Left ventricular ejection fraction, by estimation, is 60  to 65%. The left ventricle has normal function. The left ventricle has no  regional wall motion abnormalities. The left ventricular internal cavity  size was normal in size.  There is  no left ventricular hypertrophy. Left ventricular diastolic parameters  are consistent with Grade I   diastolic dysfunction (impaired relaxation).  Elevated left atrial pressure.   Right Ventricle: The right ventricular size is normal.Right ventricular  systolic function is normal.   Left Atrium: Left atrial size was normal in size.   Right Atrium: Right atrial size was normal in size.   Pericardium: There is no evidence of pericardial effusion.   Mitral Valve: The mitral valve is normal in structure. Normal mobility of  the mitral valve leaflets. Trivial mitral valve regurgitation. No evidence  of mitral valve stenosis.   Tricuspid Valve: The tricuspid valve is normal in structure. Tricuspid  valve regurgitation is trivial. No evidence of tricuspid stenosis.   Aortic Valve: The aortic valve has an indeterminant number of cusps.  Aortic valve regurgitation is not visualized. Severe aortic stenosis is  present. Aortic valve mean gradient measures 36.0 mmHg. Aortic valve peak  gradient measures 73.6 mmHg. Aortic  valve area, by VTI measures 0.86 cm.   Pulmonic Valve: The pulmonic valve was normal in structure. Pulmonic valve  regurgitation is trivial. No evidence of pulmonic stenosis.   Aorta: Aortic dilatation noted. There is mild dilatation of the aortic  root measuring 38 mm.   Venous: The inferior vena cava is normal in size with greater than 50%  respiratory variability, suggesting right atrial pressure of 3 mmHg.   IAS/Shunts: The interatrial septum is aneurysmal. No atrial level shunt  detected by color flow Doppler.   Additional Comments: Normal LV systolic function; grade 1 DD; mildly  dilated aortic root; severe AS (peak velocity 4.3 m/s; mean gradient 36  mmHg).     LEFT VENTRICLE  PLAX 2D  LVIDd:     4.20 cm Diastology  LVIDs:     2.80 cm LV e' lateral:  6.00 cm/s  LV PW:     1.00 cm LV E/e' lateral: 15.1  LV IVS:    0.90 cm  LV e' medial:  4.95 cm/s  LVOT diam:   2.00 cm LV E/e' medial: 18.3  LV SV:     82  LV SV Index:  41    2D Longitudinal Strain  LVOT Area:   3.14 cm 2D Strain GLS (A2C):  -27.5 %             2D Strain GLS (A3C):  -23.0 %             2D Strain GLS (A4C):  16.6 %             2D Strain GLS Avg:   -22.4 %               3D Volume EF:             3D EF:    58 %             LV EDV:    116 ml             LV ESV:    49 ml             LV SV:    67 ml   RIGHT VENTRICLE  RV Basal diam: 2.80 cm  RV S prime:   10.70 cm/s  TAPSE (M-mode): 2.2 cm   LEFT ATRIUM       Index    RIGHT ATRIUM      Index  LA diam:    3.90 cm 1.93 cm/m RA Area:   11.40 cm  LA Vol (A2C):  62.6 ml 30.99 ml/m RA Volume:  22.20 ml   10.99 ml/m  LA Vol (A4C):  49.9 ml 24.70 ml/m  LA Biplane Vol: 56.0 ml 27.72 ml/m  AORTIC VALVE  AV Area (Vmax):  0.75 cm  AV Area (Vmean):  0.77 cm  AV Area (VTI):   0.86 cm  AV Vmax:      429.00 cm/s  AV Vmean:     276.000 cm/s  AV VTI:      0.958 m  AV Peak Grad:   73.6 mmHg  AV Mean Grad:   36.0 mmHg  LVOT Vmax:     102.00 cm/s  LVOT Vmean:    67.600 cm/s  LVOT VTI:     0.262 m  LVOT/AV VTI ratio: 0.27    AORTA  Ao Root diam: 3.80 cm  Ao Asc diam: 3.70 cm   MITRAL VALVE               SHUNTS               Systemic VTI: 0.26 m  MV E velocity: 90.80 cm/s  Systemic Diam: 2.00 cm  MV A velocity: 114.00 cm/s  MV E/A ratio: 0.80   Cardiac cath 02/02/19:  Significant calcific aortic stenosis that by Cath Lab calculations suggest moderate hemodynamic impairment.  Peak to peak gradient approximately 50 mmHg with a calculated valve area of 1.28 cm.  Echo data suggests a transvalvular velocity greater than 4 m/s and demonstrated a lower  calculated aortic valve area.  Left main is widely patent  LAD contains a mid first generation nondrug-eluting stent.  The stent is widely patent.  There is moderate diffuse disease in the ostial to mid LAD less than 40%.  Circumflex artery contains a proximal stent that extends into the large first obtuse marginal and is widely patent.  The jailed continuation of the circumflex is widely patent giving origin to 2 small obtuse marginals.  RCA contains diffuse luminal irregularities with distal 50 to 70% narrowing.  Not intervened upon as the patient is totally asymptomatic with reference to angina complaints.  Normal right heart pressures.  Gated cardiac CT 11/09/19: 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 calcified with restricted motion AV calcium score 2964  Aorta:  Sino-tubular Junction: 25 mm  Ascending Thoracic Aorta: 32 mm  Aortic Arch: 23 mm  Descending Thoracic Aorta: 22 mm  Sinus of Valsalva Measurements:  Non-coronary: 32.7 mm  Right - coronary: 33.4 mm  Left - coronary: 33.8 mm  Coronary Artery Height above Annulus:  Left Main: 12.5 mm above annulus  Right Coronary: 15.8 mm above annulus  Virtual Basal Annulus Measurements:  Maximum/Minimum Diameter: 27.2 mm x 20.5 mm  Perimeter: 78.7 mm  Area: Area 443 mm2  Coronary Arteries: Sufficient height above annulus for deployment  Optimum Fluoroscopic Angle for Delivery: RAO 6 Caudal 16 degrees  IMPRESSION: 1. 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 AV calcium score 2964 consistent with severe AS. Significant annular calcification at base of right and non commissure as well as Calcification of the intervalvular fibrosa  3.  Coronary arteries sufficient height above annulus for deployment  4. Optimum angiographic angle for deployment RAO 5 Caudal 16 degrees  5.  Normal aortic root  3.2 cm  CTA 11/09/19:  Cardiovascular: Heart size is normal. There is no significant pericardial fluid, thickening or pericardial calcification. There is aortic atherosclerosis, as well as atherosclerosis of the great vessels of the mediastinum   and the coronary arteries, including calcified atherosclerotic plaque in the left main, left anterior descending, left circumflex and right coronary arteries. Severe thickening calcification of the aortic valve.  Mediastinum/Lymph Nodes: No pathologically enlarged mediastinal or hilar lymph nodes. Esophagus is unremarkable in appearance. No axillary lymphadenopathy.  Lungs/Pleura: Multiple small pulmonary nodules scattered throughout the lungs bilaterally, largest of which measures only 4 mm in the right lower lobe (axial image 70 of series 8). No other larger more suspicious appearing pulmonary nodules or masses are noted. No acute consolidative airspace disease. No pleural effusions.  Musculoskeletal/Soft Tissues: There are no aggressive appearing lytic or blastic lesions noted in the visualized portions of the skeleton.  CTA ABDOMEN AND PELVIS FINDINGS  Hepatobiliary: 4 mm hypervascular lesion in segment 2 of the liver (axial image 80 of series 7), too small to characterize, but statistically likely to represent a small flash fill cavernous hemangioma or perfusion anomaly. No other larger more suspicious appearing hepatic lesions. No intra or extrahepatic biliary ductal dilatation. Tiny calcified gallstones lying dependently in the neck of the gallbladder. No findings to suggest an acute cholecystitis at this time.  Pancreas: No pancreatic mass. No pancreatic ductal dilatation. No pancreatic or peripancreatic fluid collections or inflammatory changes.  Spleen: Unremarkable.  Adrenals/Urinary Tract: Bilateral kidneys and bilateral adrenal glands are normal in appearance. No hydroureteronephrosis. Urinary bladder is normal  in appearance.  Stomach/Bowel: Normal appearance of the stomach. No pathologic dilatation of small bowel or colon. The appendix is not confidently identified and may be surgically absent. Regardless, there are no inflammatory changes noted adjacent to the cecum to suggest the presence of an acute appendicitis at this time.  Vascular/Lymphatic: Aortic atherosclerosis, with vascular findings and measurements pertinent to potential TAVR procedure, as detailed below. No aneurysm or dissection noted in the abdominal or pelvic vasculature. No lymphadenopathy noted in the abdomen or pelvis.  Reproductive: Prostate gland is severely enlarged with median lobe hypertrophy measuring 6.6 x 6.5 x 6.7 cm. Seminal vesicles are unremarkable in appearance.  Other: No significant volume of ascites.  No pneumoperitoneum.  Musculoskeletal: There are no aggressive appearing lytic or blastic lesions noted in the visualized portions of the skeleton.  VASCULAR MEASUREMENTS PERTINENT TO TAVR:  AORTA:  Minimal Aortic Diameter-12 x 12 mm  Severity of Aortic Calcification-moderate to severe  RIGHT PELVIS:  Right Common Iliac Artery -  Minimal Diameter-10.3 x 9.5 mm  Tortuosity-mild  Calcification-mild  Right External Iliac Artery -  Minimal Diameter-9.3 x 9.1 mm  Tortuosity-moderate  Calcification-none  Right Common Femoral Artery -  Minimal Diameter-8.0 x 7.2 mm  Tortuosity-mild  Calcification-mild  LEFT PELVIS:  Left Common Iliac Artery -  Minimal Diameter-8.7 x 9.3 mm  Tortuosity-mild  Calcification-mild  Left External Iliac Artery -  Minimal Diameter-8.4 x 8.8 mm  Tortuosity-moderate  Calcification-mild  Left Common Femoral Artery -  Minimal Diameter-8.2 x 7.5 mm  Tortuosity-mild  Calcification-mild  Review of the MIP images confirms the above findings.  IMPRESSION: 1. Vascular findings and measurements pertinent to  potential TAVR procedure, as detailed above. 2. Severe thickening calcification of the aortic valve, compatible with reported clinical history of severe aortic stenosis. 3. Aortic atherosclerosis, in addition to left main and 3 vessel coronary artery disease. Assessment for potential risk factor modification, dietary therapy or pharmacologic therapy may be warranted, if clinically indicated. 4. Small pulmonary nodules scattered throughout the lungs bilaterally measuring 4 mm or less in size, nonspecific, but statistically benign. No follow-up needed if patient is low-risk (and   has no known or suspected primary neoplasm). Non-contrast chest CT can be considered in 12 months if patient is high-risk. This recommendation follows the consensus statement: Guidelines for Management of Incidental Pulmonary Nodules Detected on CT Images: From the Fleischner Society 2017; Radiology 2017; 284:228-243. 5. 4 mm hypervascular lesion in segment 2 of the liver, nonspecific, but strongly favored to represent a benign lesions such as a small flash fill cavernous hemangioma. This could be definitively characterized with follow-up nonemergent abdominal MRI with and without IV gadolinium if of clinical concern. 6. Cholelithiasis without evidence of acute cholecystitis at this time. 7. Prostatomegaly with median lobe hypertrophy. 8. Additional incidental findings, as above.   Recent Labs: 02/08/2019: ALT 18; Hemoglobin 13.0; Platelets 223 11/09/2019: BUN 34; Creatinine, Ser 1.49; Potassium 4.8; Sodium 135    Wt Readings from Last 3 Encounters:  11/10/19 186 lb (84.4 kg)  11/03/19 188 lb 3.2 oz (85.4 kg)  09/02/19 183 lb (83 kg)    Assessment and Plan:   1. Severe Aortic Valve Stenosis: He has severe, stage D aortic valve stenosis. I have personally reviewed the echo images. The aortic valve is thickened, calcified with limited leaflet mobility. I think he would benefit from AVR. Given advanced age, he  is not a good candidate for conventional AVR by surgical approach. I think he may be a good candidate for TAVR. See below. Will repeat cardiac cath prior to TAVR and will likely place a stent in the distal RCA lesion.   STS Risk Score:  Risk of Mortality: 1.208% Renal Failure: 1.698% Permanent Stroke: 1.127% Prolonged Ventilation: 4.979% DSW Infection: 0.083% Reoperation: 3.707% Morbidity or Mortality: 8.754% Short Length of Stay: 47.025% Long Length of Stay: 3.740%  I have reviewed the natural history of aortic stenosis with the patient and their family members  who are present today. We have discussed the limitations of medical therapy and the poor prognosis associated with symptomatic aortic stenosis. We have reviewed potential treatment options, including palliative medical therapy, conventional surgical aortic valve replacement, and transcatheter aortic valve replacement. We discussed treatment options in the context of the patient's specific comorbid medical conditions.   He would like to proceed with planning for TAVR. I think it is necessary to repeat his cardiac cath given the moderately severe distal RCA stenosis by cath in January 2021 and his anginal type symptoms. Risks and benefits of the cath procedure and the valve procedure reviewed with the patient. His scans are complete. Will arrange carotid artery dopplers now and he will be referred to see one of the CT surgeons on our TAVR team.   2. CAD with unstable angina: He is having chest pain with moderate exertion. Cardiac cath from January 2021 reviewed today. The distal RCA lesion was borderline at that time and was managed medically due his lack of symptoms. Now that he is having angina type pain, will proceed with repeat cardiac cath and likely stenting of the distal RCA prior to TAVR.  I have reviewed the risks, indications, and alternatives to cardiac catheterization, possible angioplasty, and stenting with the patient.  Risks include but are not limited to bleeding, infection, vascular injury, stroke, myocardial infection, arrhythmia, kidney injury, radiation-related injury in the case of prolonged fluoroscopy use, emergency cardiac surgery, and death. The patient understands the risks of serious complication is 1-2 in 1000 with diagnostic cardiac cath and 1-2% or less with angioplasty/stenting. -Continue ASA, beta blocker and Vytorin.   Current medicines are reviewed at length with the patient today.    The patient does not have concerns regarding medicines.  The following changes have been made:  no change  Labs/ tests ordered today include:   No orders of the defined types were placed in this encounter.    Disposition:   F/U with the valve team   Signed, Clela Hagadorn, MD 11/10/2019 9:52 AM     [facility] [address], [place], [place]  [address] Phone: [phone]; Fax: [phone]    

## 2019-11-10 ENCOUNTER — Other Ambulatory Visit: Payer: Self-pay

## 2019-11-10 ENCOUNTER — Encounter: Payer: Self-pay | Admitting: *Deleted

## 2019-11-10 ENCOUNTER — Ambulatory Visit (INDEPENDENT_AMBULATORY_CARE_PROVIDER_SITE_OTHER): Payer: Medicare Other | Admitting: Cardiovascular Disease

## 2019-11-10 ENCOUNTER — Encounter: Payer: Self-pay | Admitting: Cardiovascular Disease

## 2019-11-10 VITALS — BP 120/66 | HR 69 | Ht 70.0 in | Wt 186.0 lb

## 2019-11-10 DIAGNOSIS — I350 Nonrheumatic aortic (valve) stenosis: Secondary | ICD-10-CM

## 2019-11-10 DIAGNOSIS — I25110 Atherosclerotic heart disease of native coronary artery with unstable angina pectoris: Secondary | ICD-10-CM | POA: Diagnosis not present

## 2019-11-10 DIAGNOSIS — Z01812 Encounter for preprocedural laboratory examination: Secondary | ICD-10-CM | POA: Diagnosis not present

## 2019-11-10 DIAGNOSIS — I2510 Atherosclerotic heart disease of native coronary artery without angina pectoris: Secondary | ICD-10-CM

## 2019-11-10 DIAGNOSIS — Z9861 Coronary angioplasty status: Secondary | ICD-10-CM | POA: Diagnosis not present

## 2019-11-10 LAB — CBC
Hematocrit: 37.3 % — ABNORMAL LOW (ref 37.5–51.0)
Hemoglobin: 12.1 g/dL — ABNORMAL LOW (ref 13.0–17.7)
MCH: 30.6 pg (ref 26.6–33.0)
MCHC: 32.4 g/dL (ref 31.5–35.7)
MCV: 94 fL (ref 79–97)
Platelets: 244 10*3/uL (ref 150–450)
RBC: 3.96 x10E6/uL — ABNORMAL LOW (ref 4.14–5.80)
RDW: 13.6 % (ref 11.6–15.4)
WBC: 15 10*3/uL — ABNORMAL HIGH (ref 3.4–10.8)

## 2019-11-10 LAB — BASIC METABOLIC PANEL
BUN/Creatinine Ratio: 23 (ref 10–24)
BUN: 30 mg/dL — ABNORMAL HIGH (ref 8–27)
CO2: 26 mmol/L (ref 20–29)
Calcium: 9.7 mg/dL (ref 8.6–10.2)
Chloride: 102 mmol/L (ref 96–106)
Creatinine, Ser: 1.33 mg/dL — ABNORMAL HIGH (ref 0.76–1.27)
GFR calc Af Amer: 58 mL/min/{1.73_m2} — ABNORMAL LOW (ref >59)
GFR calc non Af Amer: 50 mL/min/{1.73_m2} — ABNORMAL LOW (ref >59)
Glucose: 334 mg/dL — ABNORMAL HIGH (ref 65–99)
Potassium: 4.8 mmol/L (ref 3.5–5.2)
Sodium: 135 mmol/L (ref 134–144)

## 2019-11-10 NOTE — Patient Instructions (Addendum)
Medication Instructions:  No changes today *If you need a refill on your cardiac medications before your next appointment, please call your pharmacy*   Lab Work: Today: BMET, CBC  If you have labs (blood work) drawn today and your tests are completely normal, you will receive your results only by:  [place] (if you have MyChart) OR  A paper copy in the mail If you have any lab test that is abnormal or we need to change your treatment, we will call you to review the results.   Testing/Procedures: Your physician has requested that you have a cardiac catheterization. Cardiac catheterization is used to diagnose and/or treat various heart conditions. Doctors may recommend this procedure for a number of different reasons. The most common reason is to evaluate chest pain. Chest pain can be a symptom of coronary artery disease (CAD), and cardiac catheterization can show whether plaque is narrowing or blocking your hearts arteries. This procedure is also used to evaluate the valves, as well as measure the blood flow and oxygen levels in different parts of your heart. For further information please visit [facility]. Please follow instruction sheet, as given.   Follow-Up: As planned  Other Instructions  Carotid Ultrasound and a Physical Therapy Evaluation will be arranged closer to the time of valve surgery.

## 2019-11-12 ENCOUNTER — Other Ambulatory Visit (HOSPITAL_COMMUNITY)
Admission: RE | Admit: 2019-11-12 | Discharge: 2019-11-12 | Disposition: A | Discharge status: Home / Self Care | Payer: Medicare Other | Source: Ambulatory Visit | Attending: Cardiovascular Disease | Admitting: Cardiovascular Disease

## 2019-11-12 DIAGNOSIS — Z20822 Contact with and (suspected) exposure to covid-19: Secondary | ICD-10-CM | POA: Insufficient documentation

## 2019-11-12 DIAGNOSIS — Z01812 Encounter for preprocedural laboratory examination: Secondary | ICD-10-CM | POA: Diagnosis not present

## 2019-11-12 LAB — SARS CORONAVIRUS 2 (TAT 6-24 HRS): SARS Coronavirus 2: NEGATIVE

## 2019-11-14 ENCOUNTER — Telehealth: Payer: Self-pay | Admitting: *Deleted

## 2019-11-14 NOTE — Telephone Encounter (Signed)
Pt contacted pre-catheterization scheduled at [facility] for: Tuesday November 15, 2019 12 Noon Verified arrival time and place: [facility]) at: 10 AM   No solid food after midnight prior to cath, clear liquids until 5 AM day of procedure.  Hold: Ramipril-day before and day of procedure-GFR 50 Glucovance-day of procedure and 48 hours post procedure Jardiance-AM of procedure Pt states these are the only AM diabetes medications.   Except hold medications AM meds can be  taken pre-cath with sips of water including: ASA 81 mg   Confirmed patient has responsible adult to drive home post procedure and be with patient first 24 hours after arriving home: yes  You are allowed ONE visitor in the waiting room during the time you are at the hospital for your procedure. Both you and your visitor must wear a mask once you enter the hospital.       COVID-19 Pre-Screening Questions:  . In the past 14 days have you had a new cough, new headache, new nasal congestion, fever (100.4 or greater) unexplained body aches, new sore throat, or sudden loss of taste or sense of smell? no . In the past 14 days have you been around anyone with known Covid 19? No   Reviewed procedure/mask/visitor instructions, COVID-19 questions with patient.

## 2019-11-15 ENCOUNTER — Encounter (HOSPITAL_COMMUNITY)
Admission: RE | Disposition: A | Discharge status: Home / Self Care | Payer: Self-pay | Source: Home / Self Care | Attending: Cardiovascular Disease

## 2019-11-15 ENCOUNTER — Other Ambulatory Visit: Payer: Self-pay

## 2019-11-15 ENCOUNTER — Ambulatory Visit (HOSPITAL_COMMUNITY)
Admission: RE | Admit: 2019-11-15 | Discharge: 2019-11-15 | Disposition: A | Discharge status: Home / Self Care | Payer: Medicare Other | Attending: Cardiovascular Disease | Admitting: Cardiovascular Disease

## 2019-11-15 DIAGNOSIS — Z7984 Long term (current) use of oral hypoglycemic drugs: Secondary | ICD-10-CM | POA: Diagnosis not present

## 2019-11-15 DIAGNOSIS — Z79899 Other long term (current) drug therapy: Secondary | ICD-10-CM | POA: Insufficient documentation

## 2019-11-15 DIAGNOSIS — I129 Hypertensive chronic kidney disease with stage 1 through stage 4 chronic kidney disease, or unspecified chronic kidney disease: Secondary | ICD-10-CM | POA: Insufficient documentation

## 2019-11-15 DIAGNOSIS — I350 Nonrheumatic aortic (valve) stenosis: Secondary | ICD-10-CM

## 2019-11-15 DIAGNOSIS — Z7982 Long term (current) use of aspirin: Secondary | ICD-10-CM | POA: Insufficient documentation

## 2019-11-15 DIAGNOSIS — Z955 Presence of coronary angioplasty implant and graft: Secondary | ICD-10-CM | POA: Insufficient documentation

## 2019-11-15 DIAGNOSIS — N189 Chronic kidney disease, unspecified: Secondary | ICD-10-CM | POA: Insufficient documentation

## 2019-11-15 DIAGNOSIS — E1122 Type 2 diabetes mellitus with diabetic chronic kidney disease: Secondary | ICD-10-CM | POA: Insufficient documentation

## 2019-11-15 DIAGNOSIS — E785 Hyperlipidemia, unspecified: Secondary | ICD-10-CM | POA: Diagnosis not present

## 2019-11-15 DIAGNOSIS — Z87891 Personal history of nicotine dependence: Secondary | ICD-10-CM | POA: Insufficient documentation

## 2019-11-15 DIAGNOSIS — K219 Gastro-esophageal reflux disease without esophagitis: Secondary | ICD-10-CM | POA: Insufficient documentation

## 2019-11-15 DIAGNOSIS — I25110 Atherosclerotic heart disease of native coronary artery with unstable angina pectoris: Secondary | ICD-10-CM | POA: Diagnosis not present

## 2019-11-15 DIAGNOSIS — I2510 Atherosclerotic heart disease of native coronary artery without angina pectoris: Secondary | ICD-10-CM

## 2019-11-15 DIAGNOSIS — I25118 Atherosclerotic heart disease of native coronary artery with other forms of angina pectoris: Secondary | ICD-10-CM

## 2019-11-15 HISTORY — PX: RIGHT/LEFT HEART CATH AND CORONARY ANGIOGRAPHY: CATH118266

## 2019-11-15 HISTORY — PX: INTRAVASCULAR PRESSURE WIRE/FFR STUDY: CATH118243

## 2019-11-15 LAB — POCT I-STAT 7, (LYTES, BLD GAS, ICA,H+H)
Acid-base deficit: 1 mmol/L (ref 0.0–2.0)
Bicarbonate: 24.3 mmol/L (ref 20.0–28.0)
Calcium, Ion: 1.3 mmol/L (ref 1.15–1.40)
HCT: 36 % — ABNORMAL LOW (ref 39.0–52.0)
Hemoglobin: 12.2 g/dL — ABNORMAL LOW (ref 13.0–17.0)
O2 Saturation: 98 %
Potassium: 4.4 mmol/L (ref 3.5–5.1)
Sodium: 142 mmol/L (ref 135–145)
TCO2: 26 mmol/L (ref 22–32)
pCO2 arterial: 40.8 mmHg (ref 32.0–48.0)
pH, Arterial: 7.383 (ref 7.350–7.450)
pO2, Arterial: 112 mmHg — ABNORMAL HIGH (ref 83.0–108.0)

## 2019-11-15 LAB — GLUCOSE, CAPILLARY
Glucose-Capillary: 194 mg/dL — ABNORMAL HIGH (ref 70–99)
Glucose-Capillary: 99 mg/dL (ref 70–99)

## 2019-11-15 LAB — POCT ACTIVATED CLOTTING TIME: Activated Clotting Time: 246 seconds

## 2019-11-15 LAB — POCT I-STAT EG7
Acid-base deficit: 1 mmol/L (ref 0.0–2.0)
Bicarbonate: 25.3 mmol/L (ref 20.0–28.0)
Calcium, Ion: 1.34 mmol/L (ref 1.15–1.40)
HCT: 35 % — ABNORMAL LOW (ref 39.0–52.0)
Hemoglobin: 11.9 g/dL — ABNORMAL LOW (ref 13.0–17.0)
O2 Saturation: 66 %
Potassium: 4.3 mmol/L (ref 3.5–5.1)
Sodium: 142 mmol/L (ref 135–145)
TCO2: 27 mmol/L (ref 22–32)
pCO2, Ven: 47.3 mmHg (ref 44.0–60.0)
pH, Ven: 7.336 (ref 7.250–7.430)
pO2, Ven: 37 mmHg (ref 32.0–45.0)

## 2019-11-15 SURGERY — RIGHT/LEFT HEART CATH AND CORONARY ANGIOGRAPHY
Anesthesia: LOCAL

## 2019-11-15 MED ORDER — HEPARIN SODIUM (PORCINE) 1000 UNIT/ML IJ SOLN
INTRAMUSCULAR | Status: AC
  Filled 2019-11-15: qty 1

## 2019-11-15 MED ORDER — MIDAZOLAM HCL 2 MG/2ML IJ SOLN
INTRAMUSCULAR | Status: AC
  Filled 2019-11-15: qty 2

## 2019-11-15 MED ORDER — MIDAZOLAM HCL 2 MG/2ML IJ SOLN
INTRAMUSCULAR | Status: DC | PRN
  Administered 2019-11-15: 1 mg via INTRAVENOUS
  Administered 2019-11-15: 2 mg via INTRAVENOUS

## 2019-11-15 MED ORDER — SODIUM CHLORIDE 0.9 % IV SOLN: 250.0000 mL | INTRAVENOUS | Status: DC | PRN

## 2019-11-15 MED ORDER — HYDRALAZINE HCL 20 MG/ML IJ SOLN: 10.0000 mg | INTRAMUSCULAR | Status: DC | PRN

## 2019-11-15 MED ORDER — SODIUM CHLORIDE 0.9 % WEIGHT BASED INFUSION
3.0000 mL/kg/h | INTRAVENOUS | Status: AC
  Administered 2019-11-15: 3 mL/kg/h via INTRAVENOUS

## 2019-11-15 MED ORDER — HEPARIN (PORCINE) IN NACL 1000-0.9 UT/500ML-% IV SOLN
INTRAVENOUS | Status: DC | PRN
  Administered 2019-11-15 (×2): 500 mL

## 2019-11-15 MED ORDER — ONDANSETRON HCL 4 MG/2ML IJ SOLN: 4.0000 mg | Freq: Four times a day (QID) | INTRAMUSCULAR | Status: DC | PRN

## 2019-11-15 MED ORDER — VERAPAMIL HCL 2.5 MG/ML IV SOLN
INTRAVENOUS | Status: DC | PRN
  Administered 2019-11-15: 10 mL via INTRA_ARTERIAL

## 2019-11-15 MED ORDER — LIDOCAINE HCL (PF) 1 % IJ SOLN
INTRAMUSCULAR | Status: AC
  Filled 2019-11-15: qty 30

## 2019-11-15 MED ORDER — LABETALOL HCL 5 MG/ML IV SOLN: 10.0000 mg | INTRAVENOUS | Status: DC | PRN

## 2019-11-15 MED ORDER — IOHEXOL 350 MG/ML SOLN
INTRAVENOUS | Status: DC | PRN
  Administered 2019-11-15: 55 mL via INTRA_ARTERIAL

## 2019-11-15 MED ORDER — SODIUM CHLORIDE 0.9% FLUSH: 3.0000 mL | Freq: Two times a day (BID) | INTRAVENOUS | Status: DC

## 2019-11-15 MED ORDER — FENTANYL CITRATE (PF) 100 MCG/2ML IJ SOLN
INTRAMUSCULAR | Status: DC | PRN
  Administered 2019-11-15 (×2): 25 ug via INTRAVENOUS

## 2019-11-15 MED ORDER — ASPIRIN 81 MG PO CHEW: 81.0000 mg | CHEWABLE_TABLET | ORAL | Status: DC

## 2019-11-15 MED ORDER — HEPARIN (PORCINE) IN NACL 1000-0.9 UT/500ML-% IV SOLN
INTRAVENOUS | Status: AC
  Filled 2019-11-15: qty 1000

## 2019-11-15 MED ORDER — FENTANYL CITRATE (PF) 100 MCG/2ML IJ SOLN
INTRAMUSCULAR | Status: AC
  Filled 2019-11-15: qty 2

## 2019-11-15 MED ORDER — SODIUM CHLORIDE 0.9 % IV SOLN: INTRAVENOUS | Status: AC

## 2019-11-15 MED ORDER — ACETAMINOPHEN 325 MG PO TABS: 650.0000 mg | ORAL_TABLET | ORAL | Status: DC | PRN

## 2019-11-15 MED ORDER — LIDOCAINE HCL (PF) 1 % IJ SOLN
INTRAMUSCULAR | Status: DC | PRN
  Administered 2019-11-15: 2 mL

## 2019-11-15 MED ORDER — SODIUM CHLORIDE 0.9 % WEIGHT BASED INFUSION: 1.0000 mL/kg/h | INTRAVENOUS | Status: DC

## 2019-11-15 MED ORDER — SODIUM CHLORIDE 0.9% FLUSH: 3.0000 mL | INTRAVENOUS | Status: DC | PRN

## 2019-11-15 MED ORDER — VERAPAMIL HCL 2.5 MG/ML IV SOLN
INTRAVENOUS | Status: AC
  Filled 2019-11-15: qty 2

## 2019-11-15 MED ORDER — HEPARIN SODIUM (PORCINE) 1000 UNIT/ML IJ SOLN
INTRAMUSCULAR | Status: DC | PRN
  Administered 2019-11-15 (×2): 5000 [IU] via INTRAVENOUS

## 2019-11-15 SURGICAL SUPPLY — 17 items
CATH 5FR JL3.5 JR4 ANG PIG MP (CATHETERS) ×2 IMPLANT
CATH BALLN WEDGE 5F 110CM (CATHETERS) ×2 IMPLANT
CATH INFINITI 5FR AL1 (CATHETERS) ×2 IMPLANT
CATH VISTA GUIDE 6FR JR4 (CATHETERS) ×2 IMPLANT
DEVICE RAD COMP TR BAND LRG (VASCULAR PRODUCTS) ×1 IMPLANT
GLIDESHEATH SLEND SS 6F .021 (SHEATH) ×2 IMPLANT
GUIDEWIRE .025 260CM (WIRE) ×2 IMPLANT
GUIDEWIRE INQWIRE 1.5J.035X260 (WIRE) ×1 IMPLANT
GUIDEWIRE PRESSURE COMET II (WIRE) ×2 IMPLANT
INQWIRE 1.5J .035X260CM (WIRE) ×2
KIT ESSENTIALS PG (KITS) ×2 IMPLANT
KIT HEART LEFT (KITS) ×2 IMPLANT
PACK CARDIAC CATHETERIZATION (CUSTOM PROCEDURE TRAY) ×2 IMPLANT
SHEATH GLIDE SLENDER 4/5FR (SHEATH) ×2 IMPLANT
TRANSDUCER W/STOPCOCK (MISCELLANEOUS) ×2 IMPLANT
TUBING CIL FLEX 10 FLL-RA (TUBING) ×2 IMPLANT
WIRE EMERALD ST .035X260CM (WIRE) ×2 IMPLANT

## 2019-11-15 NOTE — Progress Notes (Signed)
Pt wished to leave pants and shoes on until he goes over to cath lab. Instructed pt to remove before going to cath lab.

## 2019-11-15 NOTE — Interval H&P Note (Signed)
History and Physical Interval Note:  11/15/2019 10:53 AM  Barry Zavala  has presented today for surgery, with the diagnosis of CAD, aortic stenosis.  The various methods of treatment have been discussed with the patient and family. After consideration of risks, benefits and other options for treatment, the patient has consented to  Procedure(s): RIGHT/LEFT HEART CATH AND CORONARY ANGIOGRAPHY (N/A) as a surgical intervention.  The patient's history has been reviewed, patient examined, no change in status, stable for surgery.  I have reviewed the patient's chart and labs.  Questions were answered to the patient's satisfaction.    Cath Lab Visit (complete for each Cath Lab visit)  Clinical Evaluation Leading to the Procedure:   ACS: No.  Non-ACS:    Anginal Classification: CCS III  Anti-ischemic medical therapy: Minimal Therapy (1 class of medications)  Non-Invasive Test Results: No non-invasive testing performed  Prior CABG: No previous CABG     Lauree Chandler

## 2019-11-15 NOTE — Discharge Instructions (Signed)
Hold Glucovance for 48 hours post cath.   Drink plenty of fluids for 48 hours and keep wrist elevated at heart level for 24 hours  Radial Site Care   This sheet gives you information about how to care for yourself after your procedure. Your health care provider may also give you more specific instructions. If you have problems or questions, contact your health care provider. What can I expect after the procedure? After the procedure, it is common to have:  Bruising and tenderness at the catheter insertion area. Follow these instructions at home: Medicines  Take over-the-counter and prescription medicines only as told by your health care provider. Insertion site care 1. Follow instructions from your health care provider about how to take care of your insertion site. Make sure you: ? Wash your hands with soap and water before you change your bandage (dressing). If soap and water are not available, use hand sanitizer. ? Remove your dressing as told by your health care provider. In 24 hours 2. Check your insertion site every day for signs of infection. Check for: ? Redness, swelling, or pain. ? Fluid or blood. ? Pus or a bad smell. ? Warmth. 3. Do not take baths, swim, or use a hot tub until your health care provider approves. 4. You may shower 24-48 hours after the procedure, or as directed by your health care provider. ? Remove the dressing and gently wash the site with plain soap and water. ? Pat the area dry with a clean towel. ? Do not rub the site. That could cause bleeding. 5. Do not apply powder or lotion to the site. Activity   1. For 24 hours after the procedure, or as directed by your health care provider: ? Do not flex or bend the affected arm. ? Do not push or pull heavy objects with the affected arm. ? Do not drive yourself home from the hospital or clinic. You may drive 24 hours after the procedure unless your health care provider tells you not to. ? Do not operate  machinery or power tools. 2. Do not lift anything that is heavier than 10 lb (4.5 kg), or the limit that you are told, until your health care provider says that it is safe.  For 4 days 3. Ask your health care provider when it is okay to: ? Return to work or school. ? Resume usual physical activities or sports. ? Resume sexual activity. General instructions  If the catheter site starts to bleed, raise your arm and put firm pressure on the site. If the bleeding does not stop, get help right away. This is a medical emergency.  If you went home on the same day as your procedure, a responsible adult should be with you for the first 24 hours after you arrive home.  Keep all follow-up visits as told by your health care provider. This is important. Contact a health care provider if:  You have a fever.  You have redness, swelling, or yellow drainage around your insertion site. Get help right away if:  You have unusual pain at the radial site.  The catheter insertion area swells very fast.  The insertion area is bleeding, and the bleeding does not stop when you hold steady pressure on the area.  Your arm or hand becomes pale, cool, tingly, or numb. These symptoms may represent a serious problem that is an emergency. Do not wait to see if the symptoms will go away. Get medical help right away. Call  your local emergency services (911 in the [place] Do not drive yourself to the hospital. Summary  After the procedure, it is common to have bruising and tenderness at the site.  Follow instructions from your health care provider about how to take care of your radial site wound. Check the wound every day for signs of infection.  Do not lift anything that is heavier than 10 lb (4.5 kg), or the limit that you are told, until your health care provider says that it is safe. This information is not intended to replace advice given to you by your health care provider. Make sure you discuss any questions  you have with your health care provider. Document Revised: 01/28/2017 Document Reviewed: 01/28/2017 Elsevier Patient Education  2020 [employer].

## 2019-11-16 ENCOUNTER — Encounter (HOSPITAL_COMMUNITY): Payer: Self-pay | Admitting: Cardiovascular Disease

## 2019-11-21 ENCOUNTER — Encounter: Payer: Self-pay | Admitting: Physician Assistant

## 2019-11-21 DIAGNOSIS — E119 Type 2 diabetes mellitus without complications: Secondary | ICD-10-CM | POA: Insufficient documentation

## 2019-11-21 DIAGNOSIS — N183 Chronic kidney disease, stage 3 unspecified: Secondary | ICD-10-CM | POA: Insufficient documentation

## 2019-11-21 DIAGNOSIS — I10 Essential (primary) hypertension: Secondary | ICD-10-CM | POA: Insufficient documentation

## 2019-11-21 DIAGNOSIS — K589 Irritable bowel syndrome without diarrhea: Secondary | ICD-10-CM | POA: Insufficient documentation

## 2019-11-21 DIAGNOSIS — K219 Gastro-esophageal reflux disease without esophagitis: Secondary | ICD-10-CM | POA: Insufficient documentation

## 2019-11-30 ENCOUNTER — Ambulatory Visit: Payer: Medicare Other | Attending: Cardiovascular Disease | Admitting: Physical Therapy

## 2019-11-30 ENCOUNTER — Ambulatory Visit (HOSPITAL_COMMUNITY)
Admission: RE | Admit: 2019-11-30 | Discharge: 2019-11-30 | Disposition: A | Discharge status: Home / Self Care | Payer: Medicare Other | Source: Ambulatory Visit | Attending: Physician Assistant | Admitting: Physician Assistant

## 2019-11-30 ENCOUNTER — Other Ambulatory Visit: Payer: Self-pay

## 2019-11-30 ENCOUNTER — Encounter: Payer: Self-pay | Admitting: Surgery

## 2019-11-30 ENCOUNTER — Institutional Professional Consult (permissible substitution) (INDEPENDENT_AMBULATORY_CARE_PROVIDER_SITE_OTHER): Payer: Medicare Other | Admitting: Surgery

## 2019-11-30 ENCOUNTER — Encounter: Payer: Self-pay | Admitting: Physical Therapy

## 2019-11-30 VITALS — BP 128/73 | HR 76 | Temp 97.7°F | Resp 20 | Ht 70.0 in | Wt 180.0 lb

## 2019-11-30 DIAGNOSIS — R293 Abnormal posture: Secondary | ICD-10-CM | POA: Insufficient documentation

## 2019-11-30 DIAGNOSIS — Z9861 Coronary angioplasty status: Secondary | ICD-10-CM | POA: Diagnosis not present

## 2019-11-30 DIAGNOSIS — I350 Nonrheumatic aortic (valve) stenosis: Secondary | ICD-10-CM

## 2019-11-30 DIAGNOSIS — I2510 Atherosclerotic heart disease of native coronary artery without angina pectoris: Secondary | ICD-10-CM

## 2019-11-30 NOTE — Therapy (Signed)
[place], [place], [address] Phone: [phone]   Fax:  [phone]  Physical Therapy Evaluation  Patient Details  Name: Barry Zavala MRN: [id number] Date of Birth: 03-22-1940 Referring Provider (PT): Murvin Natal MD   Encounter Date: 11/30/2019   PT End of Session - 11/30/19 1027    Visit Number 1    Number of Visits 1    Date for PT Re-Evaluation 12/01/19    PT Start Time 1028    PT Stop Time 1053    PT Time Calculation (min) 25 min    Activity Tolerance Patient tolerated treatment well    Behavior During Therapy [facility] for tasks assessed/performed           Past Medical History:  Diagnosis Date  . Anemia   . Arthritis    hands  . Chronic kidney disease (CKD), stage III (moderate) (HCC)   . Coronary artery disease   . Elevated PSA   . Erectile dysfunction   . GERD (gastroesophageal reflux disease)   . Hyperlipidemia   . Hypertension   . Irritable bowel syndrome   . Obesity   . Onychomycosis   . Palmar fibromatosis   . Right bundle branch block   . Severe aortic stenosis   . 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 Type 2 diabetes mellitus ([place])     Past Surgical History:  Procedure Laterality Date  . APPENDECTOMY    . cardiac stents    . CARPAL TUNNEL RELEASE Left 12/09/2016   Procedure: CARPAL TUNNEL RELEASE;  Surgeon: Daryll Brod, MD;  Location: [place];  Service: Orthopedics;  Laterality: Left;  . CARPAL TUNNEL WITH CUBITAL TUNNEL Right 01/13/2017   Procedure: RIGHT CARPAL TUNNEL WITH CUBITAL TUNNEL RELEASE;  Surgeon: Daryll Brod, MD;  Location: [place];  Service: Orthopedics;  Laterality: Right;  . INTRAVASCULAR PRESSURE WIRE/FFR STUDY N/A 11/15/2019   Procedure: INTRAVASCULAR PRESSURE WIRE/FFR STUDY;  Surgeon: Burnell Blanks, MD;  Location: [place] [facility];  Service: Cardiovascular;   Laterality: N/A;  . RIGHT/LEFT HEART CATH AND CORONARY ANGIOGRAPHY N/A 02/02/2019   Procedure: RIGHT/LEFT HEART CATH AND CORONARY ANGIOGRAPHY;  Surgeon: Belva Crome, MD;  Location: [facility];  Service: Cardiovascular;  Laterality: N/A;  . RIGHT/LEFT HEART CATH AND CORONARY ANGIOGRAPHY N/A 11/15/2019   Procedure: RIGHT/LEFT HEART CATH AND CORONARY ANGIOGRAPHY;  Surgeon: Burnell Blanks, MD;  Location: [place] [facility];  Service: Cardiovascular;  Laterality: N/A;  . TRIGGER FINGER RELEASE Left 08/10/2018   Procedure: RELEASE TRIGGER FINGER/A-1 PULLEY THUMB AND LEFT SMALL;  Surgeon: Daryll Brod, MD;  Location: [place];  Service: Orthopedics;  Laterality: Left;  FAB  . ULNAR NERVE TRANSPOSITION Left 12/09/2016   Procedure: DECOMPRESSION LEFT ULNAR NERVE;  Surgeon: Daryll Brod, MD;  Location: [place];  Service: Orthopedics;  Laterality: Left;  . ULNAR NERVE TRANSPOSITION Right 01/13/2017   Procedure: DECOMPRESSION ULNAR NERVE;  Surgeon: Daryll Brod, MD;  Location: [place];  Service: Orthopedics;  Laterality: Right;    There were no vitals filed for this visit.    Subjective Assessment - 11/30/19 1032    Subjective pt is a 79 y.o with CC of chest pain that occurs with walking up hill noting burnin in the chest that started 6 weeks ago,  but otherwise notes no other issues.    Patient Stated Goals to fix  heart    Currently in Pain? No/denies              [facility] PT Assessment - 11/30/19 0001      Assessment   Medical Diagnosis severe Aortic stenosis    Referring Provider (PT) Murvin Natal MD    Hand Dominance Right      Precautions   Precautions None      Restrictions   Weight Bearing Restrictions No      Balance Screen   Has the patient fallen in the past 6 months No    Has the patient had a decrease in activity level because of a fear of falling?  No      Home Ecologist  residence    Living Arrangements Spouse/significant other    Available Help at Discharge Family    Type of [place] to enter    Entrance Stairs-Number of Steps 1    [place] One level      Prior Function   Level of Independence Independent      ROM / Strength   AROM / PROM / Strength AROM;Strength      AROM   Overall AROM  Within functional limits for tasks performed      Strength   Overall Strength Within functional limits for tasks performed    Right Hand Grip (lbs) 70    Left Hand Grip (lbs) 56      Ambulation/Gait   Gait Pattern Within Functional Limits            [facility] Pre-Surgical Assessment - 11/30/19 0001    5 Meter Walk Test- trial 1 3 sec    5 Meter Walk Test- trial 2 3 sec.     5 Meter Walk Test- trial 3 3 sec.    5 meter walk test average 3 sec    4 Stage Balance Test tolerated for:  4 sec.    4 Stage Balance Test Position 3    Sit To Stand Test- trial 1 12 sec.    ADL/IADL Independent with: Bathing;Dressing;Meal prep;Finances;Yard work    ADL/IADL Therapist, sports Index Vulnerable    6 Minute Walk- Baseline yes    BP (mmHg) 127/84    HR (bpm) 73    02 Sat (%RA) 98 %    Modified Borg Scale for Dyspnea 0- Nothing at all    Perceived Rate of Exertion (Borg) 6-    6 Minute Walk Post Test yes    BP (mmHg) 146/108    HR (bpm) 95    02 Sat (%RA) 97 %    Modified Borg Scale for Dyspnea 0- Nothing at all    Perceived Rate of Exertion (Borg) 9- very light    Aerobic Endurance Distance Walked 1647    Endurance additional comments pt demonstrated 4.74% limitation compared to age related norm                    Objective measurements completed on examination: See above findings.                            Plan - 11/30/19 1056    Clinical Impression Statement see assessment in note    Stability/Clinical Decision Making Stable/Uncomplicated    Clinical Decision Making Low    PT Frequency One time visit     PT Next Visit Plan Pre- TAVR evaluation  Consulted and Agree with Plan of Care Patient            Clinical Impression Statement: Pt is a 79 yo M presenting to OP PT for evaluation prior to possible TAVR surgery due to severe aortic stenosis. Pt reports onset of burning in his chest when walking up hil approximately 6  Weeks ago. Symptoms are limiting up hill walking. Pt presents with good ROM and strength, fair balance and is assessed as low at high fall risk 4 stage balance test, good walking speed and good aerobic endurance per 6 minute walk test. Pt ambulated 1647 feet in without requiring a rest break. At time of rest, patient's HR was 97 bpm and O2 was 97% on room air. Pt reported 0/10 shortness of breath on modified scale for dyspnea. Pt ambulated a total of 1647 feet in 6 minute walk. Pt reported no issues during or following  6 minute walk test. Based on the Short Physical Performance Battery, patient has a frailty rating of 10/12 with </= 5/12 considered frail.    Patient demonstrated the following deficits and impairments:     Visit Diagnosis: Abnormal posture     Problem List Patient Active Problem List   Diagnosis Date Noted  . GERD (gastroesophageal reflux disease)   . Hypertension   . Irritable bowel syndrome   . Type 2 diabetes mellitus ([place])   . Chronic kidney disease (CKD), stage III (moderate) (HCC)   . Severe aortic stenosis   . Coronary artery disease   . Aortic valve stenosis 02/02/2017  . DJD (degenerative joint disease), cervical 01/30/2016  . 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 Stage 3 chronic kidney disease ([place]) 12/27/2015  . Left carotid bruit 01/31/2015  . Essential hypertension   . Hyperlipidemia   . Non-insulin treated type 2 diabetes mellitus ([place])   . Right bundle branch block   . Coronary artery disease involving native coronary artery of native heart with angina pectoris ([place])   . Obesity     Starr Lake PT, DPT, LAT, ATC   11/30/19  11:01 AM      [facility] [address] [place], [place], [address] Phone: [phone]   Fax:  [phone]  Name: Barry Zavala MRN: [id number] Date of Birth: 07-10-1940

## 2019-11-30 NOTE — Progress Notes (Signed)
Patient ID: Barry Zavala, male   DOB: 1940/08/31, 79 y.o.   MRN: [id number]  [facility] CONSULTATION REPORT  Referring Provider is Belva Crome, MD Primary Cardiologist is Sinclair Grooms, MD PCP is Josetta Huddle, MD  Chief Complaint  Patient presents with  . Aortic Stenosis    Surgical consult for TAVR. review all testing    HPI:  The patient is a 79 year old gentleman with a history of type 2 diabetes, hyperlipidemia, hypertension, stage III chronic kidney disease, right bundle branch block, anemia, arthritis, and coronary artery disease status post MI in 1995 and PCI in 2001.  He had a cardiac cath in January 2021 showing a patent LAD stent with moderate LAD stenosis as well as a patent left circumflex stent.  There is moderate to severe distal RCA stenosis.  An echocardiogram at that time showed severe aortic stenosis with calcified and thickened leaflets and limited mobility.  The mean gradient was 43 mmHg with a peak of 71.2 mmHg.  Aortic valve area was 0.8 cm with a dimensionless index of 0.28.  He was seen by Dr. Angelena Form at that time and was asymptomatic and decision was made to continue close follow-up.  He had a follow-up echocardiogram in July 2021 which showed a mean gradient across aortic valve of 36 mmHg with a valve area of 0.75 cm.  He was seen again in August 2021 and remained asymptomatic.  He followed up with Dr. Daneen Schick in October and reported onset of exertional substernal burning with walking up hills associated with mild shortness of breath.  Therefore he underwent further work-up for consideration of TAVR.  He was seen back by Dr. Angelena Form and underwent cardiac catheterization on 11/15/2019 which showed a moderate stenosis of 65% in the mid to distal RCA.  The DFR of this lesion in the mid to distal RCA was 0.97 suggesting that it was not flow-limiting.  There was patent mid left  circumflex and obtuse marginal stents.  There is a patent stent in the mid LAD.  There was severe aortic stenosis with a mean gradient of 44.6 mmHg and a peak to peak gradient of 49 mmHg.  Aortic valve area was 0.8 cm.  The patient is here today lung with his wife on conference call.  He is a retired Forensic psychologist and lives with his wife in [place].  He said that he continues to be active playing golf 5 days/week, sometimes walking and sometimes using a cart.  He has had no exertional fatigue or shortness of breath.  He does report some burning in his chest with walking up hills only.  Has had no symptoms on level ground or doing normal activities around his house.  He denies orthopnea and PND.  Has had no peripheral edema.  He denies dizziness and syncope.  Past Medical History:  Diagnosis Date  . Anemia   . Arthritis    hands  . Chronic kidney disease (CKD), stage III (moderate) (HCC)   . Coronary artery disease   . Elevated PSA   . Erectile dysfunction   . GERD (gastroesophageal reflux disease)   . Hyperlipidemia   . Hypertension   . Irritable bowel syndrome   . Obesity   . Onychomycosis   . Palmar fibromatosis   . Right bundle branch block   . Severe aortic stenosis   . 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  Type 2 diabetes mellitus ([place])     Past Surgical History:  Procedure Laterality Date  . APPENDECTOMY    . cardiac stents    . CARPAL TUNNEL RELEASE Left 12/09/2016   Procedure: CARPAL TUNNEL RELEASE;  Surgeon: Daryll Brod, MD;  Location: [facility];  Service: Orthopedics;  Laterality: Left;  . CARPAL TUNNEL WITH CUBITAL TUNNEL Right 01/13/2017   Procedure: RIGHT CARPAL TUNNEL WITH CUBITAL TUNNEL RELEASE;  Surgeon: Daryll Brod, MD;  Location: [facility];  Service: Orthopedics;  Laterality: Right;  . INTRAVASCULAR PRESSURE WIRE/FFR STUDY N/A 11/15/2019   Procedure: INTRAVASCULAR PRESSURE WIRE/FFR STUDY;  Surgeon:  Burnell Blanks, MD;  Location: [facility];  Service: Cardiovascular;  Laterality: N/A;  . RIGHT/LEFT HEART CATH AND CORONARY ANGIOGRAPHY N/A 02/02/2019   Procedure: RIGHT/LEFT HEART CATH AND CORONARY ANGIOGRAPHY;  Surgeon: Belva Crome, MD;  Location: [facility];  Service: Cardiovascular;  Laterality: N/A;  . RIGHT/LEFT HEART CATH AND CORONARY ANGIOGRAPHY N/A 11/15/2019   Procedure: RIGHT/LEFT HEART CATH AND CORONARY ANGIOGRAPHY;  Surgeon: Burnell Blanks, MD;  Location: [facility];  Service: Cardiovascular;  Laterality: N/A;  . TRIGGER FINGER RELEASE Left 08/10/2018   Procedure: RELEASE TRIGGER FINGER/A-1 PULLEY THUMB AND LEFT SMALL;  Surgeon: Daryll Brod, MD;  Location: [facility];  Service: Orthopedics;  Laterality: Left;  FAB  . ULNAR NERVE TRANSPOSITION Left 12/09/2016   Procedure: DECOMPRESSION LEFT ULNAR NERVE;  Surgeon: Daryll Brod, MD;  Location: [facility];  Service: Orthopedics;  Laterality: Left;  . ULNAR NERVE TRANSPOSITION Right 01/13/2017   Procedure: DECOMPRESSION ULNAR NERVE;  Surgeon: Daryll Brod, MD;  Location: [facility];  Service: Orthopedics;  Laterality: Right;    Family History  Problem Relation Age of Onset  . Ovarian cancer Mother 81  . Heart failure Father   . Cancer Paternal Uncle        [facility]i Sarcoma  . Multiple myeloma Sister 80    Social History   Socioeconomic History  . Marital status: Married    Spouse name: Not on file  . Number of children: 2  . Years of education: Not on file  . Highest education level: Not on file  Occupational History  . Occupation: Retired Chief Executive Officer  Tobacco Use  . Smoking status: Former Smoker    Packs/day: 1.00    Years: 20.00    Pack years: 20.00    Types: Cigarettes    Quit date: 1988    Years since quitting: 33.9  . Smokeless tobacco: Never Used  Vaping Use  . Vaping Use: Never used  Substance and Sexual Activity  . Alcohol use: Yes     Alcohol/week: 0.0 standard drinks    Comment: social  . Drug use: No  . Sexual activity: Not on file  Other Topics Concern  . Not on file  Social History Narrative  . Not on file   Social Determinants of Health   Financial Resource Strain:   . Difficulty of Paying Living Expenses: Not on file  Food Insecurity:   . Worried About Charity fundraiser in the Last Year: Not on file  . Ran Out of Food in the Last Year: Not on file  Transportation Needs:   . Lack of Transportation (Medical): Not on file  . Lack of Transportation (Non-Medical): Not on file  Physical Activity:   . Days of Exercise per Week: Not on file  . Minutes of Exercise  per Session: Not on file  Stress:   . Feeling of Stress : Not on file  Social Connections:   . Frequency of Communication with Friends and Family: Not on file  . Frequency of Social Gatherings with Friends and Family: Not on file  . Attends Religious Services: Not on file  . Active Member of Clubs or Organizations: Not on file  . Attends Archivist Meetings: Not on file  . Marital Status: Not on file  Intimate Partner Violence:   . Fear of Current or Ex-Partner: Not on file  . Emotionally Abused: Not on file  . Physically Abused: Not on file  . Sexually Abused: Not on file    Current Outpatient Medications  Medication Sig Dispense Refill  . ACTOS 45 MG tablet Take 45 mg by mouth daily with supper.   1  . aspirin EC 81 MG tablet Take 1 tablet (81 mg total) by mouth daily. 90 tablet 3  . Cholecalciferol (VITAMIN D3 SUPER STRENGTH) 50 MCG (2000 UT) TABS Take 2,000 Units by mouth daily.    . Empagliflozin (JARDIANCE) 10 MG TABS Take 10 mg by mouth daily.     . Glucosamine-Chondroitin (GLUCOSAMINE CHONDR COMPLEX PO) Take 1 capsule by mouth 2 (two) times daily.    Marland Kitchen glyBURIDE-metformin (GLUCOVANCE) 5-500 MG per tablet Take 2 tablets by mouth 2 (two) times daily with a meal.    . metoprolol succinate (TOPROL-XL) 50 MG 24 hr tablet Take  50 mg by mouth at bedtime. Take with or immediately following a meal.     . Multiple Vitamin (MULTIVITAMIN WITH MINERALS) TABS tablet Take 1 tablet by mouth daily. Centrum silver    . omega-3 acid ethyl esters (LOVAZA) 1 G capsule Take 1 g by mouth 2 (two) times daily.     Marland Kitchen omeprazole (PRILOSEC) 20 MG capsule Take 20 mg by mouth 2 (two) times a week. Mondays & Thursdays.    . ONGLYZA 5 MG TABS tablet Take 5 mg by mouth daily with supper.   2  . ramipril (ALTACE) 10 MG capsule Take 10 mg by mouth daily with supper.     Marland Kitchen VYTORIN 10-20 MG tablet TAKE 1 TABLET BY MOUTH DAILY. (Patient taking differently: Take 1 tablet by mouth daily with supper. ) 90 tablet 3   No current facility-administered medications for this visit.    Allergies  Allergen Reactions  . Other Other (See Comments)    Agent: Vit C,e,zn,cu-omega3-lut-zeax Reaction: GI upset Agent: Vit C,e,zn,cu-omega3-lut-zeax Reaction: GI upset      Review of Systems:   General:  normal appetite, normal energy, no weight gain, no weight loss, no fever  Cardiac:  + chest burning pain with exertion, no chest pain at rest, no SOB with exertion, no resting SOB, no PND, no orthopnea, no palpitations, no arrhythmia, no atrial fibrillation, no LE edema, no dizzy spells, no syncope  Respiratory:  no shortness of breath, no home oxygen, no productive cough, no dry cough, no bronchitis, no wheezing, no hemoptysis, no asthma, no pain with inspiration or cough, no sleep apnea, no CPAP at night  GI:   no difficulty swallowing, no reflux, no frequent heartburn, no hiatal hernia, no abdominal pain, no constipation, no diarrhea, no hematochezia, n hematemesis, no melena  GU:   no dysuria,  no frequency, no urinary tract infection, no hematuria, no enlarged prostate, no kidney stones, no kidney disease  Vascular:  no pain suggestive of claudication, no pain in feet, no leg cramps,  no varicose veins, no DVT, no non-healing foot ulcer  Neuro:   no stroke,  no TIA's, no seizures, no headaches, no temporary blindness one eye,  no slurred speech, no peripheral neuropathy, no chronic pain, no instability of gait, no memory/cognitive dysfunction  Musculoskeletal: no arthritis, no joint swelling, no myalgias, no difficulty walking, normal mobility   Skin:   no rash, no itching, no skin infections, no pressure sores or ulcerations  Psych:   no anxiety, no depression, no nervousness, no unusual recent stress  Eyes:   no blurry vision, no floaters, no recent vision changes, + wears glasses   ENT:   no hearing loss, no loose or painful teeth, no dentures, last saw dentist this year  Hematologic:  no easy bruising, no abnormal bleeding, no clotting disorder, no frequent epistaxis  Endocrine:  + diabetes, does check CBG's at home           Physical Exam:   BP 128/73   Pulse 76   Temp 97.7 F (36.5 C) (Skin)   Resp 20   Ht $R'5\' 10"'OD$  (1.778 m)   Wt 180 lb (81.6 kg)   SpO2 97% Comment: RA  BMI 25.83 kg/m   General:             Well-appearing  HEENT:  Unremarkable, NCAT, PERLA, EOMI  Neck:   no JVD, no bruits, no adenopathy   Chest:   clear to auscultation, symmetrical breath sounds, no wheezes, no rhonchi   CV:   RRR, grade lll/VI crescendo/decrescendo murmur heard best at RSB,  no diastolic murmur  Abdomen:  soft, non-tender, no masses   Extremities:  warm, well-perfused, pulses palpable at ankle, no LE edema  Rectal/GU  Deferred  Neuro:   Grossly non-focal and symmetrical throughout  Skin:   Clean and dry, no rashes, no breakdown   Diagnostic Tests:  ECHOCARDIOGRAM REPORT       Patient Name:  MATIS MONNIER Date of Exam: 08/02/2019  Medical Rec #: [id number]    Height:    70.0 in  Accession #:  [id number]    Weight:    185.1 lb  Date of Birth: April 16, 1940    BSA:     2.020 m  Patient Age:  21 years     BP:      112/56 mmHg  Patient Gender: M        HR:      65 bpm.  Exam Location:  [address]   Procedure: 2D Echo, 3D Echo, Cardiac Doppler, Color Doppler and Strain  Analysis   Indications:  I35 Aortic stenosis.    History:    Patient has prior history of Echocardiogram examinations,  most         recent 01/13/2019. CAD, Aortic Valve Disease,  Arrythmias:RBBB;         Risk Factors:Hypertension, Diabetes, Dyslipidemia, Former  Smoker         and Obesity. CKD stage 3.    Sonographer:  Jessee Avers, RDCS  Referring Phys: [place]    1. Normal LV systolic function; grade 1 DD; mildly dilated aortic root;  severe AS (peak velocity 4.3 m/s; mean gradient 36 mmHg).  2. Left ventricular ejection fraction, by estimation, is 60 to 65%. The  left ventricle has normal function. The left ventricle has no regional  wall motion abnormalities. Left ventricular diastolic parameters are  consistent with Grade I diastolic  dysfunction (impaired relaxation). Elevated left atrial pressure.  3. Right ventricular systolic function is normal. The right ventricular  size is normal.  4. The mitral valve is normal in structure. Trivial mitral valve  regurgitation. No evidence of mitral stenosis.  5. The aortic valve has an indeterminant number of cusps. Aortic valve  regurgitation is not visualized. Severe aortic valve stenosis.  6. Aortic dilatation noted. There is mild dilatation of the aortic root  measuring 38 mm.  7. The inferior vena cava is normal in size with greater than 50%  respiratory variability, suggesting right atrial pressure of 3 mmHg.   Comparison(s): 01/13/19 EF 60-65%. Severe AS.   FINDINGS  Left Ventricle: Left ventricular ejection fraction, by estimation, is 60  to 65%. The left ventricle has normal function. The left ventricle has no  regional wall motion abnormalities. The left ventricular internal cavity  size was normal in size. There is  no left ventricular hypertrophy.  Left ventricular diastolic parameters  are consistent with Grade I diastolic dysfunction (impaired relaxation).  Elevated left atrial pressure.   Right Ventricle: The right ventricular size is normal.Right ventricular  systolic function is normal.   Left Atrium: Left atrial size was normal in size.   Right Atrium: Right atrial size was normal in size.   Pericardium: There is no evidence of pericardial effusion.   Mitral Valve: The mitral valve is normal in structure. Normal mobility of  the mitral valve leaflets. Trivial mitral valve regurgitation. No evidence  of mitral valve stenosis.   Tricuspid Valve: The tricuspid valve is normal in structure. Tricuspid  valve regurgitation is trivial. No evidence of tricuspid stenosis.   Aortic Valve: The aortic valve has an indeterminant number of cusps.  Aortic valve regurgitation is not visualized. Severe aortic stenosis is  present. Aortic valve mean gradient measures 36.0 mmHg. Aortic valve peak  gradient measures 73.6 mmHg. Aortic  valve area, by VTI measures 0.86 cm.   Pulmonic Valve: The pulmonic valve was normal in structure. Pulmonic valve  regurgitation is trivial. No evidence of pulmonic stenosis.   Aorta: Aortic dilatation noted. There is mild dilatation of the aortic  root measuring 38 mm.   Venous: The inferior vena cava is normal in size with greater than 50%  respiratory variability, suggesting right atrial pressure of 3 mmHg.   IAS/Shunts: The interatrial septum is aneurysmal. No atrial level shunt  detected by color flow Doppler.   Additional Comments: Normal LV systolic function; grade 1 DD; mildly  dilated aortic root; severe AS (peak velocity 4.3 m/s; mean gradient 36  mmHg).     LEFT VENTRICLE  PLAX 2D  LVIDd:     4.20 cm Diastology  LVIDs:     2.80 cm LV e' lateral:  6.00 cm/s  LV PW:     1.00 cm LV E/e' lateral: 15.1  LV IVS:    0.90 cm LV e' medial:  4.95 cm/s  LVOT diam:    2.00 cm LV E/e' medial: 18.3  LV SV:     82  LV SV Index:  41    2D Longitudinal Strain  LVOT Area:   3.14 cm 2D Strain GLS (A2C):  -27.5 %             2D Strain GLS (A3C):  -23.0 %             2D Strain GLS (A4C):  16.6 %             2D Strain GLS Avg:   -22.4 %  3D Volume EF:             3D EF:    58 %             LV EDV:    116 ml             LV ESV:    49 ml             LV SV:    67 ml   RIGHT VENTRICLE  RV Basal diam: 2.80 cm  RV S prime:   10.70 cm/s  TAPSE (M-mode): 2.2 cm   LEFT ATRIUM       Index    RIGHT ATRIUM      Index  LA diam:    3.90 cm 1.93 cm/m RA Area:   11.40 cm  LA Vol (A2C):  62.6 ml 30.99 ml/m RA Volume:  22.20 ml 10.99 ml/m  LA Vol (A4C):  49.9 ml 24.70 ml/m  LA Biplane Vol: 56.0 ml 27.72 ml/m  AORTIC VALVE  AV Area (Vmax):  0.75 cm  AV Area (Vmean):  0.77 cm  AV Area (VTI):   0.86 cm  AV Vmax:      429.00 cm/s  AV Vmean:     276.000 cm/s  AV VTI:      0.958 m  AV Peak Grad:   73.6 mmHg  AV Mean Grad:   36.0 mmHg  LVOT Vmax:     102.00 cm/s  LVOT Vmean:    67.600 cm/s  LVOT VTI:     0.262 m  LVOT/AV VTI ratio: 0.27    AORTA  Ao Root diam: 3.80 cm  Ao Asc diam: 3.70 cm   MITRAL VALVE               SHUNTS               Systemic VTI: 0.26 m  MV E velocity: 90.80 cm/s  Systemic Diam: 2.00 cm  MV A velocity: 114.00 cm/s  MV E/A ratio: 0.80   Kirk Ruths MD  Electronically signed by Kirk Ruths MD  Signature Date/Time: 08/02/2019/1:28:37 PM     Physicians  Panel Physicians Referring Physician Case Authorizing Physician  Burnell Blanks, MD (Primary)    Procedures  INTRAVASCULAR PRESSURE WIRE/FFR STUDY  RIGHT/LEFT HEART CATH AND CORONARY ANGIOGRAPHY   Conclusion    2nd Diag lesion is 95% stenosed.  Ost LAD to Mid LAD lesion is 40% stenosed.  Mid RCA to Dist RCA lesion is 65% stenosed.  Mid LAD lesion is 25% stenosed.  Previously placed 1st Mrg stent (unknown type) is widely patent.  Previously placed Ost Cx to Prox Cx stent (unknown type) is widely patent.   1. Moderate stenosis in the mid to distal RCA. Angiographically this is an eccentric lesion. DFR of the lesion in the mid to distal RCA was 0.97 suggesting the lesion was not flow limiting.  2. Patent mid Circumflex stents into the OM 3. Patent mid LAD stent. Jailed small Diagonal by the mid LAD stent.  4. Severe aortic stenosis ( mean gradient 44.6 mmHg, peak to peak gradient 49 mmHg, AVA 0.8 cm2).   Recommendations: Medical management of CAD. Continue workup for TAVR.  He will be referred to see Dr. Cyndia Bent on 11/30/19.     Recommendations  Antiplatelet/Anticoag Medical management of CAD Continue workup for TAVR. We will arrange a gated cardiac CT, CTA of the chest/abdomen and pelvis and carotid artery dopplers. He will then be  referred to see one of the CT surgeons on our structural heart team.  Indications  Severe aortic stenosis [I35.0 (ICD-10-CM)]  Coronary artery disease involving native coronary artery of native heart with other form of angina pectoris ([place]) [X32.440 (ICD-10-CM)]  Procedural Details  Technical Details Indication: 79 yo male with history of severe aortic stenosis, CAD with recent worsened dyspnea and chest discomfort.   Procedure: The risks, benefits, complications, treatment options, and expected outcomes were discussed with the patient. The patient and/or family concurred with the proposed plan, giving informed consent. The patient was brought to the cath lab after IV hydration was given. The patient was sedated with Versed and Fentanyl. The IV catheter in the right antecubital vein was changed for a 5 [place] sheath. Right heart  catheterization performed with a balloon tipped catheter. The right wrist was prepped and draped in a sterile fashion. 1% lidocaine was used for local anesthesia. Using the modified Seldinger access technique, a 5 French sheath was placed in the right radial artery. 3 mg Verapamil was given through the sheath. 5000 units IV heparin was given. Standard diagnostic catheters were used to perform selective coronary angiography. I crossed the aortic valve with an AL-1 catheter and a straight wire. LV pressures measured.   Pressure wire analysis: IV heparin given for anti-coagulation. ACT over 240. I engaged the RCA with a JR4 guiding catheter and passed a Comet pressure wire down the RCA. DFR 0.97 suggesting the mid to distal lesion was not flow limiting.   The sheath was removed from the right radial artery and a Terumo hemostasis band was applied at the arteriotomy site on the right wrist.    Estimated blood loss <50 mL.   During this procedure medications were administered to achieve and maintain moderate conscious sedation while the patient's heart rate, blood pressure, and oxygen saturation were continuously monitored and I was present face-to-face 100% of this time.  Medications (Filter: Administrations occurring from 1306 to 1449 on 11/15/19) Heparin (Porcine) in NaCl 1000-0.9 UT/500ML-% SOLN (mL) Total volume:  1,000 mL Date/Time  Rate/Dose/Volume Action  11/15/19 1319  500 mL Given  1320  500 mL Given    fentaNYL (SUBLIMAZE) injection (mcg) Total dose:  50 mcg Date/Time  Rate/Dose/Volume Action  11/15/19 1322  25 mcg Given  1415  25 mcg Given    midazolam (VERSED) injection (mg) Total dose:  3 mg Date/Time  Rate/Dose/Volume Action  11/15/19 1322  2 mg Given  1415  1 mg Given    lidocaine (PF) (XYLOCAINE) 1 % injection (mL) Total volume:  2 mL Date/Time  Rate/Dose/Volume Action  11/15/19 1336  2 mL Given    Radial Cocktail/Verapamil only (mL) Total volume:  10 mL Date/Time   Rate/Dose/Volume Action  11/15/19 1344  10 mL Given    heparin sodium (porcine) injection (Units) Total dose:  10,000 Units Date/Time  Rate/Dose/Volume Action  11/15/19 1345  5,000 Units Given  1350  5,000 Units Given    iohexol (OMNIPAQUE) 350 MG/ML injection (mL) Total volume:  55 mL Date/Time  Rate/Dose/Volume Action  11/15/19 1433  55 mL Given    Sedation Time  Sedation Time Physician-1: 1 hour 4 minutes 32 seconds  Contrast  Medication Name Total Dose  iohexol (OMNIPAQUE) 350 MG/ML injection 55 mL    Radiation/Fluoro  Fluoro time: 11 (min) DAP: 20.1 (Gycm2) Cumulative Air Kerma: 102.7 (mGy)  Complications  Complications documented before study signed (11/15/2019 3:06 PM)     Log Level Complications  None  Documented by Burnell Blanks, MD 11/15/2019 2:56 PM  Date Found: 11/15/2019  Time Range: Intraprocedure    Coronary Findings  Diagnostic Dominance: Right Left Anterior Descending  There is mild diffuse disease throughout the vessel.  Ost LAD to Mid LAD lesion is 40% stenosed.  Mid LAD lesion is 25% stenosed. The lesion was previously treated using a stent (unknown type) over 2 years ago.  Second Diagonal Branch  Vessel is small in size.  2nd Diag lesion is 95% stenosed.  Left Circumflex  Previously placed Ost Cx to Prox Cx stent (unknown type) is widely patent.  First Obtuse Marginal Branch  Previously placed 1st Mrg stent (unknown type) is widely patent.  Right Coronary Artery  There is moderate diffuse disease throughout the vessel.  Mid RCA to Dist RCA lesion is 65% stenosed. Pressure wire/FFR was performed on the lesion. DFR 0.97.  Intervention  No interventions have been documented. Coronary Diagrams  Diagnostic Dominance: Right  Intervention  Implants   No implant documentation for this case.  Syngo Images  Show images for CARDIAC CATHETERIZATION Images on Long Term Storage  Show images for [place], [place] to  Procedure Log  Procedure Log    Hemo Data   Most Recent Value  Fick Cardiac Output 5.05 L/min  Fick Cardiac Output Index 2.53 (L/min)/BSA  Aortic Mean Gradient 44.65 mmHg  Aortic Peak Gradient 49 mmHg  Aortic Valve Area 0.80  Aortic Value Area Index 0.4 cm2/BSA  RA A Wave 3 mmHg  RA V Wave 1 mmHg  RA Mean 0 mmHg  RV Systolic Pressure 19 mmHg  RV Diastolic Pressure -1 mmHg  RV EDP 2 mmHg  PA Systolic Pressure 17 mmHg  PA Diastolic Pressure 2 mmHg  PA Mean 7 mmHg  PW A Wave 4 mmHg  PW V Wave 5 mmHg  PW Mean 2 mmHg  AO Systolic Pressure 397 mmHg  AO Diastolic Pressure 43 mmHg  AO Mean 66 mmHg  LV Systolic Pressure 673 mmHg  LV Diastolic Pressure 0 mmHg  LV EDP 4 mmHg  AOp Systolic Pressure 419 mmHg  AOp Diastolic Pressure 41 mmHg  AOp Mean Pressure 64 mmHg  LVp Systolic Pressure 379 mmHg  LVp Diastolic Pressure 0 mmHg  LVp EDP Pressure 6 mmHg  QP/QS 1  TPVR Index 2.77 HRUI  TSVR Index 26.14 HRUI  TPVR/TSVR Ratio 0.11    ADDENDUM REPORT: 11/09/2019 14:24  CLINICAL DATA:  Aortic stenosis  EXAM: Cardiac TAVR CT  TECHNIQUE: The patient was scanned on a Siemens Force 024 slice scanner. A 120 kV retrospective scan was triggered in the descending thoracic aorta at 111 HU's. Gantry rotation speed was 270 msecs and collimation was .9 mm. No beta blockade or nitro were given. The 3D data set was reconstructed in 5% intervals of the R-R cycle. Systolic and diastolic phases were analyzed on a dedicated work station using MPR, MIP and VRT modes. The patient received 80 cc of contrast.  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 calcified with restricted motion AV calcium score 2964  Aorta:  Sino-tubular Junction: 25 mm  Ascending Thoracic Aorta: 32 mm  Aortic Arch: 23 mm  Descending Thoracic Aorta: 22 mm  Sinus of Valsalva Measurements:  Non-coronary: 32.7 mm  Right - coronary: 33.4 mm  Left - coronary:  33.8 mm  Coronary Artery Height above Annulus:  Left Main: 12.5 mm above annulus  Right Coronary: 15.8 mm above annulus  Virtual Basal Annulus Measurements:  Maximum/Minimum Diameter: 27.2 mm x 20.5 mm  Perimeter: 78.7 mm  Area: Area 443 mm2  Coronary Arteries: Sufficient height above annulus for deployment  Optimum Fluoroscopic Angle for Delivery: RAO 6 Caudal 16 degrees  IMPRESSION: 1. 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 AV calcium score 2964 consistent with severe AS. Significant annular calcification at base of right and non commissure as well as Calcification of the intervalvular fibrosa  3.  Coronary arteries sufficient height above annulus for deployment  4. Optimum angiographic angle for deployment RAO 5 Caudal 16 degrees  5.  Normal aortic root 3.2 cm  Jenkins Rouge   Electronically Signed   By: Jenkins Rouge M.D.   On: 11/09/2019 14:24   Addended by Josue Hector, MD on 11/09/2019 2:26 PM  Study Result  Addenda  ADDENDUM REPORT: 11/09/2019 14:24  CLINICAL DATA:  Aortic stenosis  EXAM: Cardiac TAVR CT  TECHNIQUE: The patient was scanned on a Siemens Force 697 slice scanner. A 120 kV retrospective scan was triggered in the descending thoracic aorta at 111 HU's. Gantry rotation speed was 270 msecs and collimation was .9 mm. No beta blockade or nitro were given. The 3D data set was reconstructed in 5% intervals of the R-R cycle. Systolic and diastolic phases were analyzed on a dedicated work station using MPR, MIP and VRT modes. The patient received 80 cc of contrast.  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 calcified with restricted motion AV calcium score 2964  Aorta:  Sino-tubular Junction: 25 mm  Ascending Thoracic Aorta: 32 mm  Aortic Arch: 23 mm  Descending Thoracic Aorta: 22 mm  Sinus of Valsalva  Measurements:  Non-coronary: 32.7 mm  Right - coronary: 33.4 mm  Left - coronary: 33.8 mm  Coronary Artery Height above Annulus:  Left Main: 12.5 mm above annulus  Right Coronary: 15.8 mm above annulus  Virtual Basal Annulus Measurements:  Maximum/Minimum Diameter: 27.2 mm x 20.5 mm  Perimeter: 78.7 mm  Area: Area 443 mm2  Coronary Arteries: Sufficient height above annulus for deployment  Optimum Fluoroscopic Angle for Delivery: RAO 6 Caudal 16 degrees  IMPRESSION: 1. 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 AV calcium score 2964 consistent with severe AS. Significant annular calcification at base of right and non commissure as well as Calcification of the intervalvular fibrosa  3.  Coronary arteries sufficient height above annulus for deployment  4. Optimum angiographic angle for deployment RAO 5 Caudal 16 degrees  5.  Normal aortic root 3.2 cm  Jenkins Rouge   Electronically Signed   By: Jenkins Rouge M.D.   On: 11/09/2019 14:24   Signed by Josue Hector, MD on 11/09/2019 2:26 PM  Narrative & Impression  EXAM: OVER-READ INTERPRETATION  CT CHEST  The following report is an over-read performed by radiologist Dr. Vinnie Langton of [facility], [place] on 11/09/2019. This over-read does not include interpretation of cardiac or coronary anatomy or pathology. The coronary calcium score/coronary CTA interpretation by the cardiologist is attached.  COMPARISON:  None.  FINDINGS: Extracardiac findings will be described separately under dictation for contemporaneously obtained CTA chest, abdomen and pelvis.  IMPRESSION: Please see separate dictation for contemporaneously obtained CTA chest, abdomen and pelvis dated 11/09/2019 for full description of relevant extracardiac findings.  Electronically Signed: By: Vinnie Langton M.D. On: 11/09/2019 10:54  Narrative &  Impression  CLINICAL DATA:  79 year old male with history of severe aortic stenosis. Preprocedural study prior to potential transcatheter aortic valve replacement (TAVR) procedure.  EXAM: CT ANGIOGRAPHY CHEST, ABDOMEN AND PELVIS  TECHNIQUE: Multidetector CT imaging through the chest, abdomen and pelvis was performed using the standard protocol during bolus administration of intravenous contrast. Multiplanar reconstructed images and MIPs were obtained and reviewed to evaluate the vascular anatomy.  CONTRAST:  135mL OMNIPAQUE IOHEXOL 350 MG/ML SOLN  COMPARISON:  No priors.  FINDINGS: CTA CHEST FINDINGS  Cardiovascular: Heart size is normal. There is no significant pericardial fluid, thickening or pericardial calcification. There is aortic atherosclerosis, as well as atherosclerosis of the great vessels of the mediastinum and the coronary arteries, including calcified atherosclerotic plaque in the left main, left anterior descending, left circumflex and right coronary arteries. Severe thickening calcification of the aortic valve.  Mediastinum/Lymph Nodes: No pathologically enlarged mediastinal or hilar lymph nodes. Esophagus is unremarkable in appearance. No axillary lymphadenopathy.  Lungs/Pleura: Multiple small pulmonary nodules scattered throughout the lungs bilaterally, largest of which measures only 4 mm in the right lower lobe (axial image 70 of series 8). No other larger more suspicious appearing pulmonary nodules or masses are noted. No acute consolidative airspace disease. No pleural effusions.  Musculoskeletal/Soft Tissues: There are no aggressive appearing lytic or blastic lesions noted in the visualized portions of the skeleton.  CTA ABDOMEN AND PELVIS FINDINGS  Hepatobiliary: 4 mm hypervascular lesion in segment 2 of the liver (axial image 80 of series 7), too small to characterize, but statistically likely to represent a small flash fill  cavernous hemangioma or perfusion anomaly. No other larger more suspicious appearing hepatic lesions. No intra or extrahepatic biliary ductal dilatation. Tiny calcified gallstones lying dependently in the neck of the gallbladder. No findings to suggest an acute cholecystitis at this time.  Pancreas: No pancreatic mass. No pancreatic ductal dilatation. No pancreatic or peripancreatic fluid collections or inflammatory changes.  Spleen: Unremarkable.  Adrenals/Urinary Tract: Bilateral kidneys and bilateral adrenal glands are normal in appearance. No hydroureteronephrosis. Urinary bladder is normal in appearance.  Stomach/Bowel: Normal appearance of the stomach. No pathologic dilatation of small bowel or colon. The appendix is not confidently identified and may be surgically absent. Regardless, there are no inflammatory changes noted adjacent to the cecum to suggest the presence of an acute appendicitis at this time.  Vascular/Lymphatic: Aortic atherosclerosis, with vascular findings and measurements pertinent to potential TAVR procedure, as detailed below. No aneurysm or dissection noted in the abdominal or pelvic vasculature. No lymphadenopathy noted in the abdomen or pelvis.  Reproductive: Prostate gland is severely enlarged with median lobe hypertrophy measuring 6.6 x 6.5 x 6.7 cm. Seminal vesicles are unremarkable in appearance.  Other: No significant volume of ascites.  No pneumoperitoneum.  Musculoskeletal: There are no aggressive appearing lytic or blastic lesions noted in the visualized portions of the skeleton.  VASCULAR MEASUREMENTS PERTINENT TO TAVR:  AORTA:  Minimal Aortic Diameter-12 x 12 mm  Severity of Aortic Calcification-moderate to severe  RIGHT PELVIS:  Right Common Iliac Artery -  Minimal Diameter-10.3 x 9.5 mm  Tortuosity-mild  Calcification-mild  Right External Iliac Artery -  Minimal Diameter-9.3 x 9.1  mm  Tortuosity-moderate  Calcification-none  Right Common Femoral Artery -  Minimal Diameter-8.0 x 7.2 mm  Tortuosity-mild  Calcification-mild  LEFT PELVIS:  Left Common Iliac Artery -  Minimal Diameter-8.7 x 9.3 mm  Tortuosity-mild  Calcification-mild  Left External Iliac Artery -  Minimal Diameter-8.4 x 8.8  mm  Tortuosity-moderate  Calcification-mild  Left Common Femoral Artery -  Minimal Diameter-8.2 x 7.5 mm  Tortuosity-mild  Calcification-mild  Review of the MIP images confirms the above findings.  IMPRESSION: 1. Vascular findings and measurements pertinent to potential TAVR procedure, as detailed above. 2. Severe thickening calcification of the aortic valve, compatible with reported clinical history of severe aortic stenosis. 3. Aortic atherosclerosis, in addition to left main and 3 vessel coronary artery disease. Assessment for potential risk factor modification, dietary therapy or pharmacologic therapy may be warranted, if clinically indicated. 4. Small pulmonary nodules scattered throughout the lungs bilaterally measuring 4 mm or less in size, nonspecific, but statistically benign. No follow-up needed if patient is low-risk (and has no known or suspected primary neoplasm). Non-contrast chest CT can be considered in 12 months if patient is high-risk. This recommendation follows the consensus statement: Guidelines for Management of Incidental Pulmonary Nodules Detected on CT Images: From the Fleischner Society 2017; Radiology 2017; 284:228-243. 5. 4 mm hypervascular lesion in segment 2 of the liver, nonspecific, but strongly favored to represent a benign lesions such as a small flash fill cavernous hemangioma. This could be definitively characterized with follow-up nonemergent abdominal MRI with and without IV gadolinium if of clinical concern. 6. Cholelithiasis without evidence of acute cholecystitis at this time. 7.  Prostatomegaly with median lobe hypertrophy. 8. Additional incidental findings, as above.   Electronically Signed   By: Vinnie Langton M.D.   On: 11/09/2019 13:05    STS Risk Score:  Risk of Mortality: 1.208% Renal Failure: 1.698% Permanent Stroke: 1.127% Prolonged Ventilation: 4.979% DSW Infection: 0.083% Reoperation: 3.707% Morbidity or Mortality: 8.754% Short Length of Stay: 47.025% Long Length of Stay: 3.740%  Impression:  This 79 year old gentleman has stage D, severe, symptomatic aortic stenosis with New York Heart Association class Il symptoms of exertional chest burning and mild shortness of breath with walking up hills.  I have personally reviewed his 2D echocardiogram, cardiac catheterization, and CTA studies.  2D echocardiogram shows an indeterminate number of cost with severely calcified leaflets and restricted mobility.  The mean gradient is 36 mmHg with a valve area of 0.86 cm consistent with severe aortic stenosis.  Left ventricular systolic function is normal.  Cardiac catheterization showed moderate nonobstructive coronary disease that can be managed medically.  The mean gradient across the aortic valve was measured at 44.6 mmHg consistent with severe aortic stenosis.  I agree that aortic valve replacement is indicated in this patient for relief of his symptoms and to prevent progressive left ventricular deterioration.  Given his age I think transcatheter aortic valve replacement would be a reasonable option for treating his severe aortic stenosis.  His gated cardiac CTA shows a functionally bicuspid aortic valve with fusion of the right and left cusp with heavy calcification of the leaflets and restricted motion.  Anatomy is suitable for TAVR although there is significant annular calcification at the base of the right and on commissure as well as calcification extending down into the intravalvular fibrosa which may increase the risk of perivalvular leak.   Abdominal and pelvic CTA shows adequate pelvic vascular anatomy to allow transfemoral insertion.  The patient and his wife were counseled at length regarding treatment alternatives for management of severe symptomatic aortic stenosis. The risks and benefits of surgical intervention has been discussed in detail. Long-term prognosis with medical therapy was discussed. Alternative approaches such as conventional surgical aortic valve replacement, transcatheter aortic valve replacement, and palliative medical therapy were compared and contrasted at  length. This discussion was placed in the context of the patient's own specific clinical presentation and past medical history. All of their questions have been addressed.  I reviewed the CTA images with the patient and his wife.  I discussed the calcification of his annulus and the increased risk of perivalvular leak.  He understands this and wants to proceed with transcatheter aortic valve replacement instead of open surgical AVR.  Following the decision to proceed with transcatheter aortic valve replacement, a discussion was held regarding what types of management strategies would be attempted intraoperatively in the event of life-threatening complications, including whether or not the patient would be considered a candidate for the use of cardiopulmonary bypass and/or conversion to open sternotomy for attempted surgical intervention. The patient is aware of the fact that transient use of cardiopulmonary bypass may be necessary.  I think he is a candidate for emergent sternotomy if needed to manage any intraoperative complications and I discussed that with the patient.  The patient has been advised of a variety of complications that might develop including but not limited to risks of death, stroke, paravalvular leak, aortic dissection or other major vascular complications, aortic annulus rupture, device embolization, cardiac rupture or perforation, mitral  regurgitation, acute myocardial infarction, arrhythmia, heart block or bradycardia requiring permanent pacemaker placement, congestive heart failure, respiratory failure, renal failure, pneumonia, infection, other late complications related to structural valve deterioration or migration, or other complications that might ultimately cause a temporary or permanent loss of functional independence or other long term morbidity. The patient provides full informed consent for the procedure as described and all questions were answered.    Plan:  He will be scheduled for transfemoral transcatheter aortic valve replacement using a SAPIEN 3 valve on 12/13/2019.  I spent 60 minutes performing this consultation and > 50% of this time was spent face to face counseling and coordinating the care of this patient's severe symptomatic aortic stenosis.     Gaye Pollack, MD 11/30/2019

## 2019-12-08 NOTE — Progress Notes (Signed)
[facility], [place] [address] Phone: [phone] Fax: [phone]      Your procedure is scheduled on Tuesday 12/13/2019.  Report to [facility] Main Entrance "A" at 05:30 A.M., and check in at the Admitting office.  Call this number if you have problems the morning of surgery:  [phone]  Call [phone] if you have any questions prior to your surgery date Monday-Friday 8am-4pm    Remember:  Do not eat or drink after midnight the night before your surgery    Stop taking Glucovance on 12/5. You will take your last dose on 12/4 (Saturday).    Stop taking Empagliflozin (Jardiance) on 12/6.  You will take your last dose on 12/5 (Sunday)   Continue taking all other medications including your Aspirin without change through the day before surgery.    On the morning of surgery do not take any medications.      HOW TO MANAGE YOUR DIABETES BEFORE AND AFTER SURGERY  Why is it important to control my blood sugar before and after surgery? . Improving blood sugar levels before and after surgery helps healing and can limit problems. . A way of improving blood sugar control is eating a healthy diet by: o  Eating less sugar and carbohydrates o  Increasing activity/exercise o  Talking with your doctor about reaching your blood sugar goals . High blood sugars (greater than 180 mg/dL) can raise your risk of infections and slow your recovery, so you will need to focus on controlling your diabetes during the weeks before surgery. . Make sure that the doctor who takes care of your diabetes knows about your planned surgery including the date and location.  How do I manage my blood sugar before surgery? . Check your blood sugar at least 4 times a day, starting 2 days before surgery, to make sure that the level is not too high or low. . Check your blood sugar the morning of your surgery when you wake up and  every 2 hours until you get to the Short Stay unit. o If your blood sugar is less than 70 mg/dL, you will need to treat for low blood sugar: - Do not take insulin. - Treat a low blood sugar (less than 70 mg/dL) with  cup of clear juice (cranberry or apple), 4 glucose tablets, OR glucose gel. - Recheck blood sugar in 15 minutes after treatment (to make sure it is greater than 70 mg/dL). If your blood sugar is not greater than 70 mg/dL on recheck, call [phone] for further instructions. . Report your blood sugar to the short stay nurse when you get to Short Stay.  . If you are admitted to the hospital after surgery: o Your blood sugar will be checked by the staff and you will probably be given insulin after surgery (instead of oral diabetes medicines) to make sure you have good blood sugar levels. o The goal for blood sugar control after surgery is 80-180 mg/dL.             Do not wear jewelry            Do not wear lotions, powders, colognes, or deodorant.            Do not shave 48 hours prior to surgery.  Men may shave face and neck.            Do not bring valuables to the hospital.  [facility] is not responsible for any belongings or valuables.  Do NOT Smoke (Tobacco/Vaping) or drink Alcohol 24 hours prior to your procedure  If you use a CPAP at night, you may bring all equipment for your overnight stay.   Contacts, glasses, dentures or bridgework may not be worn into surgery.      For patients admitted to the hospital, discharge time will be determined by your treatment team.   Patients discharged the day of surgery will not be allowed to drive home, and someone needs to stay with them for 24 hours.    Special instructions:   [facility]- Preparing For Surgery  Before surgery, you can play an important role. Because skin is not sterile, your skin needs to be as free of germs as possible. You can reduce the number of germs on your skin by washing with CHG  (chlorahexidine gluconate) Soap before surgery.  CHG is an antiseptic cleaner which kills germs and bonds with the skin to continue killing germs even after washing.    Oral Hygiene is also important to reduce your risk of infection.  Remember - BRUSH YOUR TEETH THE MORNING OF SURGERY WITH YOUR REGULAR TOOTHPASTE  Please do not use if you have an allergy to CHG or antibacterial soaps. If your skin becomes reddened/irritated stop using the CHG.  Do not shave (including legs and underarms) for at least 48 hours prior to first CHG shower. It is OK to shave your face.  Please follow these instructions carefully.   1. Shower the NIGHT BEFORE SURGERY and the MORNING OF SURGERY with CHG Soap.   2. If you chose to wash your hair, wash your hair first as usual with your normal shampoo.  3. After you shampoo, rinse your hair and body thoroughly to remove the shampoo.  4. Use CHG as you would any other liquid soap. You can apply CHG directly to the skin and wash gently with a scrungie or a clean washcloth.   5. Apply the CHG Soap to your body ONLY FROM THE NECK DOWN.  Do not use on open wounds or open sores. Avoid contact with your eyes, ears, mouth and genitals (private parts). Wash Face and genitals (private parts)  with your normal soap.   6. Wash thoroughly, paying special attention to the area where your surgery will be performed.  7. Thoroughly rinse your body with warm water from the neck down.  8. DO NOT shower/wash with your normal soap after using and rinsing off the CHG Soap.  9. Pat yourself dry with a CLEAN TOWEL.  10. Wear CLEAN PAJAMAS to bed the night before surgery  11. Place CLEAN SHEETS on your bed the night of your first shower and DO NOT SLEEP WITH PETS.   Day of Surgery: Shower with CHG soap as directed Wear Clean/Comfortable clothing the morning of surgery Do not apply any deodorants/lotions.   Remember to brush your teeth WITH YOUR REGULAR TOOTHPASTE.   Please read  over the following fact sheets that you were given.

## 2019-12-09 ENCOUNTER — Ambulatory Visit (HOSPITAL_COMMUNITY)
Admission: RE | Admit: 2019-12-09 | Discharge: 2019-12-09 | Disposition: A | Discharge status: Home / Self Care | Payer: Medicare Other | Source: Ambulatory Visit | Attending: Cardiovascular Disease | Admitting: Cardiovascular Disease

## 2019-12-09 ENCOUNTER — Other Ambulatory Visit: Payer: Self-pay

## 2019-12-09 ENCOUNTER — Encounter (HOSPITAL_COMMUNITY): Payer: Self-pay

## 2019-12-09 ENCOUNTER — Encounter (HOSPITAL_COMMUNITY)
Admission: RE | Admit: 2019-12-09 | Discharge: 2019-12-09 | Disposition: A | Discharge status: Home / Self Care | Payer: Medicare Other | Source: Ambulatory Visit | Attending: Cardiovascular Disease | Admitting: Cardiovascular Disease

## 2019-12-09 DIAGNOSIS — Z7982 Long term (current) use of aspirin: Secondary | ICD-10-CM | POA: Insufficient documentation

## 2019-12-09 DIAGNOSIS — N183 Chronic kidney disease, stage 3 unspecified: Secondary | ICD-10-CM | POA: Diagnosis not present

## 2019-12-09 DIAGNOSIS — Z01818 Encounter for other preprocedural examination: Secondary | ICD-10-CM | POA: Diagnosis not present

## 2019-12-09 DIAGNOSIS — E785 Hyperlipidemia, unspecified: Secondary | ICD-10-CM | POA: Diagnosis not present

## 2019-12-09 DIAGNOSIS — I4510 Unspecified right bundle-branch block: Secondary | ICD-10-CM | POA: Insufficient documentation

## 2019-12-09 DIAGNOSIS — Z87891 Personal history of nicotine dependence: Secondary | ICD-10-CM | POA: Insufficient documentation

## 2019-12-09 DIAGNOSIS — Z7984 Long term (current) use of oral hypoglycemic drugs: Secondary | ICD-10-CM | POA: Diagnosis not present

## 2019-12-09 DIAGNOSIS — I350 Nonrheumatic aortic (valve) stenosis: Secondary | ICD-10-CM | POA: Insufficient documentation

## 2019-12-09 DIAGNOSIS — E1122 Type 2 diabetes mellitus with diabetic chronic kidney disease: Secondary | ICD-10-CM | POA: Insufficient documentation

## 2019-12-09 DIAGNOSIS — D649 Anemia, unspecified: Secondary | ICD-10-CM | POA: Insufficient documentation

## 2019-12-09 DIAGNOSIS — I129 Hypertensive chronic kidney disease with stage 1 through stage 4 chronic kidney disease, or unspecified chronic kidney disease: Secondary | ICD-10-CM | POA: Diagnosis not present

## 2019-12-09 DIAGNOSIS — Z85828 Personal history of other malignant neoplasm of skin: Secondary | ICD-10-CM | POA: Insufficient documentation

## 2019-12-09 DIAGNOSIS — M47814 Spondylosis without myelopathy or radiculopathy, thoracic region: Secondary | ICD-10-CM | POA: Diagnosis not present

## 2019-12-09 DIAGNOSIS — I252 Old myocardial infarction: Secondary | ICD-10-CM | POA: Insufficient documentation

## 2019-12-09 DIAGNOSIS — Z79899 Other long term (current) drug therapy: Secondary | ICD-10-CM | POA: Insufficient documentation

## 2019-12-09 DIAGNOSIS — Z955 Presence of coronary angioplasty implant and graft: Secondary | ICD-10-CM | POA: Diagnosis not present

## 2019-12-09 DIAGNOSIS — I2510 Atherosclerotic heart disease of native coronary artery without angina pectoris: Secondary | ICD-10-CM | POA: Diagnosis not present

## 2019-12-09 HISTORY — DX: Elevated white blood cell count, unspecified: D72.829

## 2019-12-09 LAB — URINALYSIS, ROUTINE W REFLEX MICROSCOPIC
Bacteria, UA: NONE SEEN
Bilirubin Urine: NEGATIVE
Glucose, UA: >=500 mg/dL — AB
Hgb urine dipstick: NEGATIVE
Ketones, ur: NEGATIVE mg/dL
Leukocytes,Ua: NEGATIVE
Nitrite: NEGATIVE
Protein, ur: NEGATIVE mg/dL
Specific Gravity, Urine: 1.018 (ref 1.005–1.030)
pH: 5 (ref 5.0–8.0)

## 2019-12-09 LAB — CBC
HCT: 38.2 % — ABNORMAL LOW (ref 39.0–52.0)
Hemoglobin: 12.3 g/dL — ABNORMAL LOW (ref 13.0–17.0)
MCH: 30.4 pg (ref 26.0–34.0)
MCHC: 32.2 g/dL (ref 30.0–36.0)
MCV: 94.3 fL (ref 80.0–100.0)
Platelets: 214 10*3/uL (ref 150–400)
RBC: 4.05 MIL/uL — ABNORMAL LOW (ref 4.22–5.81)
RDW: 13.4 % (ref 11.5–15.5)
WBC: 17.8 10*3/uL — ABNORMAL HIGH (ref 4.0–10.5)
nRBC: 0 % (ref 0.0–0.2)

## 2019-12-09 LAB — PROTIME-INR
INR: 1 (ref 0.8–1.2)
Prothrombin Time: 12.4 seconds (ref 11.4–15.2)

## 2019-12-09 LAB — BLOOD GAS, ARTERIAL
Acid-base deficit: 5.8 mmol/L — ABNORMAL HIGH (ref 0.0–2.0)
Bicarbonate: 18.6 mmol/L — ABNORMAL LOW (ref 20.0–28.0)
Drawn by: 602861
FIO2: 21
O2 Saturation: 96.9 %
Patient temperature: 37
pCO2 arterial: 33.9 mmHg (ref 32.0–48.0)
pH, Arterial: 7.36 (ref 7.350–7.450)
pO2, Arterial: 93.9 mmHg (ref 83.0–108.0)

## 2019-12-09 LAB — COMPREHENSIVE METABOLIC PANEL
ALT: 19 U/L (ref 0–44)
AST: 22 U/L (ref 15–41)
Albumin: 4 g/dL (ref 3.5–5.0)
Alkaline Phosphatase: 53 U/L (ref 38–126)
Anion gap: 11 (ref 5–15)
BUN: 36 mg/dL — ABNORMAL HIGH (ref 8–23)
CO2: 16 mmol/L — ABNORMAL LOW (ref 22–32)
Calcium: 9.4 mg/dL (ref 8.9–10.3)
Chloride: 111 mmol/L (ref 98–111)
Creatinine, Ser: 1.68 mg/dL — ABNORMAL HIGH (ref 0.61–1.24)
GFR, Estimated: 41 mL/min — ABNORMAL LOW (ref >60)
Glucose, Bld: 249 mg/dL — ABNORMAL HIGH (ref 70–99)
Potassium: 4.7 mmol/L (ref 3.5–5.1)
Sodium: 138 mmol/L (ref 135–145)
Total Bilirubin: 0.5 mg/dL (ref 0.3–1.2)
Total Protein: 7.2 g/dL (ref 6.5–8.1)

## 2019-12-09 LAB — HEMOGLOBIN A1C
Hgb A1c MFr Bld: 8.3 % — ABNORMAL HIGH (ref 4.8–5.6)
Mean Plasma Glucose: 191.51 mg/dL

## 2019-12-09 LAB — BRAIN NATRIURETIC PEPTIDE: B Natriuretic Peptide: 81.8 pg/mL (ref 0.0–100.0)

## 2019-12-09 LAB — APTT: aPTT: 29 seconds (ref 24–36)

## 2019-12-09 LAB — GLUCOSE, CAPILLARY: Glucose-Capillary: 216 mg/dL — ABNORMAL HIGH (ref 70–99)

## 2019-12-09 LAB — SURGICAL PCR SCREEN
MRSA, PCR: NEGATIVE
Staphylococcus aureus: NEGATIVE

## 2019-12-09 NOTE — Progress Notes (Addendum)
PCP - Dr. Mertha Finders Cardiologist - dr. Daneen Schick  Chest x-ray - 12/09/19 EKG - 11/15/19 Stress Test - 02/10/18 ECHO - 08/02/19 Cardiac Cath - 11/15/19  DM - Type II Fasting Blood Sugar - 120-200 Checks Blood Sugar __infrequently CBG at PAT appt 216  Blood Thinner Instructions:Denies Aspirin Instructions:Will continue through day before surgery  COVID TEST- 12/12/19  Anesthesia review: Yes cardiac history  Patient denies shortness of breath, fever, cough and chest pain at PAT appointment   All instructions explained to the patient, with a verbal understanding of the material. Patient agrees to go over the instructions while at home for a better understanding. Patient also instructed to self quarantine after being tested for COVID-19. The opportunity to ask questions was provided.

## 2019-12-12 ENCOUNTER — Other Ambulatory Visit (HOSPITAL_COMMUNITY)
Admission: RE | Admit: 2019-12-12 | Discharge: 2019-12-12 | Disposition: A | Discharge status: Home / Self Care | Payer: Medicare Other | Source: Ambulatory Visit | Attending: Cardiovascular Disease | Admitting: Cardiovascular Disease

## 2019-12-12 ENCOUNTER — Encounter (HOSPITAL_COMMUNITY): Payer: Self-pay

## 2019-12-12 DIAGNOSIS — Z01812 Encounter for preprocedural laboratory examination: Secondary | ICD-10-CM | POA: Insufficient documentation

## 2019-12-12 DIAGNOSIS — Z20822 Contact with and (suspected) exposure to covid-19: Secondary | ICD-10-CM | POA: Insufficient documentation

## 2019-12-12 LAB — SARS CORONAVIRUS 2 (TAT 6-24 HRS): SARS Coronavirus 2: NEGATIVE

## 2019-12-12 MED ORDER — SODIUM CHLORIDE 0.9 % IV SOLN
1.5000 g | INTRAVENOUS | Status: AC
  Administered 2019-12-13: 1.5 g via INTRAVENOUS
  Filled 2019-12-12 (×2): qty 1.5

## 2019-12-12 MED ORDER — NOREPINEPHRINE 4 MG/250ML-% IV SOLN
0.0000 ug/min | INTRAVENOUS | Status: DC
  Filled 2019-12-12: qty 250

## 2019-12-12 MED ORDER — POTASSIUM CHLORIDE 2 MEQ/ML IV SOLN
80.0000 meq | INTRAVENOUS | Status: DC
  Filled 2019-12-12: qty 40

## 2019-12-12 MED ORDER — DEXMEDETOMIDINE HCL IN NACL 400 MCG/100ML IV SOLN
0.1000 ug/kg/h | INTRAVENOUS | Status: DC
  Filled 2019-12-12: qty 100

## 2019-12-12 MED ORDER — VANCOMYCIN HCL 1500 MG/300ML IV SOLN
1500.0000 mg | INTRAVENOUS | Status: AC
  Administered 2019-12-13: 1500 mg via INTRAVENOUS
  Filled 2019-12-12: qty 300

## 2019-12-12 MED ORDER — MAGNESIUM SULFATE 50 % IJ SOLN
40.0000 meq | INTRAMUSCULAR | Status: DC
  Filled 2019-12-12: qty 9.85

## 2019-12-12 MED ORDER — SODIUM CHLORIDE 0.9 % IV SOLN
INTRAVENOUS | Status: DC
  Filled 2019-12-12: qty 30

## 2019-12-12 NOTE — Progress Notes (Signed)
Anesthesia Chart Review:  Case: [id number] Date/Time: 12/13/19 0715   Procedures:      TRANSCATHETER AORTIC VALVE REPLACEMENT, TRANSFEMORAL (N/A Chest)     TRANSESOPHAGEAL ECHOCARDIOGRAM (TEE) (N/A )   Anesthesia type: General   Pre-op diagnosis: Severe Aortic Stenosis   Location: [facility] OR ROOM 16 / [place] OR   Surgeons: Burnell Blanks, MD      DISCUSSION: Patient is an 79 year old male scheduled for the above procedure.  History includes former smoker (quit 01/06/86), CAD (MI 1995 s/p pRCA stent; previous LAD and CX stents; PTCA D2 and PTCA/stent mid-distal LAD 05/22/99; patent OM1 and ostial-proximal CX stents, jailed small DIAG by mid LAD stent, moderate RCA stenosis, medical therapy for CAD 11/15/19), severe aortic stenosis, RBBB, HLD, DM2, anemia, IBS, GERD, skin cancer (SCC, nose), CKD (stage III), palmer fibromatosis, leukocytosis/lymphocytosis (seen by hematologist Dr. Irene Limbo 02/23/19, "Labs consistent with Chronic Lymphocytic Leukemia"). Retired Chief Executive Officer by notes.   WBC elevated at 17.8, but with known leukocytosis and diagnosis of CLL by Dr. Irene Limbo in February 20201.Cr 1.68, but consistent with previous results and known CKD. A1c 8.3%. UA negative for leukocytes, nitrites, and CXR showed no acute airspace disease. Labs are marked as reviewed by Dr. Angelena Form.  2nd COVID-19 vaccine 02/04/19. 12/12/19 presurgical COVID-19 test negative. Anesthesia team to evaluate on the day of surgery.    VS: BP 125/80   Pulse 80   Temp 36.8 C (Oral)   Resp 18   Ht 5\' 11"  (1.803 m)   Wt 85.5 kg   SpO2 98%   BMI 26.30 kg/m    PROVIDERS: Josetta Huddle, MD is PCP Daneen Schick, MD is cardiologist Sullivan Lone, MD is HEM-ONC. Last visit 02/23/19. 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 PET/CT recommended in 22 weeks.  Lavonna Monarch, MD is dermatologist Louis Meckel, MD is listed as urologist  (PCP records)   LABS: Preoperative labs noted. See DISCUSSION. (all labs ordered are listed, but only abnormal results are displayed)  Labs Reviewed  GLUCOSE, CAPILLARY - Abnormal; Notable for the following components:      Result Value   Glucose-Capillary 216 (*)    All other components within normal limits  BLOOD GAS, ARTERIAL - Abnormal; Notable for the following components:   Bicarbonate 18.6 (*)    Acid-base deficit 5.8 (*)    All other components within normal limits  CBC - Abnormal; Notable for the following components:   WBC 17.8 (*)    RBC 4.05 (*)    Hemoglobin 12.3 (*)    HCT 38.2 (*)    All other components within normal limits  COMPREHENSIVE METABOLIC PANEL - Abnormal; Notable for the following components:   CO2 16 (*)    Glucose, Bld 249 (*)    BUN 36 (*)    Creatinine, Ser 1.68 (*)    GFR, Estimated 41 (*)    All other components within normal limits  HEMOGLOBIN A1C - Abnormal; Notable for the following components:   Hgb A1c MFr Bld 8.3 (*)    All other components within normal limits  URINALYSIS, ROUTINE W REFLEX MICROSCOPIC - Abnormal; Notable for the following components:   Color, Urine STRAW (*)    Glucose, UA >=500 (*)    All other components within normal limits  SURGICAL PCR SCREEN  APTT  BRAIN NATRIURETIC PEPTIDE  PROTIME-INR  TYPE AND SCREEN     IMAGES: CXR  12/09/19: FINDINGS: No focal consolidation, pneumothorax or pleural effusion. Cardiomediastinal silhouette within normal limits. Stable coronary stent. Multilevel spondylosis. IMPRESSION: No acute airspace disease.  CTA chest/abd/pelvis 11/09/19 (ordered by Lauree Chandler, MD): IMPRESSION: 1. Vascular findings and measurements pertinent to potential TAVR procedure, as detailed above. 2. Severe thickening calcification of the aortic valve, compatible with reported clinical history of severe aortic stenosis. 3. Aortic atherosclerosis, in addition to left main and 3  vessel coronary artery disease. Assessment for potential risk factor modification, dietary therapy or pharmacologic therapy may be warranted, if clinically indicated. 4. Small pulmonary nodules scattered throughout the lungs bilaterally measuring 4 mm or less in size, nonspecific, but statistically benign. No follow-up needed if patient is low-risk (and has no known or suspected primary neoplasm). Non-contrast chest CT can be considered in 12 months if patient is high-risk. This recommendation follows the consensus statement: Guidelines for Management of Incidental Pulmonary Nodules Detected on CT Images: From the Fleischner Society 2017; Radiology 2017; 284:228-243. 5. 4 mm hypervascular lesion in segment 2 of the liver, nonspecific, but strongly favored to represent a benign lesions such as a small flash fill cavernous hemangioma. This could be definitively characterized with follow-up nonemergent abdominal MRI with and without IV gadolinium if of clinical concern. 6. Cholelithiasis without evidence of acute cholecystitis at this time. 7. Prostatomegaly with median lobe hypertrophy. 8. Additional incidental findings, as above. [See full report]   EKG: 11/15/19: Normal sinus rhythm Right bundle branch block Abnormal ECG No significant change since last tracing Confirmed by Charolette Forward ([id number]) on 11/15/2019 6:21:05 PM   CV: Carotid US 11/30/19: Summary:  - Right Carotid: Velocities in the right ICA are consistent with a 1-39% stenosis.  - Left Carotid: Velocities in the left ICA are consistent with a 1-39% stenosis.  - Vertebrals: Bilateral vertebral arteries demonstrate antegrade flow.  - Subclavians: Normal flow hemodynamics were seen in bilateral subclavian arteries.    Cardiac cath 11/15/19:  2nd Diag lesion is 95% stenosed.  Ost LAD to Mid LAD lesion is 40% stenosed.  Mid RCA to Dist RCA lesion is 65% stenosed.  Mid LAD lesion is 25% stenosed.  Previously placed  1st Mrg stent (unknown type) is widely patent.  Previously placed Ost Cx to Prox Cx stent (unknown type) is widely patent. 1. Moderate stenosis in the mid to distal RCA. Angiographically this is an eccentric lesion. DFR of the lesion in the mid to distal RCA was 0.97 suggesting the lesion was not flow limiting.  2. Patent mid Circumflex stents into the OM 3. Patent mid LAD stent. Jailed small Diagonal by the mid LAD stent.  4. Severe aortic stenosis ( mean gradient 44.6 mmHg, peak to peak gradient 49 mmHg, AVA 0.8 cm2).  - Recommendations: Medical management of CAD. Continue workup for TAVR.    CT Coronary 11/09/19: IMPRESSION: 1. Functionally bicuspid AV with annular area of 443 mm2 suitable for a 26 mm Sapien 3 valve 2. AV calcium score 2964 consistent with severe AS. Significant annular calcification at base of right and non commissure as well as Calcification of the intervalvular fibrosa 3.  Coronary arteries sufficient height above annulus for deployment 4. Optimum angiographic angle for deployment RAO 5 Caudal 16 degrees 5.  Normal aortic root 3.2 cm   Echo 08/02/19: IMPRESSIONS  1. Normal LV systolic function; grade 1 DD; mildly dilated aortic root;  severe AS (peak velocity 4.3 m/s; mean gradient 36 mmHg).  2. Left ventricular ejection fraction, by estimation, is 60 to 65%.  The  left ventricle has normal function. The left ventricle has no regional  wall motion abnormalities. Left ventricular diastolic parameters are  consistent with Grade I diastolic  dysfunction (impaired relaxation). Elevated left atrial pressure.  3. Right ventricular systolic function is normal. The right ventricular  size is normal.  4. The mitral valve is normal in structure. Trivial mitral valve  regurgitation. No evidence of mitral stenosis.  5. The aortic valve has an indeterminant number of cusps. Aortic valve  regurgitation is not visualized. Severe aortic valve stenosis.  6. Aortic  dilatation noted. There is mild dilatation of the aortic root  measuring 38 mm.  7. The inferior vena cava is normal in size with greater than 50%  respiratory variability, suggesting right atrial pressure of 3 mmHg.  - Comparison(s): 01/13/19 EF 60-65%. Severe AS.    Past Medical History:  Diagnosis Date  . Anemia   . Arthritis    hands  . Chronic kidney disease (CKD), stage III (moderate) (HCC)   . Coronary artery disease   . Elevated PSA   . Erectile dysfunction   . GERD (gastroesophageal reflux disease)   . Hyperlipidemia   . Hypertension   . Irritable bowel syndrome   . 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 Myocardial infarction ([place]) 1995  . Obesity   . Onychomycosis   . Palmar fibromatosis   . Right bundle branch block   . Severe aortic stenosis   . Squamous cell carcinoma of skin 05/13/2016   bridge of nose ([id number])  . Type 2 diabetes mellitus ([place])     Past Surgical History:  Procedure Laterality Date  . APPENDECTOMY    . CARDIAC CATHETERIZATION  2021  . cardiac stents    . CARPAL TUNNEL RELEASE Left 12/09/2016   Procedure: CARPAL TUNNEL RELEASE;  Surgeon: Daryll Brod, MD;  Location: [place];  Service: Orthopedics;  Laterality: Left;  . CARPAL TUNNEL WITH CUBITAL TUNNEL Right 01/13/2017   Procedure: RIGHT CARPAL TUNNEL WITH CUBITAL TUNNEL RELEASE;  Surgeon: Daryll Brod, MD;  Location: [place];  Service: Orthopedics;  Laterality: Right;  . EYE SURGERY Bilateral 2019   Dr. Gershon Crane  . INTRAVASCULAR PRESSURE WIRE/FFR STUDY N/A 11/15/2019   Procedure: INTRAVASCULAR PRESSURE WIRE/FFR STUDY;  Surgeon: Burnell Blanks, MD;  Location: [place] [facility];  Service: Cardiovascular;  Laterality: N/A;  . RIGHT/LEFT HEART CATH AND CORONARY ANGIOGRAPHY N/A 02/02/2019   Procedure: RIGHT/LEFT HEART CATH AND CORONARY ANGIOGRAPHY;  Surgeon: Belva Crome, MD;   Location: [place] [facility];  Service: Cardiovascular;  Laterality: N/A;  . RIGHT/LEFT HEART CATH AND CORONARY ANGIOGRAPHY N/A 11/15/2019   Procedure: RIGHT/LEFT HEART CATH AND CORONARY ANGIOGRAPHY;  Surgeon: Burnell Blanks, MD;  Location: [address] [facility];  Service: Cardiovascular;  Laterality: N/A;  . TRIGGER FINGER RELEASE Left 08/10/2018   Procedure: RELEASE TRIGGER FINGER/A-1 PULLEY THUMB AND LEFT SMALL;  Surgeon: Daryll Brod, MD;  Location: [place];  Service: Orthopedics;  Laterality: Left;  FAB  . ULNAR NERVE TRANSPOSITION Left 12/09/2016   Procedure: DECOMPRESSION LEFT ULNAR NERVE;  Surgeon: Daryll Brod, MD;  Location: [place];  Service: Orthopedics;  Laterality: Left;  . ULNAR NERVE TRANSPOSITION Right 01/13/2017   Procedure: DECOMPRESSION ULNAR NERVE;  Surgeon: Daryll Brod, MD;  Location: [place];  Service: Orthopedics;  Laterality: Right;    MEDICATIONS: . ACTOS 45 MG tablet  .  aspirin EC 81 MG tablet  . Cholecalciferol (VITAMIN D3 SUPER STRENGTH) 50 MCG (2000 UT) TABS  . Empagliflozin (JARDIANCE) 10 MG TABS  . Glucosamine-Chondroitin (GLUCOSAMINE CHONDR COMPLEX PO)  . glyBURIDE-metformin (GLUCOVANCE) 5-500 MG per tablet  . metoprolol succinate (TOPROL-XL) 50 MG 24 hr tablet  . Multiple Vitamin (MULTIVITAMIN WITH MINERALS) TABS tablet  . omega-3 acid ethyl esters (LOVAZA) 1 G capsule  . omeprazole (PRILOSEC) 20 MG capsule  . ONGLYZA 5 MG TABS tablet  . ramipril (ALTACE) 10 MG capsule  . VYTORIN 10-20 MG tablet   No current facility-administered medications for this encounter.   Derrill Memo ON 12/13/2019] cefUROXime (ZINACEF) 1.5 g in sodium chloride 0.9 % 100 mL IVPB  . [START ON 12/13/2019] dexmedetomidine (PRECEDEX) 400 MCG/100ML (4 mcg/mL) infusion  . [START ON 12/13/2019] heparin 30,000 units/NS 1000 mL solution for CELLSAVER  . [START ON 12/13/2019] magnesium sulfate (IV Push/IM) injection 40 mEq  . [START ON  12/13/2019] norepinephrine (LEVOPHED) 4mg  in 257mL premix infusion  . [START ON 12/13/2019] potassium chloride injection 80 mEq  . [START ON 12/13/2019] vancomycin (VANCOREADY) IVPB 1500 mg/300 mL    Myra Gianotti, PA-C Surgical Short Stay/Anesthesiology [facility] Phone [phone] [facility] Phone [phone] 12/12/2019 11:19 AM

## 2019-12-12 NOTE — Anesthesia Preprocedure Evaluation (Addendum)
Anesthesia Evaluation  Patient identified by MRN, date of birth, ID band Patient awake    Reviewed: Allergy & Precautions, H&P , NPO status , Patient's Chart, lab work & pertinent test results  Airway Mallampati: II   Neck ROM: full    Dental   Pulmonary former smoker,    breath sounds clear to auscultation       Cardiovascular hypertension, + CAD  + Valvular Problems/Murmurs AS  Rhythm:regular Rate:Normal  Severe AS. Normal LV function   Neuro/Psych  Neuromuscular disease    GI/Hepatic GERD  ,  Endo/Other  diabetes, Type 2  Renal/GU Renal InsufficiencyRenal disease     Musculoskeletal  (+) Arthritis ,   Abdominal   Peds  Hematology   Anesthesia Other Findings   Reproductive/Obstetrics                            Anesthesia Physical Anesthesia Plan  ASA: III  Anesthesia Plan: MAC   Post-op Pain Management:    Induction: Intravenous  PONV Risk Score and Plan: 1 and Ondansetron and Treatment may vary due to age or medical condition  Airway Management Planned: Simple Face Mask  Additional Equipment:   Intra-op Plan:   Post-operative Plan:   Informed Consent: I have reviewed the patients History and Physical, chart, labs and discussed the procedure including the risks, benefits and alternatives for the proposed anesthesia with the patient or authorized representative who has indicated his/her understanding and acceptance.       Plan Discussed with: CRNA, Anesthesiologist and Surgeon  Anesthesia Plan Comments: (PAT note written 12/12/2019 by Myra Gianotti, PA-C. )       Anesthesia Quick Evaluation

## 2019-12-12 NOTE — H&P (Signed)
[address]       [place] [address]             [phone]      Cardiothoracic Surgery Admission History and Physical   Referring Provider is Belva Crome, MD  Primary Cardiologist is Sinclair Grooms, MD  PCP is Josetta Huddle, MD      Chief Complaint  Patient presents with  . Aortic Stenosis       HPI:  The patient is a 79 year old gentleman with a history of type 2 diabetes, hyperlipidemia, hypertension, stage III chronic kidney disease, right bundle branch block, anemia, arthritis, and coronary artery disease status post MI in 1995 and PCI in 2001. He had a cardiac cath in January 2021 showing a patent LAD stent with moderate LAD stenosis as well as a patent left circumflex stent. There is moderate to severe distal RCA stenosis. An echocardiogram at that time showed severe aortic stenosis with calcified and thickened leaflets and limited mobility. The mean gradient was 43 mmHg with a peak of 71.2 mmHg. Aortic valve area was 0.8 cm with a dimensionless index of 0.28. He was seen by Dr. Angelena Form at that time and was asymptomatic and decision was made to continue close follow-up. He had a follow-up echocardiogram in July 2021 which showed a mean gradient across aortic valve of 36 mmHg with a valve area of 0.75 cm. He was seen again in August 2021 and remained asymptomatic. He followed up with Dr. Daneen Schick in October and reported onset of exertional substernal burning with walking up hills associated with mild shortness of breath. Therefore he underwent further work-up for consideration of TAVR. He was seen back by Dr. Angelena Form and underwent cardiac catheterization on 11/15/2019 which showed a moderate stenosis of 65% in the mid to distal RCA. The DFR of this lesion in the mid to distal RCA was 0.97 suggesting that it was not flow-limiting. There was patent mid left circumflex and obtuse marginal stents. There is a patent stent in the mid LAD. There was severe  aortic stenosis with a mean gradient of 44.6 mmHg and a peak to peak gradient of 49 mmHg. Aortic valve area was 0.8 cm.    He is a retired Forensic psychologist and lives with his wife in [place]. He said that he continues to be active playing golf 5 days/week, sometimes walking and sometimes using a cart. He has had no exertional fatigue or shortness of breath. He does report some burning in his chest with walking up hills only. Has had no symptoms on level ground or doing normal activities around his house. He denies orthopnea and PND. Has had no peripheral edema. He denies dizziness and syncope.      Past Medical History:  Diagnosis Date  . Anemia   . Arthritis    hands  . Chronic kidney disease (CKD), stage III (moderate) (HCC)   . Coronary artery disease   . Elevated PSA   . Erectile dysfunction   . GERD (gastroesophageal reflux disease)   . Hyperlipidemia   . Hypertension   . Irritable bowel syndrome   . Obesity   . Onychomycosis   . Palmar fibromatosis   . Right bundle branch block   . Severe aortic stenosis   . 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 Type 2 diabetes mellitus ([place]         Past Surgical History:  Procedure  Laterality Date  . APPENDECTOMY    . cardiac stents    . CARPAL TUNNEL RELEASE Left 12/09/2016   Procedure: CARPAL TUNNEL RELEASE; Surgeon: Daryll Brod, MD; Location: [facility]; Service: Orthopedics; Laterality: Left;  . CARPAL TUNNEL WITH CUBITAL TUNNEL Right 01/13/2017   Procedure: RIGHT CARPAL TUNNEL WITH CUBITAL TUNNEL RELEASE; Surgeon: Daryll Brod, MD; Location: [facility]; Service: Orthopedics; Laterality: Right;  . INTRAVASCULAR PRESSURE WIRE/FFR STUDY N/A 11/15/2019   Procedure: INTRAVASCULAR PRESSURE WIRE/FFR STUDY; Surgeon: Burnell Blanks, MD; Location: [facility]; Service: Cardiovascular; Laterality: N/A;  . RIGHT/LEFT HEART CATH AND CORONARY ANGIOGRAPHY N/A 02/02/2019    Procedure: RIGHT/LEFT HEART CATH AND CORONARY ANGIOGRAPHY; Surgeon: Belva Crome, MD; Location: [facility]; Service: Cardiovascular; Laterality: N/A;  . RIGHT/LEFT HEART CATH AND CORONARY ANGIOGRAPHY N/A 11/15/2019   Procedure: RIGHT/LEFT HEART CATH AND CORONARY ANGIOGRAPHY; Surgeon: Burnell Blanks, MD; Location: [facility]; Service: Cardiovascular; Laterality: N/A;  . TRIGGER FINGER RELEASE Left 08/10/2018   Procedure: RELEASE TRIGGER FINGER/A-1 PULLEY THUMB AND LEFT SMALL; Surgeon: Daryll Brod, MD; Location: [facility]; Service: Orthopedics; Laterality: Left; FAB  . ULNAR NERVE TRANSPOSITION Left 12/09/2016   Procedure: DECOMPRESSION LEFT ULNAR NERVE; Surgeon: Daryll Brod, MD; Location: [facility]; Service: Orthopedics; Laterality: Left;  . ULNAR NERVE TRANSPOSITION Right 01/13/2017   Procedure: DECOMPRESSION ULNAR NERVE; Surgeon: Daryll Brod, MD; Location: [facility]; Service: Orthopedics; Laterality: Right;        Family History  Problem Relation Age of Onset  . Ovarian cancer Mother 4  . Heart failure Father   . Cancer Paternal Uncle    Karposi Sarcoma  . Multiple myeloma Sister 95   Social History        Socioeconomic History  . Marital status: Married    Spouse name: Not on file  . Number of children: 2  . Years of education: Not on file  . Highest education level: Not on file  Occupational History  . Occupation: Retired Chief Executive Officer  Tobacco Use  . Smoking status: Former Smoker    Packs/day: 1.00    Years: 20.00    Pack years: 20.00    Types: Cigarettes    Quit date: 1988    Years since quitting: 33.9  . Smokeless tobacco: Never Used  Vaping Use  . Vaping Use: Never used  Substance and Sexual Activity  . Alcohol use: Yes    Alcohol/week: 0.0 standard drinks    Comment: social  . Drug use: No  . Sexual activity: Not on file  Other Topics Concern  . Not on file  Social History Narrative  . Not on file    Social Determinants of Health      Financial Resource Strain:   . Difficulty of Paying Living Expenses: Not on file  Food Insecurity:   . Worried About Charity fundraiser in the Last Year: Not on file  . Ran Out of Food in the Last Year: Not on file  Transportation Needs:   . Lack of Transportation (Medical): Not on file  . Lack of Transportation (Non-Medical): Not on file  Physical Activity:   . Days of Exercise per Week: Not on file  . Minutes of Exercise per Session: Not on file  Stress:   . Feeling of Stress : Not on file  Social Connections:   . Frequency of Communication with Friends and Family: Not on file  . Frequency of Social Gatherings with  Friends and Family: Not on file  . Attends Religious Services: Not on file  . Active Member of Clubs or Organizations: Not on file  . Attends Archivist Meetings: Not on file  . Marital Status: Not on file  Intimate Partner Violence:   . Fear of Current or Ex-Partner: Not on file  . Emotionally Abused: Not on file  . Physically Abused: Not on file  . Sexually Abused: Not on file         Current Outpatient Medications  Medication Sig Dispense Refill  . ACTOS 45 MG tablet Take 45 mg by mouth daily with supper.   1  . aspirin EC 81 MG tablet Take 1 tablet (81 mg total) by mouth daily. 90 tablet 3  . Cholecalciferol (VITAMIN D3 SUPER STRENGTH) 50 MCG (2000 UT) TABS Take 2,000 Units by mouth daily.    . Empagliflozin (JARDIANCE) 10 MG TABS Take 10 mg by mouth daily.     . Glucosamine-Chondroitin (GLUCOSAMINE CHONDR COMPLEX PO) Take 1 capsule by mouth 2 (two) times daily.    Marland Kitchen glyBURIDE-metformin (GLUCOVANCE) 5-500 MG per tablet Take 2 tablets by mouth 2 (two) times daily with a meal.    . metoprolol succinate (TOPROL-XL) 50 MG 24 hr tablet Take 50 mg by mouth at bedtime. Take with or immediately following a meal.     . Multiple Vitamin (MULTIVITAMIN WITH MINERALS) TABS tablet Take 1 tablet by mouth daily. Centrum silver     . omega-3 acid ethyl esters (LOVAZA) 1 G capsule Take 1 g by mouth 2 (two) times daily.     Marland Kitchen omeprazole (PRILOSEC) 20 MG capsule Take 20 mg by mouth 2 (two) times a week. Mondays & Thursdays.    . ONGLYZA 5 MG TABS tablet Take 5 mg by mouth daily with supper.   2  . ramipril (ALTACE) 10 MG capsule Take 10 mg by mouth daily with supper.     Marland Kitchen VYTORIN 10-20 MG tablet TAKE 1 TABLET BY MOUTH DAILY. (Patient taking differently: Take 1 tablet by mouth daily with supper. ) 90 tablet 3   No current facility-administered medications for this visit.        Allergies  Allergen Reactions  . Other Other (See Comments)    Agent: Vit C,e,zn,[id number]-lut-zeax  Reaction: GI upset  Agent: Vit C,e,zn,cu-omega3-lut-zeax  Reaction: GI upset   Review of Systems:   General: normal appetite, normal energy, no weight gain, no weight loss, no fever  Cardiac: + chest burning pain with exertion, no chest pain at rest, no SOB with exertion, no resting SOB, no PND, no orthopnea, no palpitations, no arrhythmia, no atrial fibrillation, no LE edema, no dizzy spells, no syncope  Respiratory: no shortness of breath, no home oxygen, no productive cough, no dry cough, no bronchitis, no wheezing, no hemoptysis, no asthma, no pain with inspiration or cough, no sleep apnea, no CPAP at night  GI: no difficulty swallowing, no reflux, no frequent heartburn, no hiatal hernia, no abdominal pain, no constipation, no diarrhea, no hematochezia, n hematemesis, no melena  GU: no dysuria, no frequency, no urinary tract infection, no hematuria, no enlarged prostate, no kidney stones, no kidney disease  Vascular: no pain suggestive of claudication, no pain in feet, no leg cramps, no varicose veins, no DVT, no non-healing foot ulcer  Neuro: no stroke, no TIA's, no seizures, no headaches, no temporary blindness one eye, no slurred speech, no peripheral neuropathy, no chronic pain, no instability of gait, no memory/cognitive dysfunction  Musculoskeletal: no arthritis, no joint swelling, no myalgias, no difficulty walking, normal mobility  Skin: no rash, no itching, no skin infections, no pressure sores or ulcerations  Psych: no anxiety, no depression, no nervousness, no unusual recent stress  Eyes: no blurry vision, no floaters, no recent vision changes, + wears glasses  ENT: no hearing loss, no loose or painful teeth, no dentures, last saw dentist this year  Hematologic: no easy bruising, no abnormal bleeding, no clotting disorder, no frequent epistaxis  Endocrine: + diabetes, does check CBG's at home    Physical Exam:   BP 128/73  Pulse 76  Temp 97.7 F (36.5 C) (Skin)  Resp 20  Ht _0  (1.778 m)  Wt 180 lb (81.6 kg)  SpO2 97% Comment: RA  BMI 25.83 kg/m  General: Well-appearing  HEENT: Unremarkable, NCAT, PERLA, EOMI  Neck: no JVD, no bruits, no adenopathy  Chest: clear to auscultation, symmetrical breath sounds, no wheezes, no rhonchi  CV: RRR, grade lll/VI crescendo/decrescendo murmur heard best at RSB, no diastolic murmur  Abdomen: soft, non-tender, no masses  Extremities: warm, well-perfused, pulses palpable at ankle, no LE edema  Rectal/GU Deferred  Neuro: Grossly non-focal and symmetrical throughout  Skin: Clean and dry, no rashes, no breakdown    Diagnostic Tests:   ECHOCARDIOGRAM REPORT     Patient Name: Barry Zavala Date of Exam: 08/02/2019  Medical Rec #: [id number] Height: 70.0 in  Accession #: [id number] Weight: 185.1 lb  Date of Birth: 1940/10/24 BSA: 2.020 m  Patient Age: 59 years BP: 112/56 mmHg  Patient Gender: M HR: 65 bpm.  Exam Location: [address]   Procedure: 2D Echo, 3D Echo, Cardiac Doppler, Color Doppler and Strain  Analysis   Indications: I35 Aortic stenosis.   History: Patient has prior history of Echocardiogram examinations,  most  recent 01/13/2019. CAD, Aortic Valve Disease,  Arrythmias:RBBB;  Risk Factors:Hypertension, Diabetes, Dyslipidemia, Former   Smoker  and Obesity. CKD stage 3.   Sonographer: Jessee Avers, RDCS  Referring Phys: [place]    1. Normal LV systolic function; grade 1 DD; mildly dilated aortic root;  severe AS (peak velocity 4.3 m/s; mean gradient 36 mmHg).  2. Left ventricular ejection fraction, by estimation, is 60 to 65%. The  left ventricle has normal function. The left ventricle has no regional  wall motion abnormalities. Left ventricular diastolic parameters are  consistent with Grade I diastolic  dysfunction (impaired relaxation). Elevated left atrial pressure.  3. Right ventricular systolic function is normal. The right ventricular  size is normal.  4. The mitral valve is normal in structure. Trivial mitral valve  regurgitation. No evidence of mitral stenosis.  5. The aortic valve has an indeterminant number of cusps. Aortic valve  regurgitation is not visualized. Severe aortic valve stenosis.  6. Aortic dilatation noted. There is mild dilatation of the aortic root  measuring 38 mm.  7. The inferior vena cava is normal in size with greater than 50%  respiratory variability, suggesting right atrial pressure of 3 mmHg.   Comparison(s): 01/13/19 EF 60-65%. Severe AS.   FINDINGS  Left Ventricle: Left ventricular ejection fraction, by estimation, is 60  to 65%. The left ventricle has normal function. The left ventricle has no  regional wall motion abnormalities. The left ventricular internal cavity  size was normal in size. There is  no left ventricular hypertrophy. Left ventricular diastolic parameters  are consistent with Grade I diastolic dysfunction (impaired relaxation).  Elevated left  atrial pressure.   Right Ventricle: The right ventricular size is normal.Right ventricular  systolic function is normal.   Left Atrium: Left atrial size was normal in size.   Right Atrium: Right atrial size was normal in size.   Pericardium: There is no evidence of pericardial  effusion.   Mitral Valve: The mitral valve is normal in structure. Normal mobility of  the mitral valve leaflets. Trivial mitral valve regurgitation. No evidence  of mitral valve stenosis.   Tricuspid Valve: The tricuspid valve is normal in structure. Tricuspid  valve regurgitation is trivial. No evidence of tricuspid stenosis.   Aortic Valve: The aortic valve has an indeterminant number of cusps.  Aortic valve regurgitation is not visualized. Severe aortic stenosis is  present. Aortic valve mean gradient measures 36.0 mmHg. Aortic valve peak  gradient measures 73.6 mmHg. Aortic  valve area, by VTI measures 0.86 cm.   Pulmonic Valve: The pulmonic valve was normal in structure. Pulmonic valve  regurgitation is trivial. No evidence of pulmonic stenosis.   Aorta: Aortic dilatation noted. There is mild dilatation of the aortic  root measuring 38 mm.   Venous: The inferior vena cava is normal in size with greater than 50%  respiratory variability, suggesting right atrial pressure of 3 mmHg.   IAS/Shunts: The interatrial septum is aneurysmal. No atrial level shunt  detected by color flow Doppler.   Additional Comments: Normal LV systolic function; grade 1 DD; mildly  dilated aortic root; severe AS (peak velocity 4.3 m/s; mean gradient 36  mmHg).    LEFT VENTRICLE  PLAX 2D  LVIDd: 4.20 cm Diastology  LVIDs: 2.80 cm LV e' lateral: 6.00 cm/s  LV PW: 1.00 cm LV E/e' lateral: 15.1  LV IVS: 0.90 cm LV e' medial: 4.95 cm/s  LVOT diam: 2.00 cm LV E/e' medial: 18.3  LV SV: 82  LV SV Index: 41 2D Longitudinal Strain  LVOT Area: 3.14 cm 2D Strain GLS (A2C): -27.5 %  2D Strain GLS (A3C): -23.0 %  2D Strain GLS (A4C): 16.6 %  2D Strain GLS Avg: -22.4 %   3D Volume EF:  3D EF: 58 %  LV EDV: 116 ml  LV ESV: 49 ml  LV SV: 67 ml   RIGHT VENTRICLE  RV Basal diam: 2.80 cm  RV S prime: 10.70 cm/s  TAPSE (M-mode): 2.2 cm   LEFT ATRIUM Index RIGHT ATRIUM Index  LA diam: 3.90 cm 1.93  cm/m RA Area: 11.40 cm  LA Vol (A2C): 62.6 ml 30.99 ml/m RA Volume: 22.20 ml 10.99 ml/m  LA Vol (A4C): 49.9 ml 24.70 ml/m  LA Biplane Vol: 56.0 ml 27.72 ml/m  AORTIC VALVE  AV Area (Vmax): 0.75 cm  AV Area (Vmean): 0.77 cm  AV Area (VTI): 0.86 cm  AV Vmax: 429.00 cm/s  AV Vmean: 276.000 cm/s  AV VTI: 0.958 m  AV Peak Grad: 73.6 mmHg  AV Mean Grad: 36.0 mmHg  LVOT Vmax: 102.00 cm/s  LVOT Vmean: 67.600 cm/s  LVOT VTI: 0.262 m  LVOT/AV VTI ratio: 0.27   AORTA  Ao Root diam: 3.80 cm  Ao Asc diam: 3.70 cm   MITRAL VALVE  SHUNTS  Systemic VTI: 0.26 m  MV E velocity: 90.80 cm/s Systemic Diam: 2.00 cm  MV A velocity: 114.00 cm/s  MV E/A ratio: 0.80   Kirk Ruths MD  Electronically signed by Kirk Ruths MD  Signature Date/Time: 08/02/2019/1:28:37 PM    Physicians  Panel Physicians Referring Physician Case Authorizing Physician  Lauree Chandler  D, MD (Primary)    Procedures  INTRAVASCULAR PRESSURE WIRE/FFR STUDY  RIGHT/LEFT HEART CATH AND CORONARY ANGIOGRAPHY  Conclusion  2nd Diag lesion is 95% stenosed.  Ost LAD to Mid LAD lesion is 40% stenosed.  Mid RCA to Dist RCA lesion is 65% stenosed.  Mid LAD lesion is 25% stenosed.  Previously placed 1st Mrg stent (unknown type) is widely patent.  Previously placed Ost Cx to Prox Cx stent (unknown type) is widely patent. 1. Moderate stenosis in the mid to distal RCA. Angiographically this is an eccentric lesion. DFR of the lesion in the mid to distal RCA was 0.97 suggesting the lesion was not flow limiting.  2. Patent mid Circumflex stents into the OM  3. Patent mid LAD stent. Jailed small Diagonal by the mid LAD stent.  4. Severe aortic stenosis ( mean gradient 44.6 mmHg, peak to peak gradient 49 mmHg, AVA 0.8 cm2).  Recommendations: Medical management of CAD. Continue workup for TAVR. He will be referred to see Dr. Cyndia Bent on 11/30/19.  Recommendations  Antiplatelet/Anticoag Medical management of CAD Continue  workup for TAVR. We will arrange a gated cardiac CT, CTA of the chest/abdomen and pelvis and carotid artery dopplers. He will then be referred to see one of the CT surgeons on our structural heart team.  Indications  Severe aortic stenosis [I35.0 (ICD-10-CM)]  Coronary artery disease involving native coronary artery of native heart with other form of angina pectoris ([place]) [P10.258 (ICD-10-CM)]  Procedural Details  Technical Details Indication: 79 yo male with history of severe aortic stenosis, CAD with recent worsened dyspnea and chest discomfort.   Procedure: The risks, benefits, complications, treatment options, and expected outcomes were discussed with the patient. The patient and/or family concurred with the proposed plan, giving informed consent. The patient was brought to the cath lab after IV hydration was given. The patient was sedated with Versed and Fentanyl. The IV catheter in the right antecubital vein was changed for a 5 [place] sheath. Right heart catheterization performed with a balloon tipped catheter. The right wrist was prepped and draped in a sterile fashion. 1% lidocaine was used for local anesthesia. Using the modified Seldinger access technique, a 5 French sheath was placed in the right radial artery. 3 mg Verapamil was given through the sheath. 5000 units IV heparin was given. Standard diagnostic catheters were used to perform selective coronary angiography. I crossed the aortic valve with an AL-1 catheter and a straight wire. LV pressures measured.   Pressure wire analysis: IV heparin given for anti-coagulation. ACT over 240. I engaged the RCA with a JR4 guiding catheter and passed a Comet pressure wire down the RCA. DFR 0.97 suggesting the mid to distal lesion was not flow limiting.   The sheath was removed from the right radial artery and a Terumo hemostasis band was applied at the arteriotomy site on the right wrist.    Estimated blood loss <50 mL.   During this procedure  medications were administered to achieve and maintain moderate conscious sedation while the patient's heart rate, blood pressure, and oxygen saturation were continuously monitored and I was present face-to-face 100% of this time.  Medications  (Filter: Administrations occurring from 1306 to 1449 on 11/15/19)  Heparin (Porcine) in NaCl 1000-0.9 UT/500ML-% SOLN (mL)  Total volume: 1,000 mL  Date/Time  Rate/Dose/Volume Action  11/15/19 1319  500 mL Given  1320  500 mL Given  fentaNYL (SUBLIMAZE) injection (mcg)  Total dose: 50 mcg  Date/Time  Rate/Dose/Volume Action  11/15/19  1322  25 mcg Given  1415  25 mcg Given  midazolam (VERSED) injection (mg)  Total dose: 3 mg  Date/Time  Rate/Dose/Volume Action  11/15/19 1322  2 mg Given  1415  1 mg Given  lidocaine (PF) (XYLOCAINE) 1 % injection (mL)  Total volume: 2 mL  Date/Time  Rate/Dose/Volume Action  11/15/19 1336  2 mL Given  Radial Cocktail/Verapamil only (mL)  Total volume: 10 mL  Date/Time  Rate/Dose/Volume Action  11/15/19 1344  10 mL Given  heparin sodium (porcine) injection (Units)  Total dose: 10,000 Units  Date/Time  Rate/Dose/Volume Action  11/15/19 1345  5,000 Units Given  1350  5,000 Units Given  iohexol (OMNIPAQUE) 350 MG/ML injection (mL)  Total volume: 55 mL  Date/Time  Rate/Dose/Volume Action  11/15/19 1433  55 mL Given  Sedation Time  Sedation Time Physician-1: 1 hour 4 minutes 32 seconds  Contrast  Medication Name Total Dose  iohexol (OMNIPAQUE) 350 MG/ML injection 55 mL  Radiation/Fluoro  Fluoro time: 11 (min)  DAP: 20.1 (Gycm2)  Cumulative Air Kerma: 144.8 (mGy)  Complications  Complications documented before study signed (11/15/2019 3:06 PM)     Log Level Complications   None Documented by Burnell Blanks, MD 11/15/2019 2:56 PM  Date Found: 11/15/2019  Time Range: Intraprocedure  Coronary Findings  Diagnostic  Dominance: Right  Left Anterior Descending  There is mild diffuse disease  throughout the vessel.  Ost LAD to Mid LAD lesion is 40% stenosed.  Mid LAD lesion is 25% stenosed. The lesion was previously treated using a stent (unknown type) over 2 years ago.  Second Diagonal Branch  Vessel is small in size.  2nd Diag lesion is 95% stenosed.  Left Circumflex  Previously placed Ost Cx to Prox Cx stent (unknown type) is widely patent.  First Obtuse Marginal Branch  Previously placed 1st Mrg stent (unknown type) is widely patent.  Right Coronary Artery  There is moderate diffuse disease throughout the vessel.  Mid RCA to Dist RCA lesion is 65% stenosed. Pressure wire/FFR was performed on the lesion. DFR 0.97.  Intervention  No interventions have been documented.  Coronary Diagrams  Diagnostic  Dominance: Right   Intervention  Implants     No implant documentation for this case.  Syngo Images  Show images for CARDIAC CATHETERIZATION  Images on Long Term Storage  Show images for [place], [place] to Procedure Log    Procedure Log  Hemo Data   Most Recent Value  Fick Cardiac Output 5.05 L/min  Fick Cardiac Output Index 2.53 (L/min)/BSA  Aortic Mean Gradient 44.65 mmHg  Aortic Peak Gradient 49 mmHg  Aortic Valve Area 0.80  Aortic Value Area Index 0.4 cm2/BSA  RA A Wave 3 mmHg  RA V Wave 1 mmHg  RA Mean 0 mmHg  RV Systolic Pressure 19 mmHg  RV Diastolic Pressure -1 mmHg  RV EDP 2 mmHg  PA Systolic Pressure 17 mmHg  PA Diastolic Pressure 2 mmHg  PA Mean 7 mmHg  PW A Wave 4 mmHg  PW V Wave 5 mmHg  PW Mean 2 mmHg  AO Systolic Pressure 185 mmHg  AO Diastolic Pressure 43 mmHg  AO Mean 66 mmHg  LV Systolic Pressure 631 mmHg  LV Diastolic Pressure 0 mmHg  LV EDP 4 mmHg  AOp Systolic Pressure 497 mmHg  AOp Diastolic Pressure 41 mmHg  AOp Mean Pressure 64 mmHg  LVp Systolic Pressure 026 mmHg  LVp Diastolic Pressure 0 mmHg  LVp EDP Pressure 6  mmHg  QP/QS 1  TPVR Index 2.77 HRUI  TSVR Index 26.14 HRUI  TPVR/TSVR Ratio 0.11     ADDENDUM  REPORT: 11/09/2019 14:24  CLINICAL DATA: Aortic stenosis  EXAM:  Cardiac TAVR CT  TECHNIQUE:  The patient was scanned on a Siemens Force 161 slice scanner. A 120  kV retrospective scan was triggered in the descending thoracic aorta  at 111 HU's. Gantry rotation speed was 270 msecs and collimation was  .9 mm. No beta blockade or nitro were given. The 3D data set was  reconstructed in 5% intervals of the R-R cycle. Systolic and  diastolic phases were analyzed on a dedicated work station using  MPR, MIP and VRT modes. The patient received 80 cc of contrast.  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 calcified with restricted motion AV calcium score 2964  Aorta:  Sino-tubular Junction: 25 mm  Ascending Thoracic Aorta: 32 mm  Aortic Arch: 23 mm  Descending Thoracic Aorta: 22 mm  Sinus of Valsalva Measurements:  Non-coronary: 32.7 mm  Right - coronary: 33.4 mm  Left - coronary: 33.8 mm  Coronary Artery Height above Annulus:  Left Main: 12.5 mm above annulus  Right Coronary: 15.8 mm above annulus  Virtual Basal Annulus Measurements:  Maximum/Minimum Diameter: 27.2 mm x 20.5 mm  Perimeter: 78.7 mm  Area: Area 443 mm2  Coronary Arteries: Sufficient height above annulus for deployment  Optimum Fluoroscopic Angle for Delivery: RAO 6 Caudal 16 degrees  IMPRESSION:  1. 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 AV calcium score 2964 consistent with severe AS. Significant  annular calcification at base of right and non commissure as well as  Calcification of the intervalvular fibrosa  3. Coronary arteries sufficient height above annulus for deployment  4. Optimum angiographic angle for deployment RAO 5 Caudal 16 degrees  5. Normal aortic root 3.2 cm  Jenkins Rouge  Electronically Signed  By: Jenkins Rouge M.D.  On: 11/09/2019 14:24   Addended by Josue Hector, MD on 11/09/2019 2:26 PM  Study  Result  Addenda  ADDENDUM REPORT: 11/09/2019 14:24  CLINICAL DATA: Aortic stenosis  EXAM:  Cardiac TAVR CT  TECHNIQUE:  The patient was scanned on a Siemens Force 096 slice scanner. A 120  kV retrospective scan was triggered in the descending thoracic aorta  at 111 HU's. Gantry rotation speed was 270 msecs and collimation was  .9 mm. No beta blockade or nitro were given. The 3D data set was  reconstructed in 5% intervals of the R-R cycle. Systolic and  diastolic phases were analyzed on a dedicated work station using  MPR, MIP and VRT modes. The patient received 80 cc of contrast.  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 calcified with restricted motion AV calcium score 2964  Aorta:  Sino-tubular Junction: 25 mm  Ascending Thoracic Aorta: 32 mm  Aortic Arch: 23 mm  Descending Thoracic Aorta: 22 mm  Sinus of Valsalva Measurements:  Non-coronary: 32.7 mm  Right - coronary: 33.4 mm  Left - coronary: 33.8 mm  Coronary Artery Height above Annulus:  Left Main: 12.5 mm above annulus  Right Coronary: 15.8 mm above annulus  Virtual Basal Annulus Measurements:  Maximum/Minimum Diameter: 27.2 mm x 20.5 mm  Perimeter: 78.7 mm  Area: Area 443 mm2  Coronary Arteries: Sufficient height above annulus for deployment  Optimum Fluoroscopic  Angle for Delivery: RAO 6 Caudal 16 degrees  IMPRESSION:  1. 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 AV calcium score 2964 consistent with severe AS. Significant  annular calcification at base of right and non commissure as well as  Calcification of the intervalvular fibrosa  3. Coronary arteries sufficient height above annulus for deployment  4. Optimum angiographic angle for deployment RAO 5 Caudal 16 degrees  5. Normal aortic root 3.2 cm  Jenkins Rouge  Electronically Signed  By: Jenkins Rouge M.D.  On: 11/09/2019 14:24   Signed by Josue Hector, MD on  11/09/2019 2:26 PM  Narrative & Impression  EXAM:  OVER-READ INTERPRETATION CT CHEST  The following report is an over-read performed by radiologist Dr.  Vinnie Langton of [facility], [place] on 11/09/2019. This  over-read does not include interpretation of cardiac or coronary  anatomy or pathology. The coronary calcium score/coronary CTA  interpretation by the cardiologist is attached.  COMPARISON: None.  FINDINGS:  Extracardiac findings will be described separately under dictation  for contemporaneously obtained CTA chest, abdomen and pelvis.  IMPRESSION:  Please see separate dictation for contemporaneously obtained CTA  chest, abdomen and pelvis dated 11/09/2019 for full description of  relevant extracardiac findings.  Electronically Signed:  By: Vinnie Langton M.D.  On: 11/09/2019 10:54    Narrative & Impression  CLINICAL DATA: 79 year old male with history of severe aortic  stenosis. Preprocedural study prior to potential transcatheter  aortic valve replacement (TAVR) procedure.  EXAM:  CT ANGIOGRAPHY CHEST, ABDOMEN AND PELVIS  TECHNIQUE:  Multidetector CT imaging through the chest, abdomen and pelvis was  performed using the standard protocol during bolus administration of  intravenous contrast. Multiplanar reconstructed images and MIPs were  obtained and reviewed to evaluate the vascular anatomy.  CONTRAST: 134m OMNIPAQUE IOHEXOL 350 MG/ML SOLN  COMPARISON: No priors.  FINDINGS:  CTA CHEST FINDINGS  Cardiovascular: Heart size is normal. There is no significant  pericardial fluid, thickening or pericardial calcification. There is  aortic atherosclerosis, as well as atherosclerosis of the great  vessels of the mediastinum and the coronary arteries, including  calcified atherosclerotic plaque in the left main, left anterior  descending, left circumflex and right coronary arteries. Severe  thickening calcification of the aortic valve.  Mediastinum/Lymph Nodes: No  pathologically enlarged mediastinal or  hilar lymph nodes. Esophagus is unremarkable in appearance. No  axillary lymphadenopathy.  Lungs/Pleura: Multiple small pulmonary nodules scattered throughout  the lungs bilaterally, largest of which measures only 4 mm in the  right lower lobe (axial image 70 of series 8). No other larger more  suspicious appearing pulmonary nodules or masses are noted. No acute  consolidative airspace disease. No pleural effusions.  Musculoskeletal/Soft Tissues: There are no aggressive appearing  lytic or blastic lesions noted in the visualized portions of the  skeleton.  CTA ABDOMEN AND PELVIS FINDINGS  Hepatobiliary: 4 mm hypervascular lesion in segment 2 of the liver  (axial image 80 of series 7), too small to characterize, but  statistically likely to represent a small flash fill cavernous  hemangioma or perfusion anomaly. No other larger more suspicious  appearing hepatic lesions. No intra or extrahepatic biliary ductal  dilatation. Tiny calcified gallstones lying dependently in the neck  of the gallbladder. No findings to suggest an acute cholecystitis at  this time.  Pancreas: No pancreatic mass. No pancreatic ductal dilatation. No  pancreatic or peripancreatic fluid collections or inflammatory  changes.  Spleen: Unremarkable.  Adrenals/Urinary Tract: Bilateral kidneys and bilateral adrenal  glands are normal in appearance. No hydroureteronephrosis. Urinary  bladder is normal in appearance.  Stomach/Bowel: Normal appearance of the stomach. No pathologic  dilatation of small bowel or colon. The appendix is not confidently  identified and may be surgically absent. Regardless, there are no  inflammatory changes noted adjacent to the cecum to suggest the  presence of an acute appendicitis at this time.  Vascular/Lymphatic: Aortic atherosclerosis, with vascular findings  and measurements pertinent to potential TAVR procedure, as detailed  below. No aneurysm  or dissection noted in the abdominal or pelvic  vasculature. No lymphadenopathy noted in the abdomen or pelvis.  Reproductive: Prostate gland is severely enlarged with median lobe  hypertrophy measuring 6.6 x 6.5 x 6.7 cm. Seminal vesicles are  unremarkable in appearance.  Other: No significant volume of ascites. No pneumoperitoneum.  Musculoskeletal: There are no aggressive appearing lytic or blastic  lesions noted in the visualized portions of the skeleton.  VASCULAR MEASUREMENTS PERTINENT TO TAVR:  AORTA:  Minimal Aortic Diameter-12 x 12 mm  Severity of Aortic Calcification-moderate to severe  RIGHT PELVIS:  Right Common Iliac Artery -  Minimal Diameter-10.3 x 9.5 mm  Tortuosity-mild  Calcification-mild  Right External Iliac Artery -  Minimal Diameter-9.3 x 9.1 mm  Tortuosity-moderate  Calcification-none  Right Common Femoral Artery -  Minimal Diameter-8.0 x 7.2 mm  Tortuosity-mild  Calcification-mild  LEFT PELVIS:  Left Common Iliac Artery -  Minimal Diameter-8.7 x 9.3 mm  Tortuosity-mild  Calcification-mild  Left External Iliac Artery -  Minimal Diameter-8.4 x 8.8 mm  Tortuosity-moderate  Calcification-mild  Left Common Femoral Artery -  Minimal Diameter-8.2 x 7.5 mm  Tortuosity-mild  Calcification-mild  Review of the MIP images confirms the above findings.  IMPRESSION:  1. Vascular findings and measurements pertinent to potential TAVR  procedure, as detailed above.  2. Severe thickening calcification of the aortic valve, compatible  with reported clinical history of severe aortic stenosis.  3. Aortic atherosclerosis, in addition to left main and 3 vessel  coronary artery disease. Assessment for potential risk factor  modification, dietary therapy or pharmacologic therapy may be  warranted, if clinically indicated.  4. Small pulmonary nodules scattered throughout the lungs  bilaterally measuring 4 mm or less in size, nonspecific, but  statistically benign. No  follow-up needed if patient is low-risk  (and has no known or suspected primary neoplasm). Non-contrast chest  CT can be considered in 12 months if patient is high-risk. This  recommendation follows the consensus statement: Guidelines for  Management of Incidental Pulmonary Nodules Detected on CT Images:  From the Fleischner Society 2017; Radiology 2017; 284:228-243.  5. 4 mm hypervascular lesion in segment 2 of the liver, nonspecific,  but strongly favored to represent a benign lesions such as a small  flash fill cavernous hemangioma. This could be definitively  characterized with follow-up nonemergent abdominal MRI with and  without IV gadolinium if of clinical concern.  6. Cholelithiasis without evidence of acute cholecystitis at this  time.  7. Prostatomegaly with median lobe hypertrophy.  8. Additional incidental findings, as above.  Electronically Signed  By: Vinnie Langton M.D.  On: 11/09/2019 13:05     STS Risk Score:   Risk of Mortality:  1.208%  Renal Failure:  1.698%  Permanent Stroke:  1.127%  Prolonged Ventilation:  4.979%  DSW Infection:  0.083%  Reoperation:  3.707%  Morbidity or Mortality:  8.754%  Short Length of Stay:  47.025%  Long Length of Stay:  3.740%    Impression:   This 79 year old gentleman has stage D, severe, symptomatic aortic stenosis with New York Heart Association class Il symptoms of exertional chest burning and mild shortness of breath with walking up hills. I have personally reviewed his 2D echocardiogram, cardiac catheterization, and CTA studies. 2D echocardiogram shows an indeterminate number of cost with severely calcified leaflets and restricted mobility. The mean gradient is 36 mmHg with a valve area of 0.86 cm consistent with severe aortic stenosis. Left ventricular systolic function is normal. Cardiac catheterization showed moderate nonobstructive coronary disease that can be managed medically. The mean gradient across the  aortic valve was measured at 44.6 mmHg consistent with severe aortic stenosis. I agree that aortic valve replacement is indicated in this patient for relief of his symptoms and to prevent progressive left ventricular deterioration. Given his age I think transcatheter aortic valve replacement would be a reasonable option for treating his severe aortic stenosis. His gated cardiac CTA shows a functionally bicuspid aortic valve with fusion of the right and left cusp with heavy calcification of the leaflets and restricted motion. Anatomy is suitable for TAVR although there is significant annular calcification at the base of the right and on commissure as well as calcification extending down into the intravalvular fibrosa which may increase the risk of perivalvular leak. Abdominal and pelvic CTA shows adequate pelvic vascular anatomy to allow transfemoral insertion.  The patient and his wife were counseled at length regarding treatment alternatives for management of severe symptomatic aortic stenosis. The risks and benefits of surgical intervention has been discussed in detail. Long-term prognosis with medical therapy was discussed. Alternative approaches such as conventional surgical aortic valve replacement, transcatheter aortic valve replacement, and palliative medical therapy were compared and contrasted at length. This discussion was placed in the context of the patient's own specific clinical presentation and past medical history. All of their questions have been addressed. I reviewed the CTA images with the patient and his wife. I discussed the calcification of his annulus and the increased risk of perivalvular leak. He understands this and wants to proceed with transcatheter aortic valve replacement instead of open surgical AVR.  Following the decision to proceed with transcatheter aortic valve replacement, a discussion was held regarding what types of management strategies would be attempted intraoperatively in  the event of life-threatening complications, including whether or not the patient would be considered a candidate for the use of cardiopulmonary bypass and/or conversion to open sternotomy for attempted surgical intervention. The patient is aware of the fact that transient use of cardiopulmonary bypass may be necessary. I think he is a candidate for emergent sternotomy if needed to manage any intraoperative complications and I discussed that with the patient. The patient has been advised of a variety of complications that might develop including but not limited to risks of death, stroke, paravalvular leak, aortic dissection or other major vascular complications, aortic annulus rupture, device embolization, cardiac rupture or perforation, mitral regurgitation, acute myocardial infarction, arrhythmia, heart block or bradycardia requiring permanent pacemaker placement, congestive heart failure, respiratory failure, renal failure, pneumonia, infection, other late complications related to structural valve deterioration or migration, or other complications that might ultimately cause a temporary or permanent loss of functional independence or other long term morbidity. The patient provides full informed consent for the procedure as described and all questions were answered.    Plan:   Transfemoral transcatheter aortic valve replacement using a SAPIEN 3 valve.  Gaye Pollack, MD

## 2019-12-13 ENCOUNTER — Inpatient Hospital Stay (HOSPITAL_COMMUNITY): Payer: Medicare Other | Admitting: Certified Registered Nurse Anesthetist

## 2019-12-13 ENCOUNTER — Inpatient Hospital Stay (HOSPITAL_COMMUNITY)
Admission: RE | Admit: 2019-12-13 | Discharge: 2019-12-14 | DRG: 267 | Disposition: A | Discharge status: Home / Self Care | Payer: Medicare Other | Attending: Cardiovascular Disease | Admitting: Cardiovascular Disease

## 2019-12-13 ENCOUNTER — Inpatient Hospital Stay (HOSPITAL_COMMUNITY): Payer: Medicare Other

## 2019-12-13 ENCOUNTER — Encounter (HOSPITAL_COMMUNITY): Payer: Self-pay | Admitting: Cardiovascular Disease

## 2019-12-13 ENCOUNTER — Encounter (HOSPITAL_COMMUNITY)
Admission: RE | Disposition: A | Discharge status: Home / Self Care | Payer: Self-pay | Source: Home / Self Care | Attending: Cardiovascular Disease

## 2019-12-13 ENCOUNTER — Other Ambulatory Visit: Payer: Self-pay

## 2019-12-13 DIAGNOSIS — Z85828 Personal history of other malignant neoplasm of skin: Secondary | ICD-10-CM

## 2019-12-13 DIAGNOSIS — I2510 Atherosclerotic heart disease of native coronary artery without angina pectoris: Secondary | ICD-10-CM | POA: Diagnosis present

## 2019-12-13 DIAGNOSIS — Z006 Encounter for examination for normal comparison and control in clinical research program: Secondary | ICD-10-CM | POA: Diagnosis not present

## 2019-12-13 DIAGNOSIS — I252 Old myocardial infarction: Secondary | ICD-10-CM | POA: Diagnosis not present

## 2019-12-13 DIAGNOSIS — I088 Other rheumatic multiple valve diseases: Secondary | ICD-10-CM | POA: Diagnosis not present

## 2019-12-13 DIAGNOSIS — Z955 Presence of coronary angioplasty implant and graft: Secondary | ICD-10-CM | POA: Diagnosis not present

## 2019-12-13 DIAGNOSIS — M1990 Unspecified osteoarthritis, unspecified site: Secondary | ICD-10-CM | POA: Diagnosis present

## 2019-12-13 DIAGNOSIS — N529 Male erectile dysfunction, unspecified: Secondary | ICD-10-CM | POA: Diagnosis present

## 2019-12-13 DIAGNOSIS — K219 Gastro-esophageal reflux disease without esophagitis: Secondary | ICD-10-CM | POA: Diagnosis present

## 2019-12-13 DIAGNOSIS — K589 Irritable bowel syndrome without diarrhea: Secondary | ICD-10-CM | POA: Diagnosis present

## 2019-12-13 DIAGNOSIS — I129 Hypertensive chronic kidney disease with stage 1 through stage 4 chronic kidney disease, or unspecified chronic kidney disease: Secondary | ICD-10-CM | POA: Diagnosis present

## 2019-12-13 DIAGNOSIS — I25118 Atherosclerotic heart disease of native coronary artery with other forms of angina pectoris: Secondary | ICD-10-CM | POA: Diagnosis present

## 2019-12-13 DIAGNOSIS — Z79899 Other long term (current) drug therapy: Secondary | ICD-10-CM

## 2019-12-13 DIAGNOSIS — Z87891 Personal history of nicotine dependence: Secondary | ICD-10-CM

## 2019-12-13 DIAGNOSIS — B351 Tinea unguium: Secondary | ICD-10-CM | POA: Diagnosis present

## 2019-12-13 DIAGNOSIS — Z20822 Contact with and (suspected) exposure to covid-19: Secondary | ICD-10-CM | POA: Diagnosis present

## 2019-12-13 DIAGNOSIS — Z7982 Long term (current) use of aspirin: Secondary | ICD-10-CM | POA: Diagnosis not present

## 2019-12-13 DIAGNOSIS — Z888 Allergy status to other drugs, medicaments and biological substances status: Secondary | ICD-10-CM

## 2019-12-13 DIAGNOSIS — E669 Obesity, unspecified: Secondary | ICD-10-CM | POA: Diagnosis present

## 2019-12-13 DIAGNOSIS — Z8249 Family history of ischemic heart disease and other diseases of the circulatory system: Secondary | ICD-10-CM | POA: Diagnosis not present

## 2019-12-13 DIAGNOSIS — Z952 Presence of prosthetic heart valve: Secondary | ICD-10-CM | POA: Diagnosis not present

## 2019-12-13 DIAGNOSIS — N183 Chronic kidney disease, stage 3 unspecified: Secondary | ICD-10-CM | POA: Diagnosis present

## 2019-12-13 DIAGNOSIS — I350 Nonrheumatic aortic (valve) stenosis: Principal | ICD-10-CM | POA: Diagnosis present

## 2019-12-13 DIAGNOSIS — E1122 Type 2 diabetes mellitus with diabetic chronic kidney disease: Secondary | ICD-10-CM | POA: Diagnosis not present

## 2019-12-13 DIAGNOSIS — I10 Essential (primary) hypertension: Secondary | ICD-10-CM | POA: Diagnosis present

## 2019-12-13 DIAGNOSIS — E119 Type 2 diabetes mellitus without complications: Secondary | ICD-10-CM

## 2019-12-13 DIAGNOSIS — N1831 Chronic kidney disease, stage 3a: Secondary | ICD-10-CM | POA: Diagnosis present

## 2019-12-13 DIAGNOSIS — E785 Hyperlipidemia, unspecified: Secondary | ICD-10-CM | POA: Diagnosis present

## 2019-12-13 DIAGNOSIS — Z7984 Long term (current) use of oral hypoglycemic drugs: Secondary | ICD-10-CM | POA: Diagnosis not present

## 2019-12-13 DIAGNOSIS — I4510 Unspecified right bundle-branch block: Secondary | ICD-10-CM | POA: Diagnosis not present

## 2019-12-13 HISTORY — PX: TRANSCATHETER AORTIC VALVE REPLACEMENT, TRANSFEMORAL: SHX6400

## 2019-12-13 HISTORY — DX: Presence of prosthetic heart valve: Z95.2

## 2019-12-13 HISTORY — PX: TEE WITHOUT CARDIOVERSION: SHX5443

## 2019-12-13 LAB — POCT I-STAT 7, (LYTES, BLD GAS, ICA,H+H)
Acid-base deficit: 3 mmol/L — ABNORMAL HIGH (ref 0.0–2.0)
Acid-base deficit: 4 mmol/L — ABNORMAL HIGH (ref 0.0–2.0)
Bicarbonate: 22.6 mmol/L (ref 20.0–28.0)
Bicarbonate: 22.4 mmol/L (ref 20.0–28.0)
Calcium, Ion: 1.3 mmol/L (ref 1.15–1.40)
Calcium, Ion: 1.3 mmol/L (ref 1.15–1.40)
HCT: 35 % — ABNORMAL LOW (ref 39.0–52.0)
HCT: 34 % — ABNORMAL LOW (ref 39.0–52.0)
Hemoglobin: 11.9 g/dL — ABNORMAL LOW (ref 13.0–17.0)
Hemoglobin: 11.6 g/dL — ABNORMAL LOW (ref 13.0–17.0)
O2 Saturation: 100 %
O2 Saturation: 99 %
Potassium: 4.5 mmol/L (ref 3.5–5.1)
Potassium: 5.2 mmol/L — ABNORMAL HIGH (ref 3.5–5.1)
Sodium: 141 mmol/L (ref 135–145)
Sodium: 140 mmol/L (ref 135–145)
TCO2: 24 mmol/L (ref 22–32)
TCO2: 24 mmol/L (ref 22–32)
pCO2 arterial: 40.1 mmHg (ref 32.0–48.0)
pCO2 arterial: 45.8 mmHg (ref 32.0–48.0)
pH, Arterial: 7.359 (ref 7.350–7.450)
pH, Arterial: 7.297 — ABNORMAL LOW (ref 7.350–7.450)
pO2, Arterial: 187 mmHg — ABNORMAL HIGH (ref 83.0–108.0)
pO2, Arterial: 156 mmHg — ABNORMAL HIGH (ref 83.0–108.0)

## 2019-12-13 LAB — POCT I-STAT, CHEM 8
BUN: 28 mg/dL — ABNORMAL HIGH (ref 8–23)
BUN: 28 mg/dL — ABNORMAL HIGH (ref 8–23)
BUN: 29 mg/dL — ABNORMAL HIGH (ref 8–23)
BUN: 39 mg/dL — ABNORMAL HIGH (ref 8–23)
Calcium, Ion: 1.3 mmol/L (ref 1.15–1.40)
Calcium, Ion: 1.3 mmol/L (ref 1.15–1.40)
Calcium, Ion: 1.34 mmol/L (ref 1.15–1.40)
Calcium, Ion: 1.31 mmol/L (ref 1.15–1.40)
Chloride: 106 mmol/L (ref 98–111)
Chloride: 107 mmol/L (ref 98–111)
Chloride: 106 mmol/L (ref 98–111)
Chloride: 107 mmol/L (ref 98–111)
Creatinine, Ser: 1.4 mg/dL — ABNORMAL HIGH (ref 0.61–1.24)
Creatinine, Ser: 1.4 mg/dL — ABNORMAL HIGH (ref 0.61–1.24)
Creatinine, Ser: 1.4 mg/dL — ABNORMAL HIGH (ref 0.61–1.24)
Creatinine, Ser: 1.4 mg/dL — ABNORMAL HIGH (ref 0.61–1.24)
Glucose, Bld: 215 mg/dL — ABNORMAL HIGH (ref 70–99)
Glucose, Bld: 260 mg/dL — ABNORMAL HIGH (ref 70–99)
Glucose, Bld: 265 mg/dL — ABNORMAL HIGH (ref 70–99)
Glucose, Bld: 256 mg/dL — ABNORMAL HIGH (ref 70–99)
HCT: 32 % — ABNORMAL LOW (ref 39.0–52.0)
HCT: 31 % — ABNORMAL LOW (ref 39.0–52.0)
HCT: 31 % — ABNORMAL LOW (ref 39.0–52.0)
HCT: 33 % — ABNORMAL LOW (ref 39.0–52.0)
Hemoglobin: 10.9 g/dL — ABNORMAL LOW (ref 13.0–17.0)
Hemoglobin: 10.5 g/dL — ABNORMAL LOW (ref 13.0–17.0)
Hemoglobin: 10.5 g/dL — ABNORMAL LOW (ref 13.0–17.0)
Hemoglobin: 11.2 g/dL — ABNORMAL LOW (ref 13.0–17.0)
Potassium: 4.5 mmol/L (ref 3.5–5.1)
Potassium: 5 mmol/L (ref 3.5–5.1)
Potassium: 5.2 mmol/L — ABNORMAL HIGH (ref 3.5–5.1)
Potassium: 5 mmol/L (ref 3.5–5.1)
Sodium: 141 mmol/L (ref 135–145)
Sodium: 139 mmol/L (ref 135–145)
Sodium: 139 mmol/L (ref 135–145)
Sodium: 140 mmol/L (ref 135–145)
TCO2: 22 mmol/L (ref 22–32)
TCO2: 22 mmol/L (ref 22–32)
TCO2: 23 mmol/L (ref 22–32)
TCO2: 28 mmol/L (ref 22–32)

## 2019-12-13 LAB — ABO/RH: ABO/RH(D): A POS

## 2019-12-13 LAB — GLUCOSE, CAPILLARY
Glucose-Capillary: 214 mg/dL — ABNORMAL HIGH (ref 70–99)
Glucose-Capillary: 237 mg/dL — ABNORMAL HIGH (ref 70–99)

## 2019-12-13 LAB — PREPARE RBC (CROSSMATCH)

## 2019-12-13 SURGERY — IMPLANTATION, AORTIC VALVE, TRANSCATHETER, FEMORAL APPROACH
Anesthesia: Monitor Anesthesia Care | Site: Chest

## 2019-12-13 MED ORDER — FENTANYL CITRATE (PF) 250 MCG/5ML IJ SOLN
INTRAMUSCULAR | Status: AC
  Filled 2019-12-13: qty 5

## 2019-12-13 MED ORDER — ACETAMINOPHEN 650 MG RE SUPP: 650.0000 mg | Freq: Four times a day (QID) | RECTAL | Status: DC | PRN

## 2019-12-13 MED ORDER — SODIUM CHLORIDE 0.9% FLUSH
3.0000 mL | Freq: Two times a day (BID) | INTRAVENOUS | Status: DC
  Administered 2019-12-13: 3 mL via INTRAVENOUS

## 2019-12-13 MED ORDER — CHLORHEXIDINE GLUCONATE 4 % EX LIQD: 60.0000 mL | Freq: Once | CUTANEOUS | Status: DC

## 2019-12-13 MED ORDER — HEPARIN SODIUM (PORCINE) 1000 UNIT/ML IJ SOLN
INTRAMUSCULAR | Status: DC | PRN
  Administered 2019-12-13: 12000 [IU] via INTRAVENOUS

## 2019-12-13 MED ORDER — INSULIN ASPART 100 UNIT/ML ~~LOC~~ SOLN: 0.0000 [IU] | Freq: Three times a day (TID) | SUBCUTANEOUS | Status: DC

## 2019-12-13 MED ORDER — PANTOPRAZOLE SODIUM 40 MG PO TBEC
40.0000 mg | DELAYED_RELEASE_TABLET | Freq: Every day | ORAL | Status: DC
  Administered 2019-12-13 – 2019-12-14 (×2): 40 mg via ORAL
  Filled 2019-12-13 (×2): qty 1

## 2019-12-13 MED ORDER — LIDOCAINE HCL 1 % IJ SOLN
INTRAMUSCULAR | Status: DC | PRN
  Administered 2019-12-13: 20 mL

## 2019-12-13 MED ORDER — MIDAZOLAM HCL 5 MG/5ML IJ SOLN
INTRAMUSCULAR | Status: DC | PRN
  Administered 2019-12-13: 1 mg via INTRAVENOUS

## 2019-12-13 MED ORDER — LACTATED RINGERS IV SOLN: INTRAVENOUS | Status: DC | PRN

## 2019-12-13 MED ORDER — ASPIRIN EC 81 MG PO TBEC
81.0000 mg | DELAYED_RELEASE_TABLET | Freq: Every day | ORAL | Status: DC
  Administered 2019-12-14: 81 mg via ORAL
  Filled 2019-12-13: qty 1

## 2019-12-13 MED ORDER — SIMVASTATIN 20 MG PO TABS
20.0000 mg | ORAL_TABLET | Freq: Every evening | ORAL | Status: DC
  Administered 2019-12-13: 20 mg via ORAL
  Filled 2019-12-13: qty 1

## 2019-12-13 MED ORDER — PROTAMINE SULFATE 10 MG/ML IV SOLN
INTRAVENOUS | Status: DC | PRN
  Administered 2019-12-13: 120 mg via INTRAVENOUS

## 2019-12-13 MED ORDER — SODIUM CHLORIDE 0.9 % IV SOLN: INTRAVENOUS | Status: DC

## 2019-12-13 MED ORDER — LIDOCAINE HCL 1 % IJ SOLN
INTRAMUSCULAR | Status: AC
  Filled 2019-12-13: qty 20

## 2019-12-13 MED ORDER — SODIUM CHLORIDE 0.9 % IV SOLN: 250.0000 mL | INTRAVENOUS | Status: DC | PRN

## 2019-12-13 MED ORDER — VANCOMYCIN HCL IN DEXTROSE 1-5 GM/200ML-% IV SOLN
1000.0000 mg | Freq: Once | INTRAVENOUS | Status: AC
  Administered 2019-12-13: 1000 mg via INTRAVENOUS
  Filled 2019-12-13: qty 200

## 2019-12-13 MED ORDER — CHLORHEXIDINE GLUCONATE 0.12 % MT SOLN
15.0000 mL | Freq: Once | OROMUCOSAL | Status: AC
  Administered 2019-12-13: 15 mL via OROMUCOSAL
  Filled 2019-12-13: qty 15

## 2019-12-13 MED ORDER — CHLORHEXIDINE GLUCONATE 4 % EX LIQD: 30.0000 mL | CUTANEOUS | Status: DC

## 2019-12-13 MED ORDER — PROPOFOL 500 MG/50ML IV EMUL
INTRAVENOUS | Status: DC | PRN
  Administered 2019-12-13: 10 ug/kg/min via INTRAVENOUS

## 2019-12-13 MED ORDER — MORPHINE SULFATE (PF) 2 MG/ML IV SOLN: 1.0000 mg | INTRAVENOUS | Status: DC | PRN

## 2019-12-13 MED ORDER — NOREPINEPHRINE 4 MG/250ML-% IV SOLN
INTRAVENOUS | Status: DC | PRN
  Administered 2019-12-13: 1 ug/min via INTRAVENOUS

## 2019-12-13 MED ORDER — PROPOFOL 10 MG/ML IV BOLUS
INTRAVENOUS | Status: AC
  Filled 2019-12-13: qty 20

## 2019-12-13 MED ORDER — SODIUM CHLORIDE 0.9 % IV SOLN
1.5000 g | Freq: Two times a day (BID) | INTRAVENOUS | Status: DC
  Administered 2019-12-13 – 2019-12-14 (×2): 1.5 g via INTRAVENOUS
  Filled 2019-12-13 (×4): qty 1.5

## 2019-12-13 MED ORDER — ONDANSETRON HCL 4 MG/2ML IJ SOLN
INTRAMUSCULAR | Status: DC | PRN
  Administered 2019-12-13: 4 mg via INTRAVENOUS

## 2019-12-13 MED ORDER — DEXMEDETOMIDINE HCL IN NACL 400 MCG/100ML IV SOLN
INTRAVENOUS | Status: DC | PRN
  Administered 2019-12-13: 42 ug via INTRAVENOUS
  Administered 2019-12-13: 1 ug/kg/h via INTRAVENOUS

## 2019-12-13 MED ORDER — SODIUM CHLORIDE 0.9 % IV SOLN
INTRAVENOUS | Status: DC | PRN
  Administered 2019-12-13: 1500 mL via INTRAMUSCULAR

## 2019-12-13 MED ORDER — EZETIMIBE-SIMVASTATIN 10-20 MG PO TABS
1.0000 | ORAL_TABLET | Freq: Every day | ORAL | Status: DC
  Filled 2019-12-13 (×2): qty 1

## 2019-12-13 MED ORDER — SODIUM CHLORIDE 0.9 % IV BOLUS: 500.0000 mL | Freq: Once | INTRAVENOUS | Status: DC

## 2019-12-13 MED ORDER — MIDAZOLAM HCL 2 MG/2ML IJ SOLN
INTRAMUSCULAR | Status: AC
  Filled 2019-12-13: qty 2

## 2019-12-13 MED ORDER — CLOPIDOGREL BISULFATE 75 MG PO TABS
75.0000 mg | ORAL_TABLET | Freq: Every day | ORAL | Status: DC
  Administered 2019-12-14: 75 mg via ORAL
  Filled 2019-12-13: qty 1

## 2019-12-13 MED ORDER — NITROGLYCERIN IN D5W 200-5 MCG/ML-% IV SOLN: 0.0000 ug/min | INTRAVENOUS | Status: DC

## 2019-12-13 MED ORDER — IODIXANOL 320 MG/ML IV SOLN
INTRAVENOUS | Status: DC | PRN
  Administered 2019-12-13: 54.5 mL

## 2019-12-13 MED ORDER — 0.9 % SODIUM CHLORIDE (POUR BTL) OPTIME: TOPICAL | Status: DC | PRN

## 2019-12-13 MED ORDER — PHENYLEPHRINE HCL-NACL 20-0.9 MG/250ML-% IV SOLN
0.0000 ug/min | INTRAVENOUS | Status: DC
  Filled 2019-12-13: qty 250

## 2019-12-13 MED ORDER — OXYCODONE HCL 5 MG PO TABS: 5.0000 mg | ORAL_TABLET | ORAL | Status: DC | PRN

## 2019-12-13 MED ORDER — FENTANYL CITRATE (PF) 250 MCG/5ML IJ SOLN
INTRAMUSCULAR | Status: DC | PRN
  Administered 2019-12-13: 25 ug via INTRAVENOUS

## 2019-12-13 MED ORDER — SODIUM CHLORIDE 0.9% FLUSH: 3.0000 mL | INTRAVENOUS | Status: DC | PRN

## 2019-12-13 MED ORDER — SODIUM CHLORIDE 0.9 % IV SOLN
INTRAVENOUS | Status: AC
  Filled 2019-12-13 (×3): qty 1.2

## 2019-12-13 MED ORDER — ONDANSETRON HCL 4 MG/2ML IJ SOLN: 4.0000 mg | Freq: Four times a day (QID) | INTRAMUSCULAR | Status: DC | PRN

## 2019-12-13 MED ORDER — EZETIMIBE 10 MG PO TABS
10.0000 mg | ORAL_TABLET | Freq: Every evening | ORAL | Status: DC
  Administered 2019-12-13: 10 mg via ORAL
  Filled 2019-12-13: qty 1

## 2019-12-13 MED ORDER — TRAMADOL HCL 50 MG PO TABS: 50.0000 mg | ORAL_TABLET | ORAL | Status: DC | PRN

## 2019-12-13 MED ORDER — ACETAMINOPHEN 325 MG PO TABS: 650.0000 mg | ORAL_TABLET | Freq: Four times a day (QID) | ORAL | Status: DC | PRN

## 2019-12-13 SURGICAL SUPPLY — 87 items
ADH SKN CLS APL DERMABOND .7 (GAUZE/BANDAGES/DRESSINGS) ×2
APL PRP STRL LF DISP 70% ISPRP (MISCELLANEOUS) ×2
BAG DECANTER FOR FLEXI CONT (MISCELLANEOUS) IMPLANT
BAG SNAP BAND KOVER 36X36 (MISCELLANEOUS) ×3 IMPLANT
BLADE CLIPPER SURG (BLADE) IMPLANT
BLADE OSCILLATING /SAGITTAL (BLADE) IMPLANT
BLADE STERNUM SYSTEM 6 (BLADE) IMPLANT
BLADE SURG 10 STRL SS (BLADE) IMPLANT
CABLE ADAPT CONN TEMP 6FT (ADAPTER) ×3 IMPLANT
CATH DIAG EXPO 6F AL1 (CATHETERS) IMPLANT
CATH DIAG EXPO 6F VENT PIG 145 (CATHETERS) ×6 IMPLANT
CATH EXTERNAL FEMALE PUREWICK (CATHETERS) IMPLANT
CATH INFINITI 6F AL2 (CATHETERS) IMPLANT
CATH S G BIP PACING (CATHETERS) ×4 IMPLANT
CATH TEMPO TEMP PACE LEAD (CATHETERS) IMPLANT
CATHETER TEMPO TEMP PACE LEAD (CATHETERS) ×3
CHLORAPREP W/TINT 26 (MISCELLANEOUS) ×3 IMPLANT
CLIP VESOCCLUDE MED 24/CT (CLIP) IMPLANT
CLIP VESOCCLUDE SM WIDE 24/CT (CLIP) IMPLANT
CLOSURE MYNX CONTROL 6F/7F (Vascular Products) ×1 IMPLANT
CNTNR URN SCR LID CUP LEK RST (MISCELLANEOUS) ×4 IMPLANT
CONT SPEC 4OZ STRL OR WHT (MISCELLANEOUS) ×6
COVER BACK TABLE 80X110 HD (DRAPES) ×3 IMPLANT
COVER WAND RF STERILE (DRAPES) ×3 IMPLANT
DECANTER SPIKE VIAL GLASS SM (MISCELLANEOUS) ×3 IMPLANT
DERMABOND ADVANCED (GAUZE/BANDAGES/DRESSINGS) ×1
DERMABOND ADVANCED .7 DNX12 (GAUZE/BANDAGES/DRESSINGS) ×2 IMPLANT
DEVICE CLOSURE PERCLS PRGLD 6F (VASCULAR PRODUCTS) ×4 IMPLANT
DRAPE INCISE IOBAN 66X45 STRL (DRAPES) IMPLANT
DRSG TEGADERM 4X4.75 (GAUZE/BANDAGES/DRESSINGS) ×5 IMPLANT
ELECT CAUTERY BLADE 6.4 (BLADE) IMPLANT
ELECT REM PT RETURN 9FT ADLT (ELECTROSURGICAL) ×6
ELECTRODE REM PT RTRN 9FT ADLT (ELECTROSURGICAL) ×4 IMPLANT
FELT TEFLON 6X6 (MISCELLANEOUS) IMPLANT
GAUZE SPONGE 4X4 12PLY STRL (GAUZE/BANDAGES/DRESSINGS) ×3 IMPLANT
GAUZE SPONGE 4X4 12PLY STRL LF (GAUZE/BANDAGES/DRESSINGS) ×1 IMPLANT
GLOVE BIO SURGEON STRL SZ7.5 (GLOVE) ×3 IMPLANT
GLOVE BIO SURGEON STRL SZ8 (GLOVE) IMPLANT
GLOVE EUDERMIC 7 POWDERFREE (GLOVE) IMPLANT
GLOVE ORTHO TXT STRL SZ7.5 (GLOVE) IMPLANT
GOWN STRL REUS W/ TWL LRG LVL3 (GOWN DISPOSABLE) IMPLANT
GOWN STRL REUS W/ TWL XL LVL3 (GOWN DISPOSABLE) ×2 IMPLANT
GOWN STRL REUS W/TWL LRG LVL3 (GOWN DISPOSABLE)
GOWN STRL REUS W/TWL XL LVL3 (GOWN DISPOSABLE) ×3
GUIDEWIRE SAFE TJ AMPLATZ EXST (WIRE) ×3 IMPLANT
INSERT FOGARTY SM (MISCELLANEOUS) IMPLANT
KIT BASIN OR (CUSTOM PROCEDURE TRAY) ×3 IMPLANT
KIT HEART LEFT (KITS) ×3 IMPLANT
KIT SUCTION CATH 14FR (SUCTIONS) IMPLANT
KIT TURNOVER KIT B (KITS) ×3 IMPLANT
LOOP VESSEL MAXI BLUE (MISCELLANEOUS) IMPLANT
LOOP VESSEL MINI RED (MISCELLANEOUS) IMPLANT
NS IRRIG 1000ML POUR BTL (IV SOLUTION) ×3 IMPLANT
PACK ENDO MINOR (CUSTOM PROCEDURE TRAY) ×3 IMPLANT
PAD ARMBOARD 7.5X6 YLW CONV (MISCELLANEOUS) ×6 IMPLANT
PAD ELECT DEFIB RADIOL ZOLL (MISCELLANEOUS) ×3 IMPLANT
PENCIL BUTTON HOLSTER BLD 10FT (ELECTRODE) IMPLANT
PERCLOSE PROGLIDE 6F (VASCULAR PRODUCTS) ×6
POSITIONER HEAD DONUT 9IN (MISCELLANEOUS) ×3 IMPLANT
SET MICROPUNCTURE 5F STIFF (MISCELLANEOUS) ×3 IMPLANT
SHEATH BRITE TIP 7FR 35CM (SHEATH) ×3 IMPLANT
SHEATH PINNACLE 6F 10CM (SHEATH) ×3 IMPLANT
SHEATH PINNACLE 8F 10CM (SHEATH) ×3 IMPLANT
SLEEVE REPOSITIONING LENGTH 30 (MISCELLANEOUS) ×3 IMPLANT
STOPCOCK MORSE 400PSI 3WAY (MISCELLANEOUS) ×6 IMPLANT
SUT ETHIBOND X763 2 0 SH 1 (SUTURE) IMPLANT
SUT GORETEX CV 4 TH 22 36 (SUTURE) IMPLANT
SUT GORETEX CV4 TH-18 (SUTURE) IMPLANT
SUT MNCRL AB 3-0 PS2 18 (SUTURE) IMPLANT
SUT PROLENE 5 0 C 1 36 (SUTURE) IMPLANT
SUT PROLENE 6 0 C 1 30 (SUTURE) IMPLANT
SUT SILK  1 MH (SUTURE) ×3
SUT SILK 1 MH (SUTURE) ×2 IMPLANT
SUT VIC AB 2-0 CT1 27 (SUTURE)
SUT VIC AB 2-0 CT1 TAPERPNT 27 (SUTURE) IMPLANT
SUT VIC AB 2-0 CTX 36 (SUTURE) IMPLANT
SUT VIC AB 3-0 SH 8-18 (SUTURE) IMPLANT
SYR 50ML LL SCALE MARK (SYRINGE) ×3 IMPLANT
SYR BULB IRRIG 60ML STRL (SYRINGE) IMPLANT
SYR MEDRAD MARK V 150ML (SYRINGE) ×3 IMPLANT
TOWEL GREEN STERILE (TOWEL DISPOSABLE) ×6 IMPLANT
TRANSDUCER W/STOPCOCK (MISCELLANEOUS) ×6 IMPLANT
TRAY FOLEY SLVR 16FR TEMP STAT (SET/KITS/TRAYS/PACK) IMPLANT
VALVE 23 ULTRA SAPIEN KIT (Valve) ×1 IMPLANT
WIRE EMERALD 3MM-J .035X150CM (WIRE) ×3 IMPLANT
WIRE EMERALD 3MM-J .035X260CM (WIRE) ×3 IMPLANT
WIRE TORQFLEX AUST .018X40CM (WIRE) ×1 IMPLANT

## 2019-12-13 NOTE — Op Note (Signed)
[facility]   MULTIDISCIPLINARY HEART VALVE TEAM   TAVR OPERATIVE NOTE   Date of Procedure:  12/13/2019  Preoperative Diagnosis: Severe Aortic Stenosis   Postoperative Diagnosis: Same   Procedure:    Transcatheter Aortic Valve Replacement - Percutaneous Right Transfemoral Approach  Edwards Sapien 3 Ultra THV (size 23 mm, model # 9750TFX, serial # [id number])   Co-Surgeons:  Gaye Pollack, MD and Lauree Chandler, MD   Anesthesiologist:  Marcie Bal, MD  Echocardiographer:  Croitoru, MD  Pre-operative Echo Findings:  Severe aortic stenosis  Normal left ventricular systolic function  Post-operative Echo Findings:  No paravalvular leak  Normal left ventricular systolic function   BRIEF CLINICAL NOTE AND INDICATIONS FOR SURGERY  This 79 year old gentleman has stage D, severe, symptomatic aortic stenosis with New York Heart Association class Il symptoms of exertional chest burning and mild shortness of breath with walking up hills. I have personally reviewed his 2D echocardiogram, cardiac catheterization, and CTA studies. 2D echocardiogram shows an indeterminate number of cost with severely calcified leaflets and restricted mobility. The mean gradient is 36 mmHg with a valve area of 0.86 cm consistent with severe aortic stenosis. Left ventricular systolic function is normal. Cardiac catheterization showed moderate nonobstructive coronary disease that can be managed medically. The mean gradient across the aortic valve was measured at 44.6 mmHg consistent with severe aortic stenosis. I agree that aortic valve replacement is indicated in this patient for relief of his symptoms and to prevent progressive left ventricular deterioration. Given his age I think transcatheter aortic valve replacement would be a reasonable option for treating his severe aortic stenosis. His gated cardiac CTA shows a functionally bicuspid aortic valve with fusion of the right and left cusp  with heavy calcification of the leaflets and restricted motion. Anatomy is suitable for TAVR although there is significant annular calcification at the base of the right and on commissure as well as calcification extending down into the intravalvular fibrosa which may increase the risk of perivalvular leak. Abdominal and pelvic CTA shows adequate pelvic vascular anatomy to allow transfemoral insertion.  The patient and his wife were counseled at length regarding treatment alternatives for management of severe symptomatic aortic stenosis. The risks and benefits of surgical intervention has been discussed in detail. Long-term prognosis with medical therapy was discussed. Alternative approaches such as conventional surgical aortic valve replacement, transcatheter aortic valve replacement, and palliative medical therapy were compared and contrasted at length. This discussion was placed in the context of the patient's own specific clinical presentation and past medical history. All of their questions have been addressed. I reviewed the CTA images with the patient and his wife. I discussed the calcification of his annulus and the increased risk of perivalvular leak. He understands this and wants to proceed with transcatheter aortic valve replacement instead of open surgical AVR.  Following the decision to proceed with transcatheter aortic valve replacement, a discussion was held regarding what types of management strategies would be attempted intraoperatively in the event of life-threatening complications, including whether or not the patient would be considered a candidate for the use of cardiopulmonary bypass and/or conversion to open sternotomy for attempted surgical intervention. The patient is aware of the fact that transient use of cardiopulmonary bypass may be necessary. I think he is a candidate for emergent sternotomy if needed to manage any intraoperative complications and I discussed that with the patient.  The patient has been advised of a variety of complications that might develop including but not limited  to risks of death, stroke, paravalvular leak, aortic dissection or other major vascular complications, aortic annulus rupture, device embolization, cardiac rupture or perforation, mitral regurgitation, acute myocardial infarction, arrhythmia, heart block or bradycardia requiring permanent pacemaker placement, congestive heart failure, respiratory failure, renal failure, pneumonia, infection, other late complications related to structural valve deterioration or migration, or other complications that might ultimately cause a temporary or permanent loss of functional independence or other long term morbidity. The patient provides full informed consent for the procedure as described and all questions were answered.     DETAILS OF THE OPERATIVE PROCEDURE  PREPARATION:    The patient was brought to the operating room on the above mentioned date and appropriate monitoring was established by the anesthesia team. The patient was placed in the supine position on the operating table.  Intravenous antibiotics were administered. The patient was monitored closely throughout the procedure under conscious sedation.  Baseline transthoracic echocardiogram was performed. The patient's abdomen and both groins were prepped and draped in a sterile manner. A time out procedure was performed.   PERIPHERAL ACCESS:    Using the modified Seldinger technique, femoral arterial and venous access was obtained with placement of 6 Fr sheaths on the left side.  A pigtail diagnostic catheter was passed through the left arterial sheath under fluoroscopic guidance into the aortic root.  A temporary transvenous pacemaker catheter was passed through the left femoral venous sheath under fluoroscopic guidance into the right ventricle.  The pacemaker was tested to ensure stable lead placement and pacemaker capture. Aortic root angiography  was performed in order to determine the optimal angiographic angle for valve deployment.   TRANSFEMORAL ACCESS:   Percutaneous transfemoral access and sheath placement was performed using ultrasound guidance.  The right common femoral artery was cannulated using a micropuncture needle and appropriate location was verified using hand injection angiogram.  A pair of Abbott Perclose percutaneous closure devices were placed and a 6 French sheath replaced into the femoral artery.  The patient was heparinized systemically and ACT verified > 250 seconds.    A 14 Fr transfemoral E-sheath was introduced into the right common femoral artery after progressively dilating over an Amplatz superstiff wire. An AL-2 catheter was used to direct a straight-tip exchange length wire across the native aortic valve into the left ventricle. This was exchanged out for a pigtail catheter and position was confirmed in the LV apex. Simultaneous LV and Ao pressures were recorded.  The pigtail catheter was exchanged for an Amplatz Extra-stiff wire in the LV apex.     BALLOON AORTIC VALVULOPLASTY:   Not performed   TRANSCATHETER HEART VALVE DEPLOYMENT:   An Edwards Sapien 3 Ultra transcatheter heart valve (size 23 mm) was prepared and crimped per manufacturer's guidelines, and the proper orientation of the valve is confirmed on the [employer] delivery system. One cc of extra contrast was put in the balloon. The valve was advanced through the introducer sheath using normal technique until in an appropriate position in the abdominal aorta beyond the sheath tip. The balloon was then retracted and using the fine-tuning wheel was centered on the valve. The valve was then advanced across the aortic arch using appropriate flexion of the catheter. The valve was carefully positioned across the aortic valve annulus. The Commander catheter was retracted using normal technique. Once final position of the valve has been confirmed by  angiographic assessment, the valve is deployed while temporarily holding ventilation and during rapid ventricular pacing to maintain systolic blood pressure <  50 mmHg and pulse pressure < 10 mmHg. The balloon inflation is held for >3 seconds after reaching full deployment volume. Once the balloon has fully deflated the balloon is retracted into the ascending aorta and valve function is assessed using echocardiography. There is felt to be 1+ paravalvular leak next to an area of annular calcification. Therefore one additional cc ( total 2 cc extra) was added to the balloon and post-deployment dilation was performed under rapid ventricular pacing. Echo showed complete resolution of the paravalvular leak and no central aortic insufficiency. Post-deployment mean gradient was 6 mm Hg. The patient's hemodynamic recovery following valve deployment is good.  The deployment balloon and guidewire are both removed.    PROCEDURE COMPLETION:   The sheath was removed and femoral artery closure performed.  Protamine was administered once femoral arterial repair was complete. The temporary pacemaker, pigtail catheters and femoral sheaths were removed with manual pressure used for hemostasis.  A Mynx femoral closure device was utilized following removal of the diagnostic sheath in the left femoral artery.  The patient tolerated the procedure well and is transported to the cath lab recovery area in stable condition. There were no immediate intraoperative complications. All sponge instrument and needle counts are verified correct at completion of the operation.   No blood products were administered during the operation.  The patient received a total of 54.5 mL of intravenous contrast during the procedure.   Gaye Pollack, MD 12/13/2019

## 2019-12-13 NOTE — Anesthesia Procedure Notes (Signed)
Procedure Name: MAC Date/Time: 12/13/2019 7:45 AM Performed by: Thelma Comp, CRNA Pre-anesthesia Checklist: Patient identified, Emergency Drugs available, Suction available and Patient being monitored Patient Re-evaluated:Patient Re-evaluated prior to induction Oxygen Delivery Method: Simple face mask

## 2019-12-13 NOTE — Interval H&P Note (Signed)
History and Physical Interval Note:  12/13/2019 6:58 AM  Barry Zavala  has presented today for surgery, with the diagnosis of Severe Aortic Stenosis.  The various methods of treatment have been discussed with the patient and family. After consideration of risks, benefits and other options for treatment, the patient has consented to  Procedure(s): TRANSCATHETER AORTIC VALVE REPLACEMENT, TRANSFEMORAL (N/A) TRANSESOPHAGEAL ECHOCARDIOGRAM (TEE) (N/A) as a surgical intervention.  The patient's history has been reviewed, patient examined, no change in status, stable for surgery.  I have reviewed the patient's chart and labs.  Questions were answered to the patient's satisfaction.     Gaye Pollack

## 2019-12-13 NOTE — Progress Notes (Signed)
  [facility] TE[facility]  Patient doing well s/p TAVR. He is hemodynamically stable. Groin sites stable. 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 There is no high grade block. Arterial line discontinued, tempo removed and transferred to 4E. Plan for early ambulation after bedrest completed and hopeful discharge over the next 24-48 hours with ambulatory telemetry.   Angelena Form PA-C  [facility]  Pager [phone]

## 2019-12-13 NOTE — Progress Notes (Signed)
Site area: left groin fv sheath Site Prior to Removal:  Level 0 Pressure Applied For: 10 minutes Manual:   yes Patient Status During Pull:  stable Post Pull Site:  Level 0 Post Pull Instructions Given:  yes Post Pull Pulses Present: left dp 1+ Dressing Applied:  Gauze and tegaderm Bedrest begins @ 1300 Comments:

## 2019-12-13 NOTE — Anesthesia Postprocedure Evaluation (Signed)
Anesthesia Post Note  Patient: Torell Minder  Procedure(s) Performed: TRANSCATHETER AORTIC VALVE REPLACEMENT, TRANSFEMORAL (N/A Chest) TRANSESOPHAGEAL ECHOCARDIOGRAM (TEE) (N/A )     Patient location during evaluation: PACU Anesthesia Type: MAC Level of consciousness: awake and alert Pain management: pain level controlled Vital Signs Assessment: post-procedure vital signs reviewed and stable Respiratory status: spontaneous breathing, nonlabored ventilation, respiratory function stable and patient connected to nasal cannula oxygen Cardiovascular status: stable and blood pressure returned to baseline Postop Assessment: no apparent nausea or vomiting Anesthetic complications: no   No complications documented.  Last Vitals:  Vitals:   12/13/19 1415 12/13/19 1430  BP: 108/81 (!) 127/52  Pulse: 60   Resp: (!) 22   Temp:    SpO2: 94%     Last Pain:  Vitals:   12/13/19 1341  TempSrc: Oral  PainSc:                  Sebastian

## 2019-12-13 NOTE — Transfer of Care (Signed)
Immediate Anesthesia Transfer of Care Note  Patient: Barry Zavala  Procedure(s) Performed: TRANSCATHETER AORTIC VALVE REPLACEMENT, TRANSFEMORAL (N/A Chest) TRANSESOPHAGEAL ECHOCARDIOGRAM (TEE) (N/A )  Patient Location: Cath Lab  Anesthesia Type:MAC  Level of Consciousness: drowsy and patient cooperative  Airway & Oxygen Therapy: Patient Spontanous Breathing and Patient connected to face mask oxygen  Post-op Assessment: Report given to RN, Post -op Vital signs reviewed and stable and Patient moving all extremities X 4  Post vital signs: Reviewed and stable  Last Vitals:  Vitals Value Taken Time  BP    Temp    Pulse    Resp    SpO2      Last Pain:  Vitals:   12/13/19 0616  TempSrc: Oral  PainSc:          Complications: No complications documented.

## 2019-12-13 NOTE — Progress Notes (Signed)
Barry Zavala removed left femoral temp pacer wire

## 2019-12-13 NOTE — CV Procedure (Signed)
[facility]  TAVR OPERATIVE NOTE   Date of Procedure:  12/13/2019  Preoperative Diagnosis: Severe Aortic Stenosis   Postoperative Diagnosis: Same   Procedure:    Transcatheter Aortic Valve Replacement - Transfemoral Approach  Edwards Sapien 3 THV (size 23 mm, model #[id number], serial # [id number])   Co-Surgeons:  Lauree Chandler, MD and Gaye Pollack, MD   Anesthesiologist:  Hodierne  Echocardiographer:  Croitoru  Pre-operative Echo Findings:  Severe aortic stenosis  Normal left ventricular systolic function  Post-operative Echo Findings:  No paravalvular leak  Normal left ventricular systolic function  BRIEF CLINICAL NOTE AND INDICATIONS FOR SURGERY  79 yo male with history of anemia, arthritis, CAD, GERD, hyperlipidemia, diabetes mellitus, RBBB and severe aortic stenosis who is here today for TAVR. I saw him as a new consult in the valve clinic in February 2021. He is known to have CAD with MI in 1995 and PCI in 2001. Cardiac cath 02/02/19 with patent LAD stent, moderate LAD stenosis, patent Circumflex stent, moderately severe to severe distal RCA stenosis. Echo 01/13/19 with LVEF=60-65%. The aortic valve leaflets are thickened and calcified with limited mobility. Mean gradient 43 mmHg, peak gradient 71.2 mmHg, AVA 0.8 cm2, dimensionless index 0.28. He has chronic kidney disease with baseline creatinine of 1.5. He was asymptomatic when I met him in February 2021 so we did not move forward with planning for TAVR at that time. Echo July 2021 with LVEF=60-65%. The aortic valve leaflets are thickened and calcified with limited leaflet excursion. Mean gradient 36 mmHg, peak gradient 73.6 mmHg, AVA 0.75 cm2. Dimensionless index 0.27. Last visit in my office August 2021 and he remained asymptomatic. He saw Dr. Tamala Julian 11/03/19 and c/o exertional dyspnea and burning in his chest. Gated cardiac CT 11/09/19 with suitable annulus for a 26 mm Edwards Sapien 3 valve with area of  443 mm2. AV calcium score 2964 c/w severe AS. His aortic root was normal size. He has favorable access for transfemoral approach to TAVR. Cardiac cath with moderate stenosis RCA, not flow limiting by DFR.  During the course of the patient's preoperative work up they have been evaluated comprehensively by a multidisciplinary team of specialists coordinated through the [facility] in the [facility].  They have been demonstrated to suffer from symptomatic severe aortic stenosis as noted above. The patient has been counseled extensively as to the relative risks and benefits of all options for the treatment of severe aortic stenosis including long term medical therapy, conventional surgery for aortic valve replacement, and transcatheter aortic valve replacement.  The patient has been independently evaluated by Dr. Cyndia Bent with CT surgery and they are felt to be at high risk for conventional surgical aortic valve replacement. The surgeon indicated the patient would be a poor candidate for conventional surgery. Based upon review of all of the patient's preoperative diagnostic tests they are felt to be candidate for transcatheter aortic valve replacement using the transfemoral approach as an alternative to high risk conventional surgery.    Following the decision to proceed with transcatheter aortic valve replacement, a discussion has been held regarding what types of management strategies would be attempted intraoperatively in the event of life-threatening complications, including whether or not the patient would be considered a candidate for the use of cardiopulmonary bypass and/or conversion to open sternotomy for attempted surgical intervention.  The patient has been advised of a variety of complications that might develop peculiar to this approach including but not  limited to risks of death, stroke, paravalvular leak, aortic dissection or other major vascular  complications, aortic annulus rupture, device embolization, cardiac rupture or perforation, acute myocardial infarction, arrhythmia, heart block or bradycardia requiring permanent pacemaker placement, congestive heart failure, respiratory failure, renal failure, pneumonia, infection, other late complications related to structural valve deterioration or migration, or other complications that might ultimately cause a temporary or permanent loss of functional independence or other long term morbidity.  The patient provides full informed consent for the procedure as described and all questions were answered preoperatively.    DETAILS OF THE OPERATIVE PROCEDURE  PREPARATION:   The patient is brought to the operating room on the above mentioned date and central monitoring was established by the anesthesia team including placement of a radial arterial line. The patient is placed in the supine position on the operating table.  Intravenous antibiotics are administered. Conscious sedation is used.   Baseline transthoracic echocardiogram was performed. The patient's chest, abdomen, both groins, and both lower extremities are prepared and draped in a sterile manner. A time out procedure is performed.   PERIPHERAL ACCESS:   Using the modified Seldinger technique, femoral arterial and venous access were obtained with placement of 6 Fr sheaths on the left side using u/s guidance  A pigtail diagnostic catheter was passed through the femoral arterial sheath under fluoroscopic guidance into the aortic root.  A temporary transvenous pacemaker catheter (Tempo) was passed through the femoral venous sheath under fluoroscopic guidance into the right ventricle.  The pacemaker was tested to ensure stable lead placement and pacemaker capture. Aortic root angiography was performed in order to determine the optimal angiographic angle for valve deployment.  TRANSFEMORAL ACCESS:  A micropuncture kit was used to gain access to the  right femoral artery using u/s guidance. Position confirmed with angiography. Pre-closure with double ProGlide closure devices. The patient was heparinized systemically and ACT verified > 250 seconds.    A 14 Fr transfemoral E-sheath was introduced into the right femoral artery after progressively dilating over an Amplatz superstiff wire. An AL-2 catheter was used to direct a straight-tip exchange length wire across the native aortic valve into the left ventricle. This was exchanged out for a pigtail catheter and position was confirmed in the LV apex. Simultaneous LV and Ao pressures were recorded.  The pigtail catheter was then exchanged for an Amplatz Extra-stiff wire in the LV apex.   TRANSCATHETER HEART VALVE DEPLOYMENT:  An Edwards Sapien 3 THV (size 23 mm) was prepared and crimped per manufacturer's guidelines, and the proper orientation of the valve is confirmed on the [employer] delivery system. The valve was advanced through the introducer sheath using normal technique until in an appropriate position in the abdominal aorta beyond the sheath tip. The balloon was then retracted and using the fine-tuning wheel was centered on the valve. The valve was then advanced across the aortic arch using appropriate flexion of the catheter. The valve was carefully positioned across the aortic valve annulus. The Commander catheter was retracted using normal technique. Once final position of the valve has been confirmed by angiographic assessment, the valve is deployed while temporarily holding ventilation and during rapid ventricular pacing to maintain systolic blood pressure < 50 mmHg and pulse pressure < 10 mmHg. The balloon inflation is held for >3 seconds after reaching full deployment volume. Once the balloon has fully deflated the balloon is retracted into the ascending aorta and valve function is assessed using TTE. Mild AI. We elected to place  an additional cc of fluid in the system and with rapid  pacing performed another dilatation of the valve. There is felt to be noparavalvular leak and no central aortic insufficiency.  The patient's hemodynamic recovery following valve deployment is good.  The deployment balloon and guidewire are both removed. Echo demostrated acceptable post-procedural gradients, stable mitral valve function, and no AI.   PROCEDURE COMPLETION:  The sheath was then removed and closure devices were completed. Protamine was administered once femoral arterial repair was complete. The temporary pacemaker, pigtail catheters and femoral sheaths were removed with a Mynx closure devices placed in the artery and manual pressure used for venous hemostasis.    The patient tolerated the procedure well and is transported to the surgical intensive care in stable condition. There were no immediate intraoperative complications. All sponge instrument and needle counts are verified correct at completion of the operation.   No blood products were administered during the operation.  The patient received a total of 54.5 mL of intravenous contrast during the procedure.  Lauree Chandler MD 12/13/2019 10:07 AM

## 2019-12-13 NOTE — Anesthesia Procedure Notes (Addendum)
Arterial Line Insertion Start/End12/07/2019 7:05 AM, 12/13/2019 7:10 AM Performed by: Albertha Ghee, MD, anesthesiologist  Patient location: Pre-op. Preanesthetic checklist: patient identified, IV checked, site marked, risks and benefits discussed, surgical consent, monitors and equipment checked, pre-op evaluation, timeout performed and anesthesia consent Lidocaine 1% used for infiltration Right, radial was placed Catheter size: 20 Fr Hand hygiene performed , maximum sterile barriers used  and Seldinger technique used Allen's test indicative of satisfactory collateral circulation Attempts: 1 Procedure performed using ultrasound guided technique. Ultrasound Notes:anatomy identified and needle tip was noted to be adjacent to the nerve/plexus identified Following insertion, dressing applied and Biopatch. Post procedure assessment: normal and unchanged  Patient tolerated the procedure well with no immediate complications.

## 2019-12-13 NOTE — Progress Notes (Signed)
Site: right radial Sheath Size: art line Condition prior to removal:  Level o Type of pressure held: manual Time pressure held: 10 minutes Status of patient during pull: stable Condition of site post pull:  Level 0 Type of dressing applied: transparent Pulses verified: yes Patient's condition post pull: stable Bedrest begins at; N/A Post instructions given to patient: yes

## 2019-12-13 NOTE — Progress Notes (Signed)
  Echocardiogram 2D Echocardiogram limited TAVR has been performed.  Darlina Sicilian M 12/13/2019, 10:07 AM

## 2019-12-13 NOTE — Progress Notes (Signed)
Patient ID: Barry Zavala, male   DOB: 1940/06/27, 79 y.o.   MRN: [id number] TAVR Team Note  He is hemodynamically stable in sinus rhythm 70's. RBBB unchanged from preop.  No high grade heart block on monitor.  sats 97% on RA.  Awake and alert Groin sites ok  Doing well postop.

## 2019-12-14 ENCOUNTER — Inpatient Hospital Stay (INDEPENDENT_AMBULATORY_CARE_PROVIDER_SITE_OTHER): Payer: Medicare Other

## 2019-12-14 ENCOUNTER — Inpatient Hospital Stay (HOSPITAL_COMMUNITY): Payer: Medicare Other

## 2019-12-14 ENCOUNTER — Other Ambulatory Visit (HOSPITAL_COMMUNITY): Payer: Self-pay | Admitting: Physician Assistant

## 2019-12-14 ENCOUNTER — Encounter (HOSPITAL_COMMUNITY): Payer: Self-pay | Admitting: Cardiovascular Disease

## 2019-12-14 DIAGNOSIS — I350 Nonrheumatic aortic (valve) stenosis: Principal | ICD-10-CM

## 2019-12-14 DIAGNOSIS — I4510 Unspecified right bundle-branch block: Secondary | ICD-10-CM

## 2019-12-14 DIAGNOSIS — Z952 Presence of prosthetic heart valve: Secondary | ICD-10-CM

## 2019-12-14 LAB — BASIC METABOLIC PANEL
Anion gap: 8 (ref 5–15)
BUN: 29 mg/dL — ABNORMAL HIGH (ref 8–23)
CO2: 21 mmol/L — ABNORMAL LOW (ref 22–32)
Calcium: 8.3 mg/dL — ABNORMAL LOW (ref 8.9–10.3)
Chloride: 108 mmol/L (ref 98–111)
Creatinine, Ser: 1.49 mg/dL — ABNORMAL HIGH (ref 0.61–1.24)
GFR, Estimated: 47 mL/min — ABNORMAL LOW (ref >60)
Glucose, Bld: 250 mg/dL — ABNORMAL HIGH (ref 70–99)
Potassium: 4 mmol/L (ref 3.5–5.1)
Sodium: 137 mmol/L (ref 135–145)

## 2019-12-14 LAB — CBC
HCT: 30.1 % — ABNORMAL LOW (ref 39.0–52.0)
Hemoglobin: 10.5 g/dL — ABNORMAL LOW (ref 13.0–17.0)
MCH: 32 pg (ref 26.0–34.0)
MCHC: 34.9 g/dL (ref 30.0–36.0)
MCV: 91.8 fL (ref 80.0–100.0)
Platelets: 170 10*3/uL (ref 150–400)
RBC: 3.28 MIL/uL — ABNORMAL LOW (ref 4.22–5.81)
RDW: 13.5 % (ref 11.5–15.5)
WBC: 14.6 10*3/uL — ABNORMAL HIGH (ref 4.0–10.5)
nRBC: 0 % (ref 0.0–0.2)

## 2019-12-14 LAB — GLUCOSE, CAPILLARY
Glucose-Capillary: 299 mg/dL — ABNORMAL HIGH (ref 70–99)
Glucose-Capillary: 182 mg/dL — ABNORMAL HIGH (ref 70–99)
Glucose-Capillary: 308 mg/dL — ABNORMAL HIGH (ref 70–99)

## 2019-12-14 LAB — MAGNESIUM: Magnesium: 2 mg/dL (ref 1.7–2.4)

## 2019-12-14 MED ORDER — PANTOPRAZOLE SODIUM 40 MG PO TBEC: 40.0000 mg | DELAYED_RELEASE_TABLET | Freq: Every day | ORAL | 1 refills | Status: DC

## 2019-12-14 MED ORDER — CLOPIDOGREL BISULFATE 75 MG PO TABS: 75.0000 mg | ORAL_TABLET | Freq: Every day | ORAL | 1 refills | Status: DC

## 2019-12-14 MED FILL — PANTOPRAZOLE SOD DR 40 MG T: 40 | 60 days supply | Qty: 60 | Fill #0

## 2019-12-14 MED FILL — CLOPIDOGREL 75 MG TABLET: 75 | 60 days supply | Qty: 60 | Fill #0

## 2019-12-14 NOTE — Discharge Summary (Addendum)
[facility] VALVE TEAM  Discharge Summary    Patient ID: Barry Zavala MRN: [id number]; DOB: 1940-12-06  Admit date: 12/13/2019 Discharge date: 12/14/2019  Primary Care Provider: Josetta Huddle, MD  Primary Cardiologist: Sinclair Grooms, MD / Dr. Angelena Form & Dr. Cyndia Bent (TAVR)  Discharge Diagnoses    Principal Problem:   S/P TAVR (transcatheter aortic valve replacement) Active Problems:   Essential hypertension   Hyperlipidemia   Right bundle branch block   Obesity   Severe aortic stenosis   Coronary artery disease   GERD (gastroesophageal reflux disease)   Type 2 diabetes mellitus (HCC)   Chronic kidney disease (CKD), stage III (moderate) (HCC)   Allergies No Active Allergies  Diagnostic Studies/Procedures     TAVR OPERATIVE NOTE   Date of Procedure:                12/13/2019  Preoperative Diagnosis:      Severe Aortic Stenosis   Postoperative Diagnosis:    Same   Procedure:        Transcatheter Aortic Valve Replacement - Percutaneous Right Transfemoral Approach             Edwards Sapien 3 Ultra THV (size 23 mm, model # 9750TFX, serial # [id number])              Co-Surgeons:                        Gaye Pollack, MD and Lauree Chandler, MD   Anesthesiologist:                  Marcie Bal, MD  Echocardiographer:              Croitoru, MD  Pre-operative Echo Findings: ? Severe aortic stenosis ? Normal left ventricular systolic function  Post-operative Echo Findings: ? No paravalvular leak ? Normal left ventricular systolic function _____________   Echo 12/14/19: complete but pending formal read at the time of discharge    History of Present Illness     Barry Zavala is a 79 y.o. male with a history of HTN, HLD, DMT2, RBBB, CKD stage IIIA, CAD s/p PCI, GERD, obesity and severe AS who presented to [facility] on 12/13/19 for planned TAVR.   Pt has a history coronary artery disease status post MI in  1995 and PCI in 2001.  He had a cardiac cath in January 2021 showing a patent LAD stent with moderate LAD stenosis as well as a patent left circumflex stent.  There was moderate to severe distal RCA stenosis.  An echocardiogram at that time showed severe aortic stenosis with calcified and thickened leaflets and limited mobility. The mean gradient was 43 mmHg with a peak of 71.2 mmHg.  Aortic valve area was 0.8 cm with a dimensionless index of 0.28.  He was seen by Dr. Angelena Form at that time and was asymptomatic and decision was made to continue close follow-up.  He had a follow-up echocardiogram in July 2021 which showed a mean gradient across aortic valve of 36 mmHg with a valve area of 0.75 cm.  He was seen again in August 2021 and remained asymptomatic.  He followed up with Dr. Daneen Schick in October and reported onset of exertional substernal burning with walking up hills associated with mild shortness of breath.  Therefore he underwent further work-up for consideration of TAVR.  He was seen back by Dr. Angelena Form and underwent cardiac catheterization on  11/15/2019 which showed a moderate stenosis of 65% in the mid to distal RCA.  The DFR of this lesion in the mid to distal RCA was 0.97 suggesting that it was not flow-limiting.  There was patent mid left circumflex and obtuse marginal stents.  There is a patent stent in the mid LAD.  There was severe aortic stenosis with a mean gradient of 44.6 mmHg and a peak to peak gradient of 49 mmHg.  Aortic valve area was 0.8 cm.  The patient has been evaluated by the multidisciplinary valve team and felt to have severe, symptomatic aortic stenosis and to be a suitable candidate for TAVR, which was set up for 12/13/19.    Hospital Course     Consultants: none  Severe AS: s/p successful TAVR with a 23 mm Edwards Sapien 3 Ultra THV via the TF approach on 12/13/19. Post operative echo completed but pending formal read. Groin sites are stable. ECG with sinus and old  RBBB. There is no evidence of high grade heart block by ECG or telemetry. Continue Asprin and started on plavix 75 mg daily x 59months. Plan for discharge home today with close follow up in the office next week.    GERD: Prilosec was changed to Protonix given potential drug drug interaction with Plavix  DMT2: treated with SSI while admitted. Resume home meds at discharge. Okay to resume Glyburide-Metformin after 48 hours after contrast dye exposure (12/15/19 PM)   CAD: pre TAVR cath showed moderate stenosis in the mid to distal RCA. Angiographically this is an eccentric lesion. DFR of the lesion in the mid to distal RCA was 0.97 suggesting the lesion was not flow limiting. Patent mid Circumflex stents into the OM. Patent mid LAD stent. Jailed small Diagonal by the mid LAD stent.  Plan for medical management of CAD  RBBB: pt had preexisting RBBB. This has remained stable s/p TAVR. Will plan to DC with a Zio patch to rule out late presenting HAVB.  Incidental findings: pre TAVR CT showed the following which will be discussed in the outpatient setting.   Small pulmonary nodules scattered throughout the lungs bilaterally measuring 4 mm or less in size, nonspecific, but statistically benign. No follow-up needed if patient is low-risk (and has no known or suspected primary neoplasm). Non-contrast chest CT can be considered in 12 months if patient is high-risk.   4 mm hypervascular lesion in segment 2 of the liver, nonspecific but strongly favored to represent a benign lesions such as a small flash fill cavernous hemangioma. This could be definitively characterized with follow-up nonemergent abdominal MRI with and without IV gadolinium if of clinical concern.  _____________  Discharge Vitals Blood pressure (!) 103/51, pulse 63, temperature 98.2 F (36.8 C), temperature source Oral, resp. rate 16, height 5\' 11"  (1.803 m), weight 84.7 kg, SpO2 94 %.  Filed Weights   12/14/19 0500  Weight: 84.7 kg     GEN: Well nourished, well developed, in no acute distress HEENT: normal Neck: no JVD or masses Cardiac: RRR; no murmurs, rubs, or gallops,no edema  Respiratory:  clear to auscultation bilaterally, normal work of breathing GI: soft, nontender, nondistended, + BS MS: no deformity or atrophy Skin: warm and dry, no rash.  Groin sites clear without hematoma or ecchymosis. Small amount of blood under bandage on right groin.  Neuro:  Alert and Oriented x 3, Strength and sensation are intact Psych: euthymic mood, full affect   Labs & Radiologic Studies    CBC Recent Labs  12/13/19 1035 12/14/19 0150  WBC  --  14.6*  HGB 11.2* 10.5*  HCT 33.0* 30.1*  MCV  --  91.8  PLT  --  425   Basic Metabolic Panel Recent Labs    12/13/19 1035 12/14/19 0150  NA 140 137  K 5.0 4.0  CL 107 108  CO2  --  21*  GLUCOSE 256* 250*  BUN 39* 29*  CREATININE 1.40* 1.49*  CALCIUM  --  8.3*  MG  --  2.0   Liver Function Tests No results for input(s): AST, ALT, ALKPHOS, BILITOT, PROT, ALBUMIN in the last 72 hours. No results for input(s): LIPASE, AMYLASE in the last 72 hours. Cardiac Enzymes No results for input(s): CKTOTAL, CKMB, CKMBINDEX, TROPONINI in the last 72 hours. BNP Invalid input(s): POCBNP D-Dimer No results for input(s): DDIMER in the last 72 hours. Hemoglobin A1C No results for input(s): HGBA1C in the last 72 hours. Fasting Lipid Panel No results for input(s): CHOL, HDL, LDLCALC, TRIG, CHOLHDL, LDLDIRECT in the last 72 hours. Thyroid Function Tests No results for input(s): TSH, T4TOTAL, T3FREE, THYROIDAB in the last 72 hours.  Invalid input(s): FREET3 _____________  DG Chest 2 View  Result Date: 12/09/2019 CLINICAL DATA:  Preadmit for TAVR. EXAM: CHEST - 2 VIEW COMPARISON:  12/17/2015 chest radiograph and prior. FINDINGS: No focal consolidation, pneumothorax or pleural effusion. Cardiomediastinal silhouette within normal limits. Stable coronary stent. Multilevel  spondylosis. IMPRESSION: No acute airspace disease. Electronically Signed   By: Primitivo Gauze M.D.   On: 12/09/2019 14:57   CARDIAC CATHETERIZATION  Addendum Date: 11/15/2019    2nd Diag lesion is 95% stenosed.  Ost LAD to Mid LAD lesion is 40% stenosed.  Mid RCA to Dist RCA lesion is 65% stenosed.  Mid LAD lesion is 25% stenosed.  Previously placed 1st Mrg stent (unknown type) is widely patent.  Previously placed Ost Cx to Prox Cx stent (unknown type) is widely patent.  1. Moderate stenosis in the mid to distal RCA. Angiographically this is an eccentric lesion. DFR of the lesion in the mid to distal RCA was 0.97 suggesting the lesion was not flow limiting. 2. Patent mid Circumflex stents into the OM 3. Patent mid LAD stent. Jailed small Diagonal by the mid LAD stent. 4. Severe aortic stenosis ( mean gradient 44.6 mmHg, peak to peak gradient 49 mmHg, AVA 0.8 cm2). Recommendations: Medical management of CAD. Continue workup for TAVR.  He will be referred to see Dr. Cyndia Bent on 11/30/19.    Result Date: 11/15/2019  2nd Diag lesion is 95% stenosed.  Ost LAD to Mid LAD lesion is 40% stenosed.  Mid RCA to Dist RCA lesion is 65% stenosed.  Mid LAD lesion is 25% stenosed.  Previously placed 1st Mrg stent (unknown type) is widely patent.  Previously placed Ost Cx to Prox Cx stent (unknown type) is widely patent.  1. Moderate stenosis in the mid to distal RCA. Angiographically this is an eccentric lesion. DFR of the lesion in the mid to distal RCA was 0.97 suggesting the lesion was not flow limiting. 2. Patent mid Circumflex stents into the OM 3. Patent mid LAD stent. Jailed small Diagonal by the mid LAD stent. 4. Severe aortic stenosis ( mean gradient 44.6 mmHg, peak to peak gradient 49 mmHg, AVA 0.8 cm2). Recommendations: Medical management of CAD. Continue workup for TAVR. We will arrange a gated cardiac CT, CTA of the chest/abdomen and pelvis and carotid artery dopplers. He will then be referred to  see one  of the CT surgeons on our structural heart team.   VAS US CAROTID  Result Date: 11/30/2019 Carotid Arterial Duplex Study Indications:       Severe aortic stenosis - preop for TAVR. Risk Factors:      Diabetes, past history of smoking, coronary artery disease. Comparison Study:  No prior. Performing Technologist: Oda Cogan RDMS, RVT  Examination Guidelines: A complete evaluation includes B-mode imaging, spectral Doppler, color Doppler, and power Doppler as needed of all accessible portions of each vessel. Bilateral testing is considered an integral part of a complete examination. Limited examinations for reoccurring indications may be performed as noted.  Right Carotid Findings: +----------+--------+--------+--------+------------------+--------+             PSV cm/s EDV cm/s Stenosis Plaque Description Comments  +----------+--------+--------+--------+------------------+--------+  CCA Prox   146      11                                             +----------+--------+--------+--------+------------------+--------+  CCA Distal 81       17                                             +----------+--------+--------+--------+------------------+--------+  ICA Prox   121      25       1-39%    calcific                     +----------+--------+--------+--------+------------------+--------+  ICA Distal 56       16                                             +----------+--------+--------+--------+------------------+--------+  ECA        103      12                                             +----------+--------+--------+--------+------------------+--------+ +----------+--------+-------+----------------+-------------------+             PSV cm/s EDV cms Describe         Arm Pressure (mmHG)  +----------+--------+-------+----------------+-------------------+  Subclavian 138              Multiphasic, WNL                      +----------+--------+-------+----------------+-------------------+  +---------+--------+--------+---------+  Vertebral PSV cm/s EDV cm/s Antegrade  +---------+--------+--------+---------+  Left Carotid Findings: +----------+--------+--------+--------+------------------+------------------+             PSV cm/s EDV cm/s Stenosis Plaque Description Comments            +----------+--------+--------+--------+------------------+------------------+  CCA Prox   133      23                                                       +----------+--------+--------+--------+------------------+------------------+  CCA Distal 114  17                                   intimal thickening  +----------+--------+--------+--------+------------------+------------------+  ICA Prox   81       16       1-39%    calcific                               +----------+--------+--------+--------+------------------+------------------+  ICA Distal 81       20                                                       +----------+--------+--------+--------+------------------+------------------+  ECA        130      13                                                       +----------+--------+--------+--------+------------------+------------------+ +----------+--------+--------+----------------+-------------------+             PSV cm/s EDV cm/s Describe         Arm Pressure (mmHG)  +----------+--------+--------+----------------+-------------------+  Subclavian 152               Multiphasic, WNL                      +----------+--------+--------+----------------+-------------------+ +---------+--------+--+--------+--+---------+  Vertebral PSV cm/s 46 EDV cm/s 15 Antegrade  +---------+--------+--+--------+--+---------+   Summary: Right Carotid: Velocities in the right ICA are consistent with a 1-39% stenosis. Left Carotid: Velocities in the left ICA are consistent with a 1-39% stenosis. Vertebrals:  Bilateral vertebral arteries demonstrate antegrade flow. Subclavians: Normal flow hemodynamics were seen in bilateral subclavian               arteries. *See table(s) above for measurements and observations.  Electronically signed by Monica Martinez MD on 11/30/2019 at 2:59:08 PM.    Final    ECHOCARDIOGRAM LIMITED  Result Date: 12/13/2019    ECHOCARDIOGRAM LIMITED REPORT   Patient Name:   Barry Zavala Date of Exam: 12/13/2019 Medical Rec #:  [id number]        Height:       71.0 in Accession #:    [id number]       Weight:       188.6 lb Date of Birth:  [birth year]        BSA:          2.057 m Patient Age:    41 years         BP:           136/71 mmHg Patient Gender: M                HR:           74 bpm. Exam Location:  Inpatient Procedure: Limited Echo, Color Doppler and Cardiac Doppler Indications:     Aortic stenosis 424.1 / I35.0  History:         Patient has prior history of Echocardiogram examinations, most  recent 08/02/2019. CAD, Aortic Valve Disease; Risk                  Factors:Dyslipidemia, Diabetes and Former Smoker. Chronic                  kidney disease.                   Aortic Valve: 23 mm Edwards Sapien prosthetic, stented (TAVR)                  valve is present in the aortic position. Procedure Date:                  12/13/2019.  Sonographer:     Darlina Sicilian RDCS Referring Phys:  [place] FINDINGS:                  Normal left ventricular systolic function, estimated EF 60%.                  Normal regional wall motion.                  Severe calcific aortic stenosis, suspect functionally bicuspid                  valve.                  Peak gradient 60 mm Hg, mean gradient 33 mm Hg, dimensionless                  index 0.30, calculated aortic valve area by continuity equation                  0.84 cm.                  There is no aortic insufficiency.                  There is no mitral insufficiency.                  There is no pericardial effusion.                   POSTOPERATIVE FINDINGS:                  Normal left ventricular systolic  function, estimated EF 65%.                  Normal regional wall motion.                  Well seated aortic stent-valve prosthesis (TAVR).                  Peak gradient 11 mm Hg, mean gradient 6 mm Hg, dimensionless                  index 1.0, calculated aortic valve area by continuity equation                  2.9 cm.                  After initial valve deployment, there is a mild perivalvular                  leak.  After balloon re-expansion of the valve, there is no                  significant aortic insufficiency/perivalvular leak.                  There is no mitral insufficiency.                  There is no pericardial effusion. Diagnosing Phys: Sanda Klein MD IMPRESSIONS  1. Left ventricular ejection fraction, by estimation, is 60 to 65%. The left ventricle has normal function. The left ventricle has no regional wall motion abnormalities. Left ventricular diastolic function could not be evaluated.  2. Right ventricular systolic function is normal. The right ventricular size is normal.  3. Left atrial size was mildly dilated.  4. The mitral valve is normal in structure. No evidence of mitral valve regurgitation. No evidence of mitral stenosis.  5. Tricuspid valve regurgitation is mild to moderate.  6. 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 There is moderate calcification of the aortic valve. There is severe thickening of the aortic valve. Severe aortic valve stenosis. There is a 23 mm Edwards Sapien prosthetic (TAVR) valve was deployed in the aortic position. Procedure Date: 12/13/2019.  7. There is mild dilatation of the aortic root. FINDINGS  Left Ventricle: Left ventricular ejection fraction, by estimation, is 60 to 65%. The left ventricle has normal function. The left ventricle has no regional wall motion abnormalities. The left ventricular internal cavity size was normal in size. Left ventricular diastolic function could not be evaluated. Right Ventricle: The right  ventricular size is normal. No increase in right ventricular wall thickness. Right ventricular systolic function is normal. Left Atrium: Left atrial size was mildly dilated. Right Atrium: Right atrial size was normal in size. Pericardium: There is no evidence of pericardial effusion. Mitral Valve: The mitral valve is normal in structure. Mild mitral annular calcification. No evidence of mitral valve stenosis. Tricuspid Valve: The tricuspid valve is normal in structure. Tricuspid valve regurgitation is mild to moderate. Aortic Valve: The native aortic valve is bicuspid. There is moderate calcification of the aortic valve. There is severe thickening of the aortic valve. Severe aortic stenosis is present. A 23 mm Edwards Sapien prosthetic, stented (TAVR) valve was deployed in the aortic position. Procedure Date: 12/13/2019. TAVR valve mean gradient measures 5.5 mmHg, peak gradient measures 11.0 mmHg. TAVR valve area, by VTI measures 2.90 cm. Pulmonic Valve: The pulmonic valve was not well visualized. Pulmonic valve regurgitation is mild. Aorta: There is mild dilatation of the aortic root. IAS/Shunts: The interatrial septum was not assessed. LEFT VENTRICLE PLAX 2D LVOT diam:     1.90 cm LV SV:         113 LV SV Index:   55 LVOT Area:     2.84 cm  AORTIC VALVE AV Area (Vmax):    2.70 cm AV Area (Vmean):   2.66 cm AV Area (VTI):     2.90 cm AV Vmax:           166.00 cm/s AV Vmean:          104.900 cm/s AV VTI:            0.388 m AV Peak Grad:      11.0 mmHg AV Mean Grad:      5.5 mmHg LVOT Vmax:         158.00 cm/s LVOT Vmean:        98.500 cm/s LVOT  VTI:          0.397 m LVOT/AV VTI ratio: 1.02  SHUNTS Systemic VTI:  0.40 m Systemic Diam: 1.90 cm Dani Gobble Croitoru MD Electronically signed by Sanda Klein MD Signature Date/Time: 12/13/2019/10:09:09 AM    Final (Updated)    Structural Heart Procedure  Result Date: 12/13/2019 See surgical note for result.  Disposition   Pt is being discharged home today in good  condition.  Follow-up Plans & Appointments     Follow-up Information    Eileen Stanford, PA-C. Go on 12/22/2019.   Specialties: Cardiology, Radiology Why: @ 2:30pm. please arrive at least 10 minutes early. Contact information: [address] [place] [address] [phone]                Discharge Medications   Allergies as of 12/14/2019   No Active Allergies     Medication List    STOP taking these medications   omeprazole 20 MG capsule Commonly known as: PRILOSEC Replaced by: pantoprazole 40 MG tablet     TAKE these medications   Actos 45 MG tablet Generic drug: pioglitazone Take 45 mg by mouth daily with supper.   aspirin EC 81 MG tablet Take 1 tablet (81 mg total) by mouth daily.   clopidogrel 75 MG tablet Commonly known as: PLAVIX Take 1 tablet (75 mg total) by mouth daily with breakfast. Start taking on: December 15, 2019   GLUCOSAMINE CHONDR COMPLEX PO Take 1 capsule by mouth 2 (two) times daily.   glyBURIDE-metformin 5-500 MG tablet Commonly known as: GLUCOVANCE Take 2 tablets by mouth 2 (two) times daily with a meal. Notes to patient: Okay to resume after 48 hours after contrast dye exposure (12/15/19 PM)    Jardiance 10 MG Tabs tablet Generic drug: empagliflozin Take 10 mg by mouth daily.   metoprolol succinate 50 MG 24 hr tablet Commonly known as: TOPROL-XL Take 50 mg by mouth at bedtime. Take with or immediately following a meal.   multivitamin with minerals Tabs tablet Take 1 tablet by mouth daily. Centrum silver   omega-3 acid ethyl esters 1 g capsule Commonly known as: LOVAZA Take 1 g by mouth in the morning, at noon, and at bedtime.   Onglyza 5 MG Tabs tablet Generic drug: saxagliptin HCl Take 5 mg by mouth daily with supper.   pantoprazole 40 MG tablet Commonly known as: PROTONIX Take 1 tablet (40 mg total) by mouth daily. Start taking on: December 15, 2019 Replaces: omeprazole 20 MG capsule    ramipril 10 MG capsule Commonly known as: ALTACE Take 10 mg by mouth daily with supper.   Vitamin D3 Super Strength 50 MCG (2000 UT) Tabs Generic drug: Cholecalciferol Take 2,000 Units by mouth daily.   Vytorin 10-20 MG tablet Generic drug: ezetimibe-simvastatin TAKE 1 TABLET BY MOUTH DAILY. What changed: when to take this           Outstanding Labs/Studies   none  Duration of Discharge Encounter   Greater than 30 minutes including physician time.  Signed, Angelena Form, PA-C 12/14/2019, 9:20 AM [phone]  I have personally seen and examined this patient. I agree with the assessment and plan as outlined above.  He is doing well this am post TAVR. Groins stable. BP stable. No heart block noted on telemetry. Echo pending.  If Echo ok, will d/c home today with a Zio patch monitor for two weeks given baseline RBBB.  Continue ASA and Plavix.   Lauree Chandler 12/14/2019 10:11  AM

## 2019-12-14 NOTE — Discharge Instructions (Signed)
Prilosec was changed to Protonix given potential drug drug interaction with Plavix. You can resume Prilosec when you complete your 6 months of Plavix.   ACTIVITY AND EXERCISE . Daily activity and exercise are an important part of your recovery. People recover at different rates depending on their general health and type of valve procedure. . Most people recovering from TAVR feel better relatively quickly  . No lifting, pushing, pulling more than 10 pounds (examples to avoid: groceries, vacuuming, gardening, golfing):             - For one week with a procedure through the groin.             - For six weeks for procedures through the chest wall or neck. NOTE: You will typically see one of our providers 7-14 days after your procedure to discuss [place] the above activities.      DRIVING . Do not drive until you are seen for follow up and cleared by a provider. Generally, we ask patient to not drive for 1 week after their procedure. . If you have been told by your doctor in the past that you may not drive, you must talk with him/her before you begin driving again.   DRESSING . Groin site: you may leave the clear dressing over the site for up to one week or until it falls off.   HYGIENE . If you had a femoral (leg) procedure, you may take a shower when you return home. After the shower, pat the site dry. Do NOT use powder, oils or lotions in your groin area until the site has completely healed. . If you had a chest procedure, you may shower when you return home unless specifically instructed not to by your discharging practitioner.             - DO NOT scrub incision; pat dry with a towel.             - DO NOT apply any lotions, oils, powders to the incision.             - No tub baths / swimming for at least 2 weeks. . If you notice any fevers, chills, increased pain, swelling, bleeding or pus, please contact your doctor.   ADDITIONAL INFORMATION . If you are going to have an upcoming  dental procedure, please contact our office as you will require antibiotics ahead of time to prevent infection on your heart valve.    If you have any questions or concerns you can call the structural heart phone during normal business hours 8am-4pm. If you have an urgent need after hours or weekends please call [phone] to talk to the on call provider for general cardiology. If you have an emergency that requires immediate attention, please call 911.    After TAVR Checklist  Check  Test Description   Follow up appointment in 1-2 weeks  You will see our structural heart physician assistant, Nell Range. Your incision sites will be checked and you will be cleared to drive and resume all normal activities if you are doing well.     1 month echo and follow up  You will have an echo to check on your new heart valve and be seen back in the office by Nell Range. Many times the echo is not read by your appointment time, but Joellen Jersey will call you later that day or the following day to report your results.   Follow up with  your primary cardiologist You will need to be seen by your primary cardiologist in the following 3-6 months after your 1 month appointment in the valve clinic. Often times your Plavix or Aspirin will be discontinued during this time, but this is decided on a case by case basis.    1 year echo and follow up You will have another echo to check on your heart valve after 1 year and be seen back in the office by Nell Range. This your last structural heart visit.   Bacterial endocarditis prophylaxis  You will have to take antibiotics for the rest of your life before all dental procedures (even teeth cleanings) to protect your heart valve. Antibiotics are also required before some surgeries. Please check with your cardiologist before scheduling any surgeries. Also, please make sure to tell us if you have a penicillin allergy as you will require an alternative antibiotic.

## 2019-12-14 NOTE — Progress Notes (Signed)
Mobility Specialist: Progress Note   12/14/19 1230  Mobility  Activity Ambulated in hall  Level of Assistance Independent  Assistive Device None  Distance Ambulated (ft) 270 ft  Mobility Response Tolerated well  Mobility performed by Mobility specialist  $Mobility charge 1 Mobility   Pre-Mobility: 86 HR Post-Mobility: 85 HR  Pt was asx during ambulation and is very eager to discharge.   [facility] Derya Dettmann Mobility Specialist

## 2019-12-14 NOTE — Progress Notes (Signed)
Zio patch placed onto patient.  All instructions and information reviewed with patient, they verbalize understanding with no questions. 

## 2019-12-14 NOTE — Progress Notes (Signed)
CARDIAC REHAB PHASE I   Offered to walk with pt. Pt states ambulation this morning without difficulty, denies pain, SOB, or dizziness. Pt anxiously awaiting echo. Reviewed site care, restrictions, and exercise guidelines. Pt declining CRP II at this time. Hopeful for d/c after echo.  [id number] Rufina Falco, RN BSN 12/14/2019 9:31 AM

## 2019-12-15 ENCOUNTER — Telehealth: Payer: Self-pay | Admitting: Physician Assistant

## 2019-12-15 DIAGNOSIS — Z952 Presence of prosthetic heart valve: Secondary | ICD-10-CM | POA: Diagnosis not present

## 2019-12-15 MED FILL — Heparin Sodium (Porcine) Inj 1000 Unit/ML: INTRAMUSCULAR | Qty: 30 | Status: AC

## 2019-12-15 MED FILL — Potassium Chloride Inj 2 mEq/ML: INTRAVENOUS | Qty: 40 | Status: AC

## 2019-12-15 MED FILL — Magnesium Sulfate Inj 50%: INTRAMUSCULAR | Qty: 10 | Status: AC

## 2019-12-15 NOTE — Telephone Encounter (Signed)
  [place] VALVE TEAM   Patient contacted regarding discharge from [facility] on 12/8  Patient understands to follow up with provider Nell Range on 12/16 at [facility].  Patient understands discharge instructions? yes Patient understands medications and regimen? yes Patient understands to bring all medications to this visit? yes  Has a little oozing on left groin. Soft and not enlarging.   Angelena Form PA-C  [facility]

## 2019-12-15 NOTE — Progress Notes (Signed)
Pt given discharge instructions, medication lists, follow up appointments, and when to call the doctor.Pt verbalizes understanding. Pt transported to main entrance via wheelchair Kalman Shan, Baird Kay, RN

## 2019-12-16 LAB — BPAM RBC
Blood Product Expiration Date: 202201032359
Blood Product Expiration Date: 202201052359
Unit Type and Rh: 6200
Unit Type and Rh: 6200

## 2019-12-16 LAB — ECHOCARDIOGRAM LIMITED
AR max vel: 2.7 cm2
AR max vel: 1.47 cm2
AV Area VTI: 2.9 cm2
AV Area VTI: 1.41 cm2
AV Area mean vel: 2.66 cm2
AV Area mean vel: 1.35 cm2
AV Mean grad: 5.5 mmHg
AV Mean grad: 17 mmHg
AV Peak grad: 11 mmHg
AV Peak grad: 30.5 mmHg
Ao pk vel: 1.66 m/s
Ao pk vel: 2.76 m/s
Area-P 1/2: 2.95 cm2
Height: 71 in
Weight: 2987.67 oz

## 2019-12-16 LAB — TYPE AND SCREEN
ABO/RH(D): A POS
Antibody Screen: NEGATIVE
Unit division: 0
Unit division: 0

## 2019-12-20 NOTE — Progress Notes (Addendum)
[facility] [place]                                       Cardiology Office Note    Date:  12/22/2019   ID:  Barry Zavala, DOB 03/03/1940, MRN [id number]  PCP:  Josetta Huddle, MD  Cardiologist: Sinclair Grooms, MD / Dr. Angelena Form & Dr. Cyndia Bent (TAVR)  CC: [facility] s/p TAVR  History of Present Illness:  Barry Zavala is a 79 y.o. 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   Pt has a history coronary artery disease status post MI in 1995 and PCI in 2001. He had a cardiac cath in January 2021 showing a patent LAD stent with moderate LAD stenosis as well as a patent left circumflex stent. There was moderate to severe distal RCA stenosis. An echocardiogram at that time showed severe aortic stenosis with calcified and thickened leaflets and limited mobility. The mean gradient was 43 mmHg with a peak of 71.2 mmHg. Aortic valve area was 0.8 cm with a dimensionless index of 0.28. He was seen by Dr. Angelena Form at that time and was asymptomatic and decision was made to continue close follow-up. He had a follow-up echocardiogram in July 2021 which showed a mean gradient across aortic valve of 36 mmHg with a valve area of 0.75 cm. He was seen again in August 2021 and remained asymptomatic. He followed up with Dr. Daneen Schick in October and reported onset of exertionalsubsternal burning with walking up hills associated with mild shortness of breath. Therefore he underwent further work-up for consideration of TAVR. He was seen back by Dr. Angelena Form and underwent cardiac catheterization on 11/15/2019 which showed a moderate stenosis of 65% in the mid to distal RCA. The DFR of this lesion in the mid to distal RCA was 0.97 suggesting that it was not flow-limiting. There was patent mid left circumflex and obtuse marginal stents. There was a patent  stent in the mid LAD. There was severe aortic stenosis with a mean gradient of 44.6 mmHg and a peak to peak gradient of 49 mmHg. Aortic valve area was 0.8 cm.  He was evaluated by the multidisciplinary valve team and underwent successful TAVR with a 23 mm Edwards Sapien 3 Ultra THV via the TF approach on 12/13/19. Post operative echo showed EF 60%, normally functioning TAVR with a mean gradient of 17 mmHg and trivial PVL. He was discharged on aspirin $RemoveBe'81mg'ZETqtHHCu$  daily and plavix 75 mg daily was added. Given underlying RBBB and TAVR, he was discharged home with a Zio AT.   Today he presents to clinic for follow up. No CP or SOB. No LE edema, orthopnea or PND. No dizziness or syncope. No blood in stool or urine. No palpitations. Chest pain has completely resolved since TAVR. Anxious to get back to golfing and driving.     Past Medical History:  Diagnosis Date  . Anemia   . Arthritis    hands  . Chronic kidney disease (CKD), stage III (moderate) (HCC)   . Coronary artery disease   . Elevated PSA   . Erectile dysfunction   . GERD (gastroesophageal reflux disease)   . Hyperlipidemia   . Hypertension   . Irritable bowel syndrome   .  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 Myocardial infarction ([place]) 1995  . Obesity   . Onychomycosis   . Palmar fibromatosis   . Right bundle branch block   . 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 Angelena Form and Dr. Laverta Baltimore   . Severe aortic stenosis   . 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 Type 2 diabetes mellitus ([place])     Past Surgical History:  Procedure Laterality Date  . APPENDECTOMY    . CARDIAC CATHETERIZATION  2021  . cardiac stents    . CARPAL TUNNEL RELEASE Left 12/09/2016   Procedure: CARPAL TUNNEL RELEASE;  Surgeon: Daryll Brod, MD;  Location: [place];  Service: Orthopedics;  Laterality: Left;  . CARPAL TUNNEL WITH CUBITAL TUNNEL Right 01/13/2017   Procedure: RIGHT CARPAL TUNNEL WITH CUBITAL TUNNEL RELEASE;  Surgeon: Daryll Brod, MD;  Location: [place];  Service: Orthopedics;  Laterality: Right;  . EYE SURGERY Bilateral 2019   Dr. Gershon Crane  . INTRAVASCULAR PRESSURE WIRE/FFR STUDY N/A 11/15/2019   Procedure: INTRAVASCULAR PRESSURE WIRE/FFR STUDY;  Surgeon: Burnell Blanks, MD;  Location: [place] [facility];  Service: Cardiovascular;  Laterality: N/A;  . RIGHT/LEFT HEART CATH AND CORONARY ANGIOGRAPHY N/A 02/02/2019   Procedure: RIGHT/LEFT HEART CATH AND CORONARY ANGIOGRAPHY;  Surgeon: Belva Crome, MD;  Location: [place] [facility];  Service: Cardiovascular;  Laterality: N/A;  . RIGHT/LEFT HEART CATH AND CORONARY ANGIOGRAPHY N/A 11/15/2019   Procedure: RIGHT/LEFT HEART CATH AND CORONARY ANGIOGRAPHY;  Surgeon: Burnell Blanks, MD;  Location: [place] [facility];  Service: Cardiovascular;  Laterality: N/A;  . TEE WITHOUT CARDIOVERSION N/A 12/13/2019   Procedure: TRANSESOPHAGEAL ECHOCARDIOGRAM (TEE);  Surgeon: Burnell Blanks, MD;  Location: [place];  Service: Open Heart Surgery;  Laterality: N/A;  . TRANSCATHETER AORTIC VALVE REPLACEMENT, TRANSFEMORAL  12/13/2019  . TRANSCATHETER AORTIC VALVE REPLACEMENT, TRANSFEMORAL N/A 12/13/2019   Procedure: TRANSCATHETER AORTIC VALVE REPLACEMENT, TRANSFEMORAL;  Surgeon: Burnell Blanks, MD;  Location: [place];  Service: Open Heart Surgery;  Laterality: N/A;  . TRIGGER FINGER RELEASE Left 08/10/2018   Procedure: RELEASE TRIGGER FINGER/A-1 PULLEY THUMB AND LEFT SMALL;  Surgeon: Daryll Brod, MD;  Location: [place];  Service: Orthopedics;  Laterality: Left;  FAB  . ULNAR NERVE TRANSPOSITION Left 12/09/2016   Procedure: DECOMPRESSION LEFT ULNAR NERVE;  Surgeon: Daryll Brod, MD;  Location: [place];  Service: Orthopedics;   Laterality: Left;  . ULNAR NERVE TRANSPOSITION Right 01/13/2017   Procedure: DECOMPRESSION ULNAR NERVE;  Surgeon: Daryll Brod, MD;  Location: [place];  Service: Orthopedics;  Laterality: Right;    Current Medications: Outpatient Medications Prior to Visit  Medication Sig Dispense Refill  . ACTOS 45 MG tablet Take 45 mg by mouth daily with supper.   1  . aspirin EC 81 MG tablet Take 1 tablet (81 mg total) by mouth daily. 90 tablet 3  . Cholecalciferol (VITAMIN D3 SUPER STRENGTH) 50 MCG (2000 UT) TABS Take 2,000 Units by mouth daily.    . clopidogrel (PLAVIX) 75 MG tablet Take 1 tablet (75 mg total) by mouth daily with breakfast. 60 tablet 1  . empagliflozin (JARDIANCE) 10 MG TABS tablet Take 10 mg by mouth daily.     . Glucosamine-Chondroitin (GLUCOSAMINE CHONDR COMPLEX PO)  Take 1 capsule by mouth 2 (two) times daily.    Marland Kitchen glyBURIDE-metformin (GLUCOVANCE) 5-500 MG per tablet Take 2 tablets by mouth 2 (two) times daily with a meal.    . metoprolol succinate (TOPROL-XL) 50 MG 24 hr tablet Take 50 mg by mouth at bedtime. Take with or immediately following a meal.    . Multiple Vitamin (MULTIVITAMIN WITH MINERALS) TABS tablet Take 1 tablet by mouth daily. Centrum silver    . omega-3 acid ethyl esters (LOVAZA) 1 G capsule Take 1 g by mouth in the morning, at noon, and at bedtime.     . ONGLYZA 5 MG TABS tablet Take 5 mg by mouth daily with supper.   2  . pantoprazole (PROTONIX) 40 MG tablet Take 1 tablet (40 mg total) by mouth daily. 60 tablet 1  . ramipril (ALTACE) 10 MG capsule Take 10 mg by mouth daily with supper.     Marland Kitchen VYTORIN 10-20 MG tablet TAKE 1 TABLET BY MOUTH DAILY. (Patient taking differently: Take 1 tablet by mouth daily with supper.) 90 tablet 3   No facility-administered medications prior to visit.     Allergies:   Patient has no active allergies.   Social History   Socioeconomic History  . Marital status: Married    Spouse name: Not on file  . Number of  children: 2  . Years of education: Not on file  . Highest education level: Not on file  Occupational History  . Occupation: Retired Chief Executive Officer  Tobacco Use  . Smoking status: Former Smoker    Packs/day: 1.00    Years: 20.00    Pack years: 20.00    Types: Cigarettes    Quit date: 1988    Years since quitting: 33.9  . Smokeless tobacco: Never Used  Vaping Use  . Vaping Use: Never used  Substance and Sexual Activity  . Alcohol use: Yes    Alcohol/week: 0.0 standard drinks    Comment: social  . Drug use: No  . Sexual activity: Yes  Other Topics Concern  . Not on file  Social History Narrative  . Not on file   Social Determinants of Health   Financial Resource Strain: Not on file  Food Insecurity: Not on file  Transportation Needs: Not on file  Physical Activity: Not on file  Stress: Not on file  Social Connections: Not on file     Family History:  The patient's family history includes Cancer in his paternal uncle; Heart failure in his father; Multiple myeloma (age of onset: 62) in his sister; Ovarian cancer (age of onset: 42) in his mother.     ROS:   Please see the history of present illness.    ROS All other systems reviewed and are negative.   PHYSICAL EXAM:   VS:  BP 128/60   Pulse 73   Ht $R'5\' 11"'Xg$  (1.803 m)   Wt 191 lb 12.8 oz (87 kg)   SpO2 98%   BMI 26.75 kg/m    GEN: Well nourished, well developed, in no acute distress HEENT: normal Neck: no JVD or masses Cardiac: RRR; soft flow murmur. No rubs, or gallops,no edema  Respiratory:  clear to auscultation bilaterally, normal work of breathing GI: soft, nontender, nondistended, + BS MS: no deformity or atrophy Skin: warm and dry, no rash Neuro:  Alert and Oriented x 3, Strength and sensation are intact Psych: euthymic mood, full affect   Wt Readings from Last 3 Encounters:  12/22/19 191 lb 12.8 oz (  87 kg)  12/14/19 186 lb 11.7 oz (84.7 kg)  12/09/19 188 lb 9.6 oz (85.5 kg)      Studies/Labs Reviewed:    EKG:  EKG is ordered today.  The ekg ordered today demonstrates sinus with RBBB, HR 73 bpm  Recent Labs: 12/09/2019: ALT 19; B Natriuretic Peptide 81.8 12/14/2019: BUN 29; Creatinine, Ser 1.49; Hemoglobin 10.5; Magnesium 2.0; Platelets 170; Potassium 4.0; Sodium 137   Lipid Panel    Component Value Date/Time   CHOL 83 (L) 01/24/2015 0949   TRIG 49 01/24/2015 0949   HDL 38 (L) 01/24/2015 0949   CHOLHDL 2.2 01/24/2015 0949   VLDL 10 01/24/2015 0949   LDLCALC 35 01/24/2015 0949    Additional studies/ records that were reviewed today include:  TAVR OPERATIVE NOTE   Date of Procedure:12/13/2019  Preoperative Diagnosis:Severe Aortic Stenosis   Postoperative Diagnosis:Same   Procedure:   Transcatheter Aortic Valve Replacement - PercutaneousRightTransfemoral Approach Edwards Sapien 3 Ultra THV (size 31mm, model # 9750TFX, serial # [id number])  Co-Surgeons:Bryan Alveria Apley, MD and Lauree Chandler, MD   Anesthesiologist:Hodierne, MD  Echocardiographer:Croitoru, MD  Pre-operative Echo Findings: ? Severe aortic stenosis ? Normalleft ventricular systolic function  Post-operative Echo Findings: ? Noparavalvular leak ? Normalleft ventricular systolic function  _____________    Echo 12/14/19: IMPRESSIONS  1. Left ventricular ejection fraction, by estimation, is 60 to 65%. The  left ventricle has normal function. The left ventricle has no regional  wall motion abnormalities. Left ventricular diastolic parameters are  consistent with Grade II diastolic  dysfunction (pseudonormalization). Elevated left atrial pressure.  2. Right ventricular systolic function is normal. The right ventricular  size is normal. Tricuspid regurgitation signal is inadequate for assessing  PA pressure.  3. The mitral valve is normal in structure. No evidence of mitral  valve  regurgitation. No evidence of mitral stenosis.  4. Trivial perivalvular leak is seen, towards the mitral annulus. The  aortic valve has been repaired/replaced. Aortic valve regurgitation is  trivial. No aortic stenosis is present. There is a 23 mm Edwards Ultra  Sapien valve present in the aortic  position. Procedure Date: 12/13/2019. Aortic valve mean gradient measures  17.0 mmHg. Aortic valve Vmax measures 2.76 m/s.  5. The inferior vena cava is normal in size with greater than 50%  respiratory variability, suggesting right atrial pressure of 3 mmHg.   ASSESSMENT & PLAN:   Severe AS s/p TAVR:doing great. Anxious to get back to golfing and driving. Chest tightness has completely resolved since TAVR. Continue aspirin and plavix. SBE prophylaxis discussed; I have RX'd amoxicillin. Will see him back next month for follow up and echo.   CAD: pre TAVR cath showed moderate stenosis in the mid to distal RCA. Angiographically this is an eccentric lesion. DFR of the lesion in the mid to distal RCA was 0.97 suggesting the lesion was not flow limiting. Patent mid Circumflex stents into the OM. Patent mid LAD stent. Jailed small Diagonal by the mid LAD stent.  Plan for medical management of CAD  RBBB: stable by ECG today. No high risk alerts on patch.   Incidental findings: pre TAVR CT showed the following    Small pulmonary nodules scattered throughout the lungs bilaterally measuring 4 mm or less in size, nonspecific, but statistically benign. No follow-up needed if patient is low-risk (and has no known or suspected primary neoplasm). Non-contrast chest CT can be considered in 12 months if patient is high-risk. He was a former smoker so will have this set  up at 1 year.   4 mm hypervascular lesion in segment 2 of the liver, nonspecific but strongly favored to represent a benign lesions such as a small flash fill cavernous hemangioma. This could be definitively characterized with follow-up  nonemergent abdominal MRI with and without IV gadolinium if of clinical concern. Will defer to PCP on whether this needs follow up.   These will be discussed at his 1 month appointment.    Medication Adjustments/Labs and Tests Ordered: Current medicines are reviewed at length with the patient today.  Concerns regarding medicines are outlined above.  Medication changes, Labs and Tests ordered today are listed in the Patient Instructions below. Patient Instructions  Medication Instructions:  1) Your provider discussed the importance of taking an antibiotic prior to all dental visits to prevent damage to the heart valves from infection. You were given a prescription for AMOXIL 2,000 mg to take one hour prior to any dental appointment.  *If you need a refill on your cardiac medications before your next appointment, please call your pharmacy*  Follow-Up: Please keep your appointments as scheduled!    Signed, Angelena Form, PA-C  12/22/2019 2:53 PM    [facility] [place], [place], [place]  [address] Phone: [phone]; Fax: [phone]

## 2019-12-21 ENCOUNTER — Ambulatory Visit: Payer: Medicare Other | Admitting: Physician Assistant

## 2019-12-22 ENCOUNTER — Encounter: Payer: Self-pay | Admitting: Physician Assistant

## 2019-12-22 ENCOUNTER — Ambulatory Visit: Payer: Medicare Other | Admitting: Physician Assistant

## 2019-12-22 ENCOUNTER — Other Ambulatory Visit: Payer: Self-pay | Admitting: Physician Assistant

## 2019-12-22 ENCOUNTER — Other Ambulatory Visit: Payer: Self-pay

## 2019-12-22 VITALS — BP 128/60 | HR 73 | Ht 71.0 in | Wt 191.8 lb

## 2019-12-22 DIAGNOSIS — I25110 Atherosclerotic heart disease of native coronary artery with unstable angina pectoris: Secondary | ICD-10-CM

## 2019-12-22 DIAGNOSIS — R918 Other nonspecific abnormal finding of lung field: Secondary | ICD-10-CM

## 2019-12-22 DIAGNOSIS — I4510 Unspecified right bundle-branch block: Secondary | ICD-10-CM

## 2019-12-22 DIAGNOSIS — K769 Liver disease, unspecified: Secondary | ICD-10-CM

## 2019-12-22 DIAGNOSIS — Z952 Presence of prosthetic heart valve: Secondary | ICD-10-CM

## 2019-12-22 MED ORDER — AMOXICILLIN 500 MG PO TABS: ORAL_TABLET | ORAL | 11 refills | Status: DC

## 2019-12-22 NOTE — Patient Instructions (Signed)
Medication Instructions:  1) Your provider discussed the importance of taking an antibiotic prior to all dental visits to prevent damage to the heart valves from infection. You were given a prescription for AMOXIL 2,000 mg to take one hour prior to any dental appointment.  *If you need a refill on your cardiac medications before your next appointment, please call your pharmacy*   Follow-Up: Please keep your appointments as scheduled! 

## 2019-12-23 ENCOUNTER — Encounter: Payer: Self-pay | Admitting: Physician Assistant

## 2020-01-12 ENCOUNTER — Other Ambulatory Visit: Payer: Self-pay

## 2020-01-12 ENCOUNTER — Ambulatory Visit (INDEPENDENT_AMBULATORY_CARE_PROVIDER_SITE_OTHER): Payer: Medicare Other | Admitting: Physician Assistant

## 2020-01-12 ENCOUNTER — Ambulatory Visit (HOSPITAL_COMMUNITY): Payer: Medicare Other | Attending: Internal Medicine

## 2020-01-12 ENCOUNTER — Other Ambulatory Visit: Payer: Self-pay | Admitting: Physician Assistant

## 2020-01-12 ENCOUNTER — Encounter: Payer: Self-pay | Admitting: Physician Assistant

## 2020-01-12 VITALS — BP 112/54 | HR 84 | Ht 71.0 in | Wt 187.8 lb

## 2020-01-12 DIAGNOSIS — K769 Liver disease, unspecified: Secondary | ICD-10-CM | POA: Diagnosis not present

## 2020-01-12 DIAGNOSIS — R918 Other nonspecific abnormal finding of lung field: Secondary | ICD-10-CM | POA: Diagnosis not present

## 2020-01-12 DIAGNOSIS — Z952 Presence of prosthetic heart valve: Secondary | ICD-10-CM

## 2020-01-12 DIAGNOSIS — I350 Nonrheumatic aortic (valve) stenosis: Secondary | ICD-10-CM | POA: Diagnosis not present

## 2020-01-12 DIAGNOSIS — I25110 Atherosclerotic heart disease of native coronary artery with unstable angina pectoris: Secondary | ICD-10-CM

## 2020-01-12 DIAGNOSIS — I4510 Unspecified right bundle-branch block: Secondary | ICD-10-CM

## 2020-01-12 LAB — ECHOCARDIOGRAM COMPLETE
AR max vel: 1.63 cm2
AV Area VTI: 1.89 cm2
AV Area mean vel: 1.72 cm2
AV Mean grad: 16 mmHg
AV Peak grad: 32.5 mmHg
Ao pk vel: 2.85 m/s
Area-P 1/2: 2.36 cm2
S' Lateral: 1.8 cm

## 2020-01-12 MED ORDER — PANTOPRAZOLE SODIUM 40 MG PO TBEC: 40.0000 mg | DELAYED_RELEASE_TABLET | Freq: Every day | ORAL | 0 refills | Status: DC

## 2020-01-12 MED ORDER — CLOPIDOGREL BISULFATE 75 MG PO TABS: 75.0000 mg | ORAL_TABLET | Freq: Every day | ORAL | 0 refills | Status: DC

## 2020-01-12 NOTE — Patient Instructions (Signed)
Medication Instructions:  1) You may STOP PLAVIX 6 months out from your procedure (around 06/12/2020) *If you need a refill on your cardiac medications before your next appointment, please call your pharmacy*  Follow-Up: Keep your appointment as scheduled with Dr. Katrinka Blazing.  We will contact you to arrange your 1 year visits!

## 2020-01-12 NOTE — Progress Notes (Signed)
[facility] [place]                                       Cardiology Office Note    Date:  01/12/2020   ID:  Barry Zavala, DOB 07/01/1940, MRN [id number]  PCP:  Josetta Huddle, MD  Cardiologist: Sinclair Grooms, MD / Dr. Angelena Form & Dr. Cyndia Bent (TAVR)  CC: 1 month s/p TAVR  History of Present Illness:  Barry Zavala is a 80 y.o. 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   Pt has a history coronary artery disease status post MI in 1995 and PCI in 2001. He had a cardiac cath in January 2021 showing a patent LAD stent with moderate LAD stenosis as well as a patent left circumflex stent. There was moderate to severe distal RCA stenosis. An echocardiogram at that time showed severe aortic stenosis with calcified and thickened leaflets and limited mobility. The mean gradient was 43 mmHg with a peak of 71.2 mmHg. Aortic valve area was 0.8 cm with a dimensionless index of 0.28. He was seen by Dr. Angelena Form at that time and was asymptomatic and decision was made to continue close follow-up. He had a follow-up echocardiogram in July 2021 which showed a mean gradient across aortic valve of 36 mmHg with a valve area of 0.75 cm. He was seen again in August 2021 and remained asymptomatic. He followed up with Dr. Daneen Schick in October and reported onset of exertionalsubsternal burning with walking up hills associated with mild shortness of breath. Therefore, he underwent further work-up for consideration of TAVR. He was seen back by Dr. Angelena Form and underwent cardiac catheterization on 11/15/2019 which showed a moderate stenosis of 65% in the mid to distal RCA. The DFR of this lesion in the mid to distal RCA was 0.97 suggesting that it was not flow-limiting. There were patent mid left circumflex and obtuse marginal stents as well as a  patent stent in the mid LAD. There was severe aortic stenosis with a mean gradient of 44.6 mmHg and a peak to peak gradient of 49 mmHg. Aortic valve area was 0.8 cm.  He was evaluated by the multidisciplinary valve team and underwent successful TAVR with a 23 mm Edwards Sapien 3 Ultra THV via the TF approach on 12/13/19. Post operative echo showed EF 60%, normally functioning TAVR with a mean gradient of 17 mmHg and trivial PVL. He was discharged on aspirin $RemoveBe'81mg'UNHTYeHab$  daily and plavix 75 mg daily was added. Given underlying RBBB and TAVR, he was discharged home with a Zio AT. Final report still pending but no high risk alerts. He has done well in follow up with resolution of chest pain.   Today he presents to clinic for follow up. Doing excellent. Played golf today and didn't get any chest pain. Very pleased with how his surgery turned out. No CP or SOB. No LE edema, orthopnea or PND.   Past Medical History:  Diagnosis Date  . Anemia   . Arthritis    hands  . Chronic kidney disease (CKD), stage III (moderate) (HCC)   . Coronary artery disease   . Elevated PSA   . Erectile dysfunction   . GERD (gastroesophageal reflux disease)   . Hyperlipidemia   .  Hypertension   . Irritable bowel syndrome   . 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 Myocardial infarction (HCC) 1995  . Obesity   . Onychomycosis   . Palmar fibromatosis   . Right bundle branch block   . 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 Clifton James and Dr. Renato Battles   . Severe aortic stenosis   . 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 Type 2 diabetes mellitus (HCC)     Past Surgical History:  Procedure Laterality Date  . APPENDECTOMY    . CARDIAC CATHETERIZATION  2021  . cardiac stents    . CARPAL TUNNEL RELEASE Left 12/09/2016   Procedure: CARPAL  TUNNEL RELEASE;  Surgeon: Cindee Salt, MD;  Location: [facility];  Service: Orthopedics;  Laterality: Left;  . CARPAL TUNNEL WITH CUBITAL TUNNEL Right 01/13/2017   Procedure: RIGHT CARPAL TUNNEL WITH CUBITAL TUNNEL RELEASE;  Surgeon: Cindee Salt, MD;  Location: [facility];  Service: Orthopedics;  Laterality: Right;  . EYE SURGERY Bilateral 2019   Dr. Nile Riggs  . INTRAVASCULAR PRESSURE WIRE/FFR STUDY N/A 11/15/2019   Procedure: INTRAVASCULAR PRESSURE WIRE/FFR STUDY;  Surgeon: Kathleene Hazel, MD;  Location: [facility];  Service: Cardiovascular;  Laterality: N/A;  . RIGHT/LEFT HEART CATH AND CORONARY ANGIOGRAPHY N/A 02/02/2019   Procedure: RIGHT/LEFT HEART CATH AND CORONARY ANGIOGRAPHY;  Surgeon: Lyn Records, MD;  Location: [facility];  Service: Cardiovascular;  Laterality: N/A;  . RIGHT/LEFT HEART CATH AND CORONARY ANGIOGRAPHY N/A 11/15/2019   Procedure: RIGHT/LEFT HEART CATH AND CORONARY ANGIOGRAPHY;  Surgeon: Kathleene Hazel, MD;  Location: [facility];  Service: Cardiovascular;  Laterality: N/A;  . TEE WITHOUT CARDIOVERSION N/A 12/13/2019   Procedure: TRANSESOPHAGEAL ECHOCARDIOGRAM (TEE);  Surgeon: Kathleene Hazel, MD;  Location: [facility];  Service: Open Heart Surgery;  Laterality: N/A;  . TRANSCATHETER AORTIC VALVE REPLACEMENT, TRANSFEMORAL  12/13/2019  . TRANSCATHETER AORTIC VALVE REPLACEMENT, TRANSFEMORAL N/A 12/13/2019   Procedure: TRANSCATHETER AORTIC VALVE REPLACEMENT, TRANSFEMORAL;  Surgeon: Kathleene Hazel, MD;  Location: [facility];  Service: Open Heart Surgery;  Laterality: N/A;  . TRIGGER FINGER RELEASE Left 08/10/2018   Procedure: RELEASE TRIGGER FINGER/A-1 PULLEY THUMB AND LEFT SMALL;  Surgeon: Cindee Salt, MD;  Location: [facility];  Service: Orthopedics;  Laterality: Left;  FAB  . ULNAR NERVE TRANSPOSITION Left 12/09/2016   Procedure: DECOMPRESSION LEFT ULNAR NERVE;  Surgeon: Cindee Salt, MD;   Location: [facility];  Service: Orthopedics;  Laterality: Left;  . ULNAR NERVE TRANSPOSITION Right 01/13/2017   Procedure: DECOMPRESSION ULNAR NERVE;  Surgeon: Cindee Salt, MD;  Location: [facility];  Service: Orthopedics;  Laterality: Right;    Current Medications: Outpatient Medications Prior to Visit  Medication Sig Dispense Refill  . ACTOS 45 MG tablet Take 45 mg by mouth daily with supper.   1  . amoxicillin (AMOXIL) 500 MG tablet Take 4 tablets (2,000 mg total) one hour prior to all dental visits. 24 tablet 11  . aspirin EC 81 MG tablet Take 1 tablet (81 mg total) by mouth daily. 90 tablet 3  . Cholecalciferol (VITAMIN D3 SUPER STRENGTH) 50 MCG (2000 UT) TABS Take 2,000 Units by mouth daily.    . empagliflozin (JARDIANCE) 10 MG TABS tablet Take 10 mg by  mouth daily.     . Glucosamine-Chondroitin (GLUCOSAMINE CHONDR COMPLEX PO) Take 1 capsule by mouth 2 (two) times daily.    Marland Kitchen glyBURIDE-metformin (GLUCOVANCE) 5-500 MG per tablet Take 2 tablets by mouth 2 (two) times daily with a meal.    . metoprolol succinate (TOPROL-XL) 50 MG 24 hr tablet Take 50 mg by mouth at bedtime. Take with or immediately following a meal.    . Multiple Vitamin (MULTIVITAMIN WITH MINERALS) TABS tablet Take 1 tablet by mouth daily. Centrum silver    . omega-3 acid ethyl esters (LOVAZA) 1 G capsule Take 1 g by mouth in the morning, at noon, and at bedtime.     . ONGLYZA 5 MG TABS tablet Take 5 mg by mouth daily with supper.   2  . ramipril (ALTACE) 10 MG capsule Take 10 mg by mouth daily with supper.     Marland Kitchen VYTORIN 10-20 MG tablet TAKE 1 TABLET BY MOUTH DAILY. (Patient taking differently: Take 1 tablet by mouth daily with supper.) 90 tablet 3  . clopidogrel (PLAVIX) 75 MG tablet Take 1 tablet (75 mg total) by mouth daily with breakfast. 60 tablet 1  . pantoprazole (PROTONIX) 40 MG tablet Take 1 tablet (40 mg total) by mouth daily. 60 tablet 1   No facility-administered medications prior  to visit.     Allergies:   Patient has no active allergies.   Social History   Socioeconomic History  . Marital status: Married    Spouse name: Not on file  . Number of children: 2  . Years of education: Not on file  . Highest education level: Not on file  Occupational History  . Occupation: Retired Chief Executive Officer  Tobacco Use  . Smoking status: Former Smoker    Packs/day: 1.00    Years: 20.00    Pack years: 20.00    Types: Cigarettes    Quit date: 1988    Years since quitting: 34.0  . Smokeless tobacco: Never Used  Vaping Use  . Vaping Use: Never used  Substance and Sexual Activity  . Alcohol use: Yes    Alcohol/week: 0.0 standard drinks    Comment: social  . Drug use: No  . Sexual activity: Yes  Other Topics Concern  . Not on file  Social History Narrative  . Not on file   Social Determinants of Health   Financial Resource Strain: Not on file  Food Insecurity: Not on file  Transportation Needs: Not on file  Physical Activity: Not on file  Stress: Not on file  Social Connections: Not on file     Family History:  The patient's family history includes Cancer in his paternal uncle; Heart failure in his father; Multiple myeloma (age of onset: 41) in his sister; Ovarian cancer (age of onset: 9) in his mother.     ROS:   Please see the history of present illness.    ROS All other systems reviewed and are negative.   PHYSICAL EXAM:   VS:  BP (!) 112/54   Pulse 84   Ht $R'5\' 11"'in$  (1.803 m)   Wt 187 lb 12.8 oz (85.2 kg)   SpO2 95%   BMI 26.19 kg/m    GEN: Well nourished, well developed, in no acute distress HEENT: normal Neck: no JVD or masses Cardiac: RRR; soft flow murmur. No rubs, or gallops,no edema  Respiratory:  clear to auscultation bilaterally, normal work of breathing GI: soft, nontender, nondistended, + BS MS: no deformity or atrophy Skin: warm and  dry, no rash Neuro:  Alert and Oriented x 3, Strength and sensation are intact Psych: euthymic mood, full  affect   Wt Readings from Last 3 Encounters:  01/12/20 187 lb 12.8 oz (85.2 kg)  12/22/19 191 lb 12.8 oz (87 kg)  12/14/19 186 lb 11.7 oz (84.7 kg)      Studies/Labs Reviewed:   EKG:  EKG is NOT ordered today.    Recent Labs: 12/09/2019: ALT 19; B Natriuretic Peptide 81.8 12/14/2019: BUN 29; Creatinine, Ser 1.49; Hemoglobin 10.5; Magnesium 2.0; Platelets 170; Potassium 4.0; Sodium 137   Lipid Panel    Component Value Date/Time   CHOL 83 (L) 01/24/2015 0949   TRIG 49 01/24/2015 0949   HDL 38 (L) 01/24/2015 0949   CHOLHDL 2.2 01/24/2015 0949   VLDL 10 01/24/2015 0949   LDLCALC 35 01/24/2015 0949    Additional studies/ records that were reviewed today include:  TAVR OPERATIVE NOTE   Date of Procedure:12/13/2019  Preoperative Diagnosis:Severe Aortic Stenosis   Postoperative Diagnosis:Same   Procedure:   Transcatheter Aortic Valve Replacement - PercutaneousRightTransfemoral Approach Edwards Sapien 3 Ultra THV (size 44mm, model # 9750TFX, serial # [id number])  Co-Surgeons:Bryan Jennefer Bravo, MD and Verne Carrow, MD   Anesthesiologist:Hodierne, MD  Echocardiographer:Croitoru, MD  Pre-operative Echo Findings: ? Severe aortic stenosis ? Normalleft ventricular systolic function  Post-operative Echo Findings: ? Noparavalvular leak ? Normalleft ventricular systolic function  _____________    Echo 12/14/19: IMPRESSIONS  1. Left ventricular ejection fraction, by estimation, is 60 to 65%. The  left ventricle has normal function. The left ventricle has no regional  wall motion abnormalities. Left ventricular diastolic parameters are  consistent with Grade II diastolic  dysfunction (pseudonormalization). Elevated left atrial pressure.  2. Right ventricular systolic function is normal. The right ventricular  size is normal. Tricuspid  regurgitation signal is inadequate for assessing  PA pressure.  3. The mitral valve is normal in structure. No evidence of mitral valve  regurgitation. No evidence of mitral stenosis.  4. Trivial perivalvular leak is seen, towards the mitral annulus. The  aortic valve has been repaired/replaced. Aortic valve regurgitation is  trivial. No aortic stenosis is present. There is a 23 mm Edwards Ultra  Sapien valve present in the aortic  position. Procedure Date: 12/13/2019. Aortic valve mean gradient measures  17.0 mmHg. Aortic valve Vmax measures 2.76 m/s.  5. The inferior vena cava is normal in size with greater than 50%  respiratory variability, suggesting right atrial pressure of 3 mmHg.   ____________________  Echo 01/12/20 IMPRESSIONS  1. Left ventricular ejection fraction, by estimation, is 60 to 65%. The  left ventricle has normal function. The left ventricle has no regional  wall motion abnormalities. Left ventricular diastolic parameters are  consistent with Grade I diastolic  dysfunction (impaired relaxation).  2. Right ventricular systolic function is normal. The right ventricular  size is normal. There is normal pulmonary artery systolic pressure. The  estimated right ventricular systolic pressure is 24.5 mmHg.  3. The mitral valve is normal in structure. Trivial mitral valve  regurgitation. No evidence of mitral stenosis.  4. The aortic valve has been repaired/replaced. Aortic valve  regurgitation is not visualized. There is a 23 mm Sapien prosthetic (TAVR)  valve present in the aortic position. Procedure Date: 12/13/19. Echo  findings are consistent with normal structure and  function of the aortic valve prosthesis. Aortic valve area, by VTI  measures 1.89 cm. Aortic valve mean gradient measures 16.0 mmHg.  5. Aortic dilatation noted.  There is borderline dilatation of the aortic  root and of the ascending aorta, measuring 39 mm.  6. The inferior vena cava is normal  in size with greater than 50%  respiratory variability, suggesting right atrial pressure of 3 mmHg.    ASSESSMENT & PLAN:   Severe AS s/p TAVR: echo today shows EF 60-65%, normally functioning TAVR with a mean gradient of 16 mmHg and no PVL. He has NYHA class I symptoms. SBE prophylaxis discussed; he has amoxicillin. Continue on ASA and Plavix. Plavix can be discontinued after 6 months of therapy. I will see him back in 1 year with an echo .  CAD: pre TAVR cath showed moderate stenosis in the mid to distal RCA. Angiographically this is an eccentric lesion. DFR of the lesion in the mid to distal RCA was 0.97 suggesting the lesion was not flow limiting. Patent mid Circumflex stents into the OM. Patent mid LAD stent. Jailed small Diagonal by the mid LAD stent.  Plan for medical management of CAD  RBBB: stable by ECG today. No high risk alerts on patch but not formally read.  Incidental findings: pre TAVR CT showed the following    Small pulmonary nodules scattered throughout the lungs bilaterally measuring 4 mm or less in size, nonspecific, but statistically benign. No follow-up needed if patient is low-risk (and has no known or suspected primary neoplasm). Non-contrast chest CT can be considered in 12 months if patient is high-risk. He was a former smoker so will have this set up at 1 year.   4 mm hypervascular lesion in segment 2 of the liver, nonspecific but strongly favored to represent a benign lesions such as a small flash fill cavernous hemangioma. This could be definitively characterized with follow-up nonemergent abdominal MRI with and without IV gadolinium if of clinical concern. Will defer to PCP on whether this needs follow up.   Medication Adjustments/Labs and Tests Ordered: Current medicines are reviewed at length with the patient today.  Concerns regarding medicines are outlined above.  Medication changes, Labs and Tests ordered today are listed in the Patient Instructions  below. Patient Instructions  Medication Instructions:  1) You may STOP PLAVIX 6 months out from your procedure (around 06/12/2020) *If you need a refill on your cardiac medications before your next appointment, please call your pharmacy*  Follow-Up: Keep your appointment as scheduled with Dr. Tamala Julian.  We will contact you to arrange your 1 year visits!    Signed, Angelena Form, PA-C  01/12/2020 9:55 PM    [facility] [place], [place], [place]  [address] Phone: [phone]; Fax: [phone]

## 2020-01-13 ENCOUNTER — Telehealth: Payer: Self-pay

## 2020-01-13 NOTE — Telephone Encounter (Signed)
The patient requested at visit yesterday to have CD with echo images mailed to his home. CD made and mailed.

## 2020-01-16 DIAGNOSIS — I700 Atherosclerosis of aorta: Secondary | ICD-10-CM | POA: Diagnosis not present

## 2020-01-16 DIAGNOSIS — E1121 Type 2 diabetes mellitus with diabetic nephropathy: Secondary | ICD-10-CM | POA: Diagnosis not present

## 2020-01-16 DIAGNOSIS — R918 Other nonspecific abnormal finding of lung field: Secondary | ICD-10-CM | POA: Diagnosis not present

## 2020-01-16 DIAGNOSIS — D649 Anemia, unspecified: Secondary | ICD-10-CM | POA: Diagnosis not present

## 2020-01-16 DIAGNOSIS — I2510 Atherosclerotic heart disease of native coronary artery without angina pectoris: Secondary | ICD-10-CM | POA: Diagnosis not present

## 2020-01-16 DIAGNOSIS — N401 Enlarged prostate with lower urinary tract symptoms: Secondary | ICD-10-CM | POA: Diagnosis not present

## 2020-01-16 DIAGNOSIS — I129 Hypertensive chronic kidney disease with stage 1 through stage 4 chronic kidney disease, or unspecified chronic kidney disease: Secondary | ICD-10-CM | POA: Diagnosis not present

## 2020-01-16 DIAGNOSIS — N138 Other obstructive and reflux uropathy: Secondary | ICD-10-CM | POA: Diagnosis not present

## 2020-01-16 DIAGNOSIS — E785 Hyperlipidemia, unspecified: Secondary | ICD-10-CM | POA: Diagnosis not present

## 2020-01-16 DIAGNOSIS — C9110 Chronic lymphocytic leukemia of B-cell type not having achieved remission: Secondary | ICD-10-CM | POA: Diagnosis not present

## 2020-01-16 DIAGNOSIS — Z952 Presence of prosthetic heart valve: Secondary | ICD-10-CM | POA: Diagnosis not present

## 2020-01-16 DIAGNOSIS — N1832 Chronic kidney disease, stage 3b: Secondary | ICD-10-CM | POA: Diagnosis not present

## 2020-01-30 ENCOUNTER — Other Ambulatory Visit (HOSPITAL_COMMUNITY): Payer: Medicare Other

## 2020-01-30 ENCOUNTER — Other Ambulatory Visit: Payer: Self-pay | Admitting: Physician Assistant

## 2020-02-06 ENCOUNTER — Other Ambulatory Visit: Payer: Self-pay | Admitting: Interventional Cardiology

## 2020-02-09 ENCOUNTER — Ambulatory Visit: Payer: Medicare Other | Admitting: Cardiovascular Disease

## 2020-02-16 DIAGNOSIS — H524 Presbyopia: Secondary | ICD-10-CM | POA: Diagnosis not present

## 2020-02-16 DIAGNOSIS — E119 Type 2 diabetes mellitus without complications: Secondary | ICD-10-CM | POA: Diagnosis not present

## 2020-02-16 DIAGNOSIS — Z961 Presence of intraocular lens: Secondary | ICD-10-CM | POA: Diagnosis not present

## 2020-03-23 ENCOUNTER — Other Ambulatory Visit: Payer: Self-pay | Admitting: Physician Assistant

## 2020-03-23 NOTE — Telephone Encounter (Signed)
Pt's pharmacy is requesting a refill on pantoprazole. Would Dr. Mcalhany like to refill this medication? Please address 

## 2020-03-26 ENCOUNTER — Other Ambulatory Visit: Payer: Self-pay | Admitting: Physician Assistant

## 2020-04-09 NOTE — Progress Notes (Signed)
Cardiology Office Note:    Date:  04/11/2020   ID:  Barry Zavala, DOB December 31, 1940, MRN [id number]  PCP:  Josetta Huddle, MD  Cardiologist:  Sinclair Grooms, MD   Referring MD: Josetta Huddle, MD   Chief Complaint  Patient presents with  . Coronary Artery Disease  . Cardiac Valve Problem    History of Present Illness:    Barry Zavala is a 80 y.o. male with a hx of HTN, HLD, DMT2, RBBB, CKD stage IIIA, CADs/p PCI, GERD, obesity and severe ASs/p TAVR (12/13/19)    Barry Zavala is doing well.  He had TAVR in December.  He is back playing golf 5 to 7 days/week.  Fatigue and shortness of breath he experienced while walking on the golf course is resolved post TAVR.  He has not had angina.  No medication side effects.  He denies lower extremity swelling.  He has not had neurological symptoms and denies palpitations.  Past Medical History:  Diagnosis Date  . Anemia   . Arthritis    hands  . Chronic kidney disease (CKD), stage III (moderate) (HCC)   . Coronary artery disease   . Elevated PSA   . Erectile dysfunction   . GERD (gastroesophageal reflux disease)   . Hyperlipidemia   . Hypertension   . Irritable bowel syndrome   . 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 Myocardial infarction ([place]) 1995  . Obesity   . Onychomycosis   . Palmar fibromatosis   . Right bundle branch block   . 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 Angelena Form and Dr. Laverta Baltimore   . Severe aortic stenosis   . 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 Type 2 diabetes mellitus ([place])     Past Surgical History:  Procedure Laterality Date  . APPENDECTOMY    . CARDIAC CATHETERIZATION  2021  . cardiac stents    . CARPAL TUNNEL RELEASE Left 12/09/2016   Procedure: CARPAL TUNNEL RELEASE;  Surgeon: Daryll Brod, MD;  Location: [facility];  Service: Orthopedics;  Laterality: Left;  . CARPAL TUNNEL WITH CUBITAL TUNNEL Right 01/13/2017   Procedure: RIGHT CARPAL TUNNEL WITH CUBITAL TUNNEL RELEASE;  Surgeon: Daryll Brod, MD;  Location: [facility];  Service: Orthopedics;  Laterality: Right;  . EYE SURGERY Bilateral 2019   Dr. Gershon Crane  . INTRAVASCULAR PRESSURE WIRE/FFR STUDY N/A 11/15/2019   Procedure: INTRAVASCULAR PRESSURE WIRE/FFR STUDY;  Surgeon: Burnell Blanks, MD;  Location: [facility];  Service: Cardiovascular;  Laterality: N/A;  . RIGHT/LEFT HEART CATH AND CORONARY ANGIOGRAPHY N/A 02/02/2019   Procedure: RIGHT/LEFT HEART CATH AND CORONARY ANGIOGRAPHY;  Surgeon: Belva Crome, MD;  Location: [facility];  Service: Cardiovascular;  Laterality: N/A;  . RIGHT/LEFT HEART CATH AND CORONARY ANGIOGRAPHY N/A 11/15/2019   Procedure: RIGHT/LEFT HEART CATH AND CORONARY ANGIOGRAPHY;  Surgeon: Burnell Blanks, MD;  Location: [facility];  Service: Cardiovascular;  Laterality: N/A;  . TEE WITHOUT CARDIOVERSION N/A 12/13/2019   Procedure: TRANSESOPHAGEAL ECHOCARDIOGRAM (TEE);  Surgeon: Burnell Blanks, MD;  Location: [facility];  Service: Open Heart Surgery;  Laterality: N/A;  . TRANSCATHETER AORTIC VALVE REPLACEMENT, TRANSFEMORAL  12/13/2019  . TRANSCATHETER AORTIC VALVE REPLACEMENT, TRANSFEMORAL N/A 12/13/2019   Procedure: TRANSCATHETER AORTIC VALVE REPLACEMENT, TRANSFEMORAL;  Surgeon:  Burnell Blanks, MD;  Location: [facility];  Service: Open Heart Surgery;  Laterality: N/A;  . TRIGGER FINGER RELEASE Left 08/10/2018   Procedure: RELEASE TRIGGER FINGER/A-1 PULLEY THUMB AND LEFT SMALL;  Surgeon: Daryll Brod, MD;  Location: [facility];  Service: Orthopedics;  Laterality: Left;  FAB  . ULNAR NERVE TRANSPOSITION Left 12/09/2016   Procedure: DECOMPRESSION LEFT ULNAR NERVE;  Surgeon: Daryll Brod, MD;  Location: [facility];  Service: Orthopedics;   Laterality: Left;  . ULNAR NERVE TRANSPOSITION Right 01/13/2017   Procedure: DECOMPRESSION ULNAR NERVE;  Surgeon: Daryll Brod, MD;  Location: [facility];  Service: Orthopedics;  Laterality: Right;    Current Medications: Current Meds  Medication Sig  . ACTOS 45 MG tablet Take 45 mg by mouth daily with supper.   Marland Kitchen amoxicillin (AMOXIL) 500 MG tablet Take 4 tablets (2,000 mg total) one hour prior to all dental visits.  Marland Kitchen aspirin EC 81 MG tablet Take 1 tablet (81 mg total) by mouth daily.  . Cholecalciferol (VITAMIN D3 SUPER STRENGTH) 50 MCG (2000 UT) TABS Take 2,000 Units by mouth daily.  . clopidogrel (PLAVIX) 75 MG tablet TAKE 1 TABLET EVERY DAY WITH BREAKFAST.  Marland Kitchen empagliflozin (JARDIANCE) 10 MG TABS tablet Take 10 mg by mouth daily.   Marland Kitchen ezetimibe-simvastatin (VYTORIN) 10-20 MG tablet TAKE 1 TABLET BY MOUTH DAILY.  Marland Kitchen Glucosamine-Chondroitin (GLUCOSAMINE CHONDR COMPLEX PO) Take 1 capsule by mouth 2 (two) times daily.  Marland Kitchen glyBURIDE-metformin (GLUCOVANCE) 5-500 MG per tablet Take 2 tablets by mouth 2 (two) times daily with a meal.  . metoprolol succinate (TOPROL-XL) 50 MG 24 hr tablet Take 50 mg by mouth at bedtime. Take with or immediately following a meal.  . Multiple Vitamin (MULTIVITAMIN WITH MINERALS) TABS tablet Take 1 tablet by mouth daily. Centrum silver  . omega-3 acid ethyl esters (LOVAZA) 1 G capsule Take 1 g by mouth in the morning, at noon, and at bedtime.   . ONGLYZA 5 MG TABS tablet Take 5 mg by mouth daily with supper.   . pantoprazole (PROTONIX) 40 MG tablet TAKE 1 TABLET ONCE DAILY.  . ramipril (ALTACE) 10 MG capsule Take 10 mg by mouth daily with supper.      Allergies:   Patient has no active allergies.   Social History   Socioeconomic History  . Marital status: Married    Spouse name: Not on file  . Number of children: 2  . Years of education: Not on file  . Highest education level: Not on file  Occupational History  . Occupation: Retired Chief Executive Officer   Tobacco Use  . Smoking status: Former Smoker    Packs/day: 1.00    Years: 20.00    Pack years: 20.00    Types: Cigarettes    Quit date: 1988    Years since quitting: 34.2  . Smokeless tobacco: Never Used  Vaping Use  . Vaping Use: Never used  Substance and Sexual Activity  . Alcohol use: Yes    Alcohol/week: 0.0 standard drinks    Comment: social  . Drug use: No  . Sexual activity: Yes  Other Topics Concern  . Not on file  Social History Narrative  . Not on file   Social Determinants of Health   Financial Resource Strain: Not on file  Food Insecurity: Not on file  Transportation Needs: Not on file  Physical Activity: Not on file  Stress: Not on file  Social Connections: Not on file  Family History: The patient's family history includes Cancer in his paternal uncle; Heart failure in his father; Multiple myeloma (age of onset: 33) in his sister; Ovarian cancer (age of onset: 13) in his mother.  ROS:   Please see the history of present illness.    His wife was present on the phone as I examined Mr. Dia Sitter.  They are concerned about Covid booster shots.  I cannot give them any updated information.  I did generally recommend a second booster given their age.  All other systems reviewed and are negative.  EKGs/Labs/Other Studies Reviewed:    The following studies were reviewed today: 2D Doppler echocardiogram post TAVR January 2022: IMPRESSIONS    1. Left ventricular ejection fraction, by estimation, is 60 to 65%. The  left ventricle has normal function. The left ventricle has no regional  wall motion abnormalities. Left ventricular diastolic parameters are  consistent with Grade I diastolic  dysfunction (impaired relaxation).  2. Right ventricular systolic function is normal. The right ventricular  size is normal. There is normal pulmonary artery systolic pressure. The  estimated right ventricular systolic pressure is 18.2 mmHg.  3. The mitral valve is  normal in structure. Trivial mitral valve  regurgitation. No evidence of mitral stenosis.  4. The aortic valve has been repaired/replaced. Aortic valve  regurgitation is not visualized. There is a 23 mm Sapien prosthetic (TAVR)  valve present in the aortic position. Procedure Date: 12/13/19. Echo  findings are consistent with normal structure and  function of the aortic valve prosthesis. Aortic valve area, by VTI  measures 1.89 cm. Aortic valve mean gradient measures 16.0 mmHg.  5. Aortic dilatation noted. There is borderline dilatation of the aortic  root and of the ascending aorta, measuring 39 mm.  6. The inferior vena cava is normal in size with greater than 50%  respiratory variability, suggesting right atrial pressure of 3 mmHg.   EKG:  EKG not repeated  Recent Labs: 12/09/2019: ALT 19; B Natriuretic Peptide 81.8 12/14/2019: BUN 29; Creatinine, Ser 1.49; Hemoglobin 10.5; Magnesium 2.0; Platelets 170; Potassium 4.0; Sodium 137  Recent Lipid Panel    Component Value Date/Time   CHOL 83 (L) 01/24/2015 0949   TRIG 49 01/24/2015 0949   HDL 38 (L) 01/24/2015 0949   CHOLHDL 2.2 01/24/2015 0949   VLDL 10 01/24/2015 0949   LDLCALC 35 01/24/2015 0949    Physical Exam:    VS:  BP (!) 122/58   Pulse 88   Ht $R'5\' 11"'ho$  (1.803 m)   Wt 186 lb (84.4 kg)   BMI 25.94 kg/m     Wt Readings from Last 3 Encounters:  04/11/20 186 lb (84.4 kg)  01/12/20 187 lb 12.8 oz (85.2 kg)  12/22/19 191 lb 12.8 oz (87 kg)     GEN: Healthy-appearing. No acute distress HEENT: Normal NECK: No JVD. LYMPHATICS: No lymphadenopathy CARDIAC: 1-2 of 6 right upper sternal and subclavicular systolic murmur.  Aortic regurgitation is not audible.  RRR no gallop, or edema. VASCULAR:  Normal Pulses. No bruits. RESPIRATORY:  Clear to auscultation without rales, wheezing or rhonchi  ABDOMEN: Soft, non-tender, non-distended, No pulsatile mass, MUSCULOSKELETAL: No deformity  SKIN: Warm and dry NEUROLOGIC:  Alert  and oriented x 3 PSYCHIATRIC:  Normal affect   ASSESSMENT:    1. S/P TAVR (transcatheter aortic valve replacement)   2. Coronary artery disease involving native coronary artery of native heart with unstable angina pectoris ([place])   3. Right bundle branch block   4.  Essential hypertension   5. Mixed hyperlipidemia   6. Stage 3 chronic kidney disease, unspecified whether stage 3a or 3b CKD ([place])    PLAN:    In order of problems listed above:  1. Doing well post TAVR.  Post procedure echo demonstrates normal valve function.  Endocarditis prophylaxis reviewed.  He has not had palpitations, near syncope, orthopnea, PND, chest pain.  He denies neurological symptoms. 2. Secondary prevention reviewed.  Physical activity encouraged. 3. EKG is not repeated 4. Current blood pressure is excellent.  Continue current therapy. 5. Target LDL less than 70.  Most recent was less than 70. 6. We did not discuss kidney function.  Overall education and awareness concerning primary/secondary risk prevention was discussed in detail: LDL less than 70, hemoglobin A1c less than 7, blood pressure target less than 130/80 mmHg, >150 minutes of moderate aerobic activity per week, avoidance of smoking, weight control (via diet and exercise), and continued surveillance/management of/for obstructive sleep apnea.    Medication Adjustments/Labs and Tests Ordered: Current medicines are reviewed at length with the patient today.  Concerns regarding medicines are outlined above.  No orders of the defined types were placed in this encounter.  No orders of the defined types were placed in this encounter.   Patient Instructions  Medication Instructions:  Your physician recommends that you continue on your current medications as directed. Please refer to the Current Medication list given to you today.  *If you need a refill on your cardiac medications before your next appointment, please call your pharmacy*   Lab  Work: None If you have labs (blood work) drawn today and your tests are completely normal, you will receive your results only by: Marland Kitchen MyChart Message (if you have MyChart) OR . A paper copy in the mail If you have any lab test that is abnormal or we need to change your treatment, we will call you to review the results.   Testing/Procedures: None   Follow-Up: At [facility], you and your health needs are our priority.  As part of our continuing mission to provide you with exceptional heart care, we have created designated Provider Care Teams.  These Care Teams include your primary Cardiologist (physician) and Advanced Practice Providers (APPs -  Physician Assistants and Nurse Practitioners) who all work together to provide you with the care you need, when you need it.  We recommend signing up for the patient portal called "MyChart".  Sign up information is provided on this After Visit Summary.  MyChart is used to connect with patients for Virtual Visits (Telemedicine).  Patients are able to view lab/test results, encounter notes, upcoming appointments, etc.  Non-urgent messages can be sent to your provider as well.   To learn more about what you can do with MyChart, go to [email or web address].    Your next appointment:   6-8 month(s)  The format for your next appointment:   In Person  Provider:   You may see Sinclair Grooms, MD or one of the following Advanced Practice Providers on your designated Care Team:    Kathyrn Drown, NP    Other Instructions      Signed, Sinclair Grooms, MD  04/11/2020 9:09 AM    [place]

## 2020-04-11 ENCOUNTER — Encounter: Payer: Self-pay | Admitting: Interventional Cardiology

## 2020-04-11 ENCOUNTER — Other Ambulatory Visit: Payer: Self-pay

## 2020-04-11 ENCOUNTER — Ambulatory Visit (INDEPENDENT_AMBULATORY_CARE_PROVIDER_SITE_OTHER): Payer: Medicare Other | Admitting: Interventional Cardiology

## 2020-04-11 VITALS — BP 122/58 | HR 88 | Ht 71.0 in | Wt 186.0 lb

## 2020-04-11 DIAGNOSIS — N183 Chronic kidney disease, stage 3 unspecified: Secondary | ICD-10-CM

## 2020-04-11 DIAGNOSIS — Z952 Presence of prosthetic heart valve: Secondary | ICD-10-CM

## 2020-04-11 DIAGNOSIS — I10 Essential (primary) hypertension: Secondary | ICD-10-CM

## 2020-04-11 DIAGNOSIS — I4510 Unspecified right bundle-branch block: Secondary | ICD-10-CM

## 2020-04-11 DIAGNOSIS — I25110 Atherosclerotic heart disease of native coronary artery with unstable angina pectoris: Secondary | ICD-10-CM | POA: Diagnosis not present

## 2020-04-11 DIAGNOSIS — E782 Mixed hyperlipidemia: Secondary | ICD-10-CM | POA: Diagnosis not present

## 2020-04-11 NOTE — Patient Instructions (Signed)
Medication Instructions:  Your physician recommends that you continue on your current medications as directed. Please refer to the Current Medication list given to you today.  *If you need a refill on your cardiac medications before your next appointment, please call your pharmacy*   Lab Work: None If you have labs (blood work) drawn today and your tests are completely normal, you will receive your results only by: Marland Kitchen MyChart Message (if you have MyChart) OR . A paper copy in the mail If you have any lab test that is abnormal or we need to change your treatment, we will call you to review the results.   Testing/Procedures: None   Follow-Up: At [facility], you and your health needs are our priority.  As part of our continuing mission to provide you with exceptional heart care, we have created designated Provider Care Teams.  These Care Teams include your primary Cardiologist (physician) and Advanced Practice Providers (APPs -  Physician Assistants and Nurse Practitioners) who all work together to provide you with the care you need, when you need it.  We recommend signing up for the patient portal called "MyChart".  Sign up information is provided on this After Visit Summary.  MyChart is used to connect with patients for Virtual Visits (Telemedicine).  Patients are able to view lab/test results, encounter notes, upcoming appointments, etc.  Non-urgent messages can be sent to your provider as well.   To learn more about what you can do with MyChart, go to [email or web address].    Your next appointment:   6-8 month(s)  The format for your next appointment:   In Person  Provider:   You may see Sinclair Grooms, MD or one of the following Advanced Practice Providers on your designated Care Team:    Kathyrn Drown, NP    Other Instructions

## 2020-05-22 DIAGNOSIS — C9110 Chronic lymphocytic leukemia of B-cell type not having achieved remission: Secondary | ICD-10-CM | POA: Diagnosis not present

## 2020-05-22 DIAGNOSIS — Z952 Presence of prosthetic heart valve: Secondary | ICD-10-CM | POA: Diagnosis not present

## 2020-05-22 DIAGNOSIS — N401 Enlarged prostate with lower urinary tract symptoms: Secondary | ICD-10-CM | POA: Diagnosis not present

## 2020-05-22 DIAGNOSIS — I129 Hypertensive chronic kidney disease with stage 1 through stage 4 chronic kidney disease, or unspecified chronic kidney disease: Secondary | ICD-10-CM | POA: Diagnosis not present

## 2020-05-22 DIAGNOSIS — I10 Essential (primary) hypertension: Secondary | ICD-10-CM | POA: Diagnosis not present

## 2020-05-22 DIAGNOSIS — I5189 Other ill-defined heart diseases: Secondary | ICD-10-CM | POA: Diagnosis not present

## 2020-05-22 DIAGNOSIS — I2510 Atherosclerotic heart disease of native coronary artery without angina pectoris: Secondary | ICD-10-CM | POA: Diagnosis not present

## 2020-05-22 DIAGNOSIS — E1121 Type 2 diabetes mellitus with diabetic nephropathy: Secondary | ICD-10-CM | POA: Diagnosis not present

## 2020-05-22 DIAGNOSIS — N1832 Chronic kidney disease, stage 3b: Secondary | ICD-10-CM | POA: Diagnosis not present

## 2020-05-22 DIAGNOSIS — R918 Other nonspecific abnormal finding of lung field: Secondary | ICD-10-CM | POA: Diagnosis not present

## 2020-05-22 DIAGNOSIS — E785 Hyperlipidemia, unspecified: Secondary | ICD-10-CM | POA: Diagnosis not present

## 2020-05-22 DIAGNOSIS — I700 Atherosclerosis of aorta: Secondary | ICD-10-CM | POA: Diagnosis not present

## 2020-05-29 ENCOUNTER — Encounter: Payer: Self-pay | Admitting: Dermatology

## 2020-05-29 ENCOUNTER — Ambulatory Visit (INDEPENDENT_AMBULATORY_CARE_PROVIDER_SITE_OTHER): Payer: Medicare Other | Admitting: Dermatology

## 2020-05-29 ENCOUNTER — Other Ambulatory Visit: Payer: Self-pay

## 2020-05-29 DIAGNOSIS — L720 Epidermal cyst: Secondary | ICD-10-CM

## 2020-05-29 DIAGNOSIS — D485 Neoplasm of uncertain behavior of skin: Secondary | ICD-10-CM

## 2020-05-29 NOTE — Patient Instructions (Signed)

## 2020-06-07 ENCOUNTER — Encounter: Payer: Self-pay | Admitting: Dermatology

## 2020-06-11 ENCOUNTER — Encounter: Payer: Self-pay | Admitting: Dermatology

## 2020-06-11 NOTE — Progress Notes (Signed)
   Follow-Up Visit   Subjective  Barry Zavala is a 80 y.o. male who presents for the following: Annual Exam (Left outer eye crusty lesion just got a little larger x months).  Enlarging spot outside of left eye Location:  Duration:  Quality:  Associated Signs/Symptoms: Modifying Factors:  Severity:  Timing: Context:   Objective  Well appearing patient in no apparent distress; mood and affect are within normal limits. Objective  Left Malar Cheek: History of growth of appropriate gross left outer eye; this may simply be a small inflamed cyst but biopsy will be obtained to rule out a superficial carcinoma.         A focused examination was performed including Head and neck.. Relevant physical exam findings are noted in the Assessment and Plan.   Assessment & Plan    Neoplasm of uncertain behavior of skin Left Malar Cheek  Skin / nail biopsy Type of biopsy: tangential   Informed consent: discussed and consent obtained   Timeout: patient name, date of birth, surgical site, and procedure verified   Procedure prep:  Patient was prepped and draped in usual sterile fashion (Non sterile) Prep type:  Chlorhexidine Anesthesia: the lesion was anesthetized in a standard fashion   Anesthetic:  1% lidocaine w/ epinephrine 1-100,000 local infiltration Instrument used: flexible razor blade   Hemostasis achieved with: ferric subsulfate and electrodesiccation   Outcome: patient tolerated procedure well   Post-procedure details: wound care instructions given    Specimen 1 - Surgical pathology Differential Diagnosis: R/O Cyst - cautery after biopsy  Check Margins: No      I, Lavonna Monarch, MD, have reviewed all documentation for this visit.  The documentation on 06/11/20 for the exam, diagnosis, procedures, and orders are all accurate and complete.

## 2020-07-16 DIAGNOSIS — I10 Essential (primary) hypertension: Secondary | ICD-10-CM | POA: Diagnosis not present

## 2020-07-16 DIAGNOSIS — I350 Nonrheumatic aortic (valve) stenosis: Secondary | ICD-10-CM | POA: Diagnosis not present

## 2020-07-16 DIAGNOSIS — I2510 Atherosclerotic heart disease of native coronary artery without angina pectoris: Secondary | ICD-10-CM | POA: Diagnosis not present

## 2020-07-16 DIAGNOSIS — Z0000 Encounter for general adult medical examination without abnormal findings: Secondary | ICD-10-CM | POA: Diagnosis not present

## 2020-07-16 DIAGNOSIS — E559 Vitamin D deficiency, unspecified: Secondary | ICD-10-CM | POA: Diagnosis not present

## 2020-07-16 DIAGNOSIS — E1165 Type 2 diabetes mellitus with hyperglycemia: Secondary | ICD-10-CM | POA: Diagnosis not present

## 2020-07-16 DIAGNOSIS — E119 Type 2 diabetes mellitus without complications: Secondary | ICD-10-CM | POA: Diagnosis not present

## 2020-07-16 DIAGNOSIS — R7309 Other abnormal glucose: Secondary | ICD-10-CM | POA: Diagnosis not present

## 2020-07-16 DIAGNOSIS — Z7984 Long term (current) use of oral hypoglycemic drugs: Secondary | ICD-10-CM | POA: Diagnosis not present

## 2020-07-16 DIAGNOSIS — Z1389 Encounter for screening for other disorder: Secondary | ICD-10-CM | POA: Diagnosis not present

## 2020-07-16 DIAGNOSIS — R972 Elevated prostate specific antigen [PSA]: Secondary | ICD-10-CM | POA: Diagnosis not present

## 2020-07-16 DIAGNOSIS — Z79899 Other long term (current) drug therapy: Secondary | ICD-10-CM | POA: Diagnosis not present

## 2020-07-16 DIAGNOSIS — E782 Mixed hyperlipidemia: Secondary | ICD-10-CM | POA: Diagnosis not present

## 2020-07-25 ENCOUNTER — Ambulatory Visit: Payer: Medicare Other | Admitting: Dermatology

## 2020-07-27 DIAGNOSIS — Z23 Encounter for immunization: Secondary | ICD-10-CM | POA: Diagnosis not present

## 2020-07-31 ENCOUNTER — Other Ambulatory Visit: Payer: Self-pay | Admitting: Interventional Cardiology

## 2020-07-31 ENCOUNTER — Ambulatory Visit (INDEPENDENT_AMBULATORY_CARE_PROVIDER_SITE_OTHER): Payer: Medicare Other | Admitting: Dermatology

## 2020-07-31 ENCOUNTER — Other Ambulatory Visit: Payer: Self-pay

## 2020-07-31 DIAGNOSIS — L720 Epidermal cyst: Secondary | ICD-10-CM

## 2020-07-31 DIAGNOSIS — L821 Other seborrheic keratosis: Secondary | ICD-10-CM | POA: Diagnosis not present

## 2020-07-31 DIAGNOSIS — Z872 Personal history of diseases of the skin and subcutaneous tissue: Secondary | ICD-10-CM

## 2020-07-31 DIAGNOSIS — I25110 Atherosclerotic heart disease of native coronary artery with unstable angina pectoris: Secondary | ICD-10-CM

## 2020-07-31 DIAGNOSIS — J3489 Other specified disorders of nose and nasal sinuses: Secondary | ICD-10-CM

## 2020-08-16 ENCOUNTER — Encounter: Payer: Self-pay | Admitting: Dermatology

## 2020-08-16 NOTE — Progress Notes (Signed)
   Follow-Up Visit   Subjective  Barry Zavala is a 80 y.o. male who presents for the following: Follow-up (Patient has a list on paper of lesions he wants checked ).  Several spots that he and his wife would like checked Location:  Duration:  Quality:  Associated Signs/Symptoms: Modifying Factors:  Severity:  Timing: Context:   Objective  Well appearing patient in no apparent distress; mood and affect are within normal limits. Right Anterior Mandible 5 mm upper dermal papule with eccentric open pore, noninflamed  Dorsum of Nose History of bleeding on bridge of nose. No visible or palpable lesion there today.  I had the patient check in the mirror and he do could not be certain of the location.  Mid Forehead History of pimples on forehead.  Right Malar Cheek Stuck-on, waxy papules and plaques.     All skin waist up examined.   Assessment & Plan    Epidermal cyst Right Anterior Mandible  Benign; patient may choose future excision.  Lesion of nose Dorsum of Nose  Patient can follow up if lesion and bleeding recurs.  Asked him to put a tiny sharpie type red dot adjacent to the site if it bleeds again and we will try and work him in as soon as possible.  History of acne Mid Forehead  Patient can get over the counter benzoyl peroxide cleansers to see if that helps his face. X 2 times a week.  Seborrheic keratosis Right Malar Cheek  Benign no treatment needed.       I, Lavonna Monarch, MD, have reviewed all documentation for this visit.  The documentation on 08/16/20 for the exam, diagnosis, procedures, and orders are all accurate and complete.

## 2020-08-28 DIAGNOSIS — I2510 Atherosclerotic heart disease of native coronary artery without angina pectoris: Secondary | ICD-10-CM | POA: Diagnosis not present

## 2020-08-28 DIAGNOSIS — N1832 Chronic kidney disease, stage 3b: Secondary | ICD-10-CM | POA: Diagnosis not present

## 2020-08-28 DIAGNOSIS — N401 Enlarged prostate with lower urinary tract symptoms: Secondary | ICD-10-CM | POA: Diagnosis not present

## 2020-08-28 DIAGNOSIS — I5189 Other ill-defined heart diseases: Secondary | ICD-10-CM | POA: Diagnosis not present

## 2020-08-28 DIAGNOSIS — I129 Hypertensive chronic kidney disease with stage 1 through stage 4 chronic kidney disease, or unspecified chronic kidney disease: Secondary | ICD-10-CM | POA: Diagnosis not present

## 2020-08-28 DIAGNOSIS — I700 Atherosclerosis of aorta: Secondary | ICD-10-CM | POA: Diagnosis not present

## 2020-08-28 DIAGNOSIS — E1129 Type 2 diabetes mellitus with other diabetic kidney complication: Secondary | ICD-10-CM | POA: Diagnosis not present

## 2020-08-28 DIAGNOSIS — C9110 Chronic lymphocytic leukemia of B-cell type not having achieved remission: Secondary | ICD-10-CM | POA: Diagnosis not present

## 2020-08-28 DIAGNOSIS — E785 Hyperlipidemia, unspecified: Secondary | ICD-10-CM | POA: Diagnosis not present

## 2020-08-28 DIAGNOSIS — R918 Other nonspecific abnormal finding of lung field: Secondary | ICD-10-CM | POA: Diagnosis not present

## 2020-08-28 DIAGNOSIS — Z952 Presence of prosthetic heart valve: Secondary | ICD-10-CM | POA: Diagnosis not present

## 2020-08-30 DIAGNOSIS — R972 Elevated prostate specific antigen [PSA]: Secondary | ICD-10-CM | POA: Diagnosis not present

## 2020-09-04 ENCOUNTER — Other Ambulatory Visit: Payer: Self-pay | Admitting: Physician Assistant

## 2020-09-04 DIAGNOSIS — Z952 Presence of prosthetic heart valve: Secondary | ICD-10-CM

## 2020-09-06 DIAGNOSIS — R3915 Urgency of urination: Secondary | ICD-10-CM | POA: Diagnosis not present

## 2020-09-06 DIAGNOSIS — R972 Elevated prostate specific antigen [PSA]: Secondary | ICD-10-CM | POA: Diagnosis not present

## 2020-09-06 DIAGNOSIS — R81 Glycosuria: Secondary | ICD-10-CM | POA: Diagnosis not present

## 2020-09-24 DIAGNOSIS — Z23 Encounter for immunization: Secondary | ICD-10-CM | POA: Diagnosis not present

## 2020-10-04 DIAGNOSIS — Z961 Presence of intraocular lens: Secondary | ICD-10-CM | POA: Diagnosis not present

## 2020-10-04 DIAGNOSIS — H26492 Other secondary cataract, left eye: Secondary | ICD-10-CM | POA: Diagnosis not present

## 2020-10-04 DIAGNOSIS — H353131 Nonexudative age-related macular degeneration, bilateral, early dry stage: Secondary | ICD-10-CM | POA: Diagnosis not present

## 2020-10-31 DIAGNOSIS — H26492 Other secondary cataract, left eye: Secondary | ICD-10-CM | POA: Diagnosis not present

## 2020-11-02 DIAGNOSIS — Z23 Encounter for immunization: Secondary | ICD-10-CM | POA: Diagnosis not present

## 2020-11-09 NOTE — Progress Notes (Addendum)
Cardiology Office Note:    Date:  11/12/2020   ID:  Barry Zavala, DOB 09/22/1940, MRN [id number]  PCP:  Barry Huddle, MD  Cardiologist:  Barry Grooms, MD   Referring MD: Barry Huddle, MD   Chief Complaint  Patient presents with   Follow-up    History of Present Illness:    Barry Zavala is a 80 y.o. male with a hx of HTN, HLD, DMT2, RBBB, CKD stage IIIA, CAD s/p PCI, GERD, obesity and severe AS s/p TAVR (12/13/19)    Barry Zavala is doing well now 11 months post transcatheter aortic valve replacement.  He denies orthopnea, PND, syncope, palpitations, lower extremity edema, and transient neurological symptoms.  He has back playing golf.  He can carry out all of his typical daily activities without dyspnea or limitations.  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 Angelena Form and Dr. 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 Gershon Crane   INTRAVASCULAR PRESSURE WIRE/FFR STUDY N/A 11/15/2019   Procedure: INTRAVASCULAR PRESSURE WIRE/FFR STUDY;  Surgeon: Burnell Blanks, MD;  Location: [place] CV LAB;  Service: Cardiovascular;  Laterality: N/A;   RIGHT/LEFT HEART CATH AND CORONARY ANGIOGRAPHY N/A 02/02/2019   Procedure: RIGHT/LEFT HEART CATH AND CORONARY ANGIOGRAPHY;  Surgeon: Belva Crome, MD;  Location: [place] [facility] LAB;  Service: Cardiovascular;  Laterality: N/A;   RIGHT/LEFT HEART CATH AND CORONARY ANGIOGRAPHY N/A 11/15/2019   Procedure: RIGHT/LEFT HEART CATH AND CORONARY ANGIOGRAPHY;  Surgeon: Burnell Blanks, MD;  Location: [place] CV LAB;  Service: Cardiovascular;  Laterality: N/A;   TEE WITHOUT CARDIOVERSION N/A 12/13/2019   Procedure: TRANSESOPHAGEAL ECHOCARDIOGRAM (TEE);  Surgeon: Burnell Blanks, MD;  Location: [place];  Service: Open Heart Surgery;  Laterality: N/A;   TRANSCATHETER AORTIC VALVE REPLACEMENT, TRANSFEMORAL  12/13/2019   TRANSCATHETER AORTIC VALVE REPLACEMENT, TRANSFEMORAL N/A 12/13/2019   Procedure: TRANSCATHETER AORTIC VALVE REPLACEMENT, TRANSFEMORAL;  Surgeon: Burnell Blanks, MD;  Location: [place];  Service: Open Heart Surgery;  Laterality: N/A;   TRIGGER FINGER RELEASE Left 08/10/2018  Procedure: RELEASE TRIGGER FINGER/A-1 PULLEY THUMB AND LEFT SMALL;  Surgeon: Daryll Brod, MD;  Location: [place];  Service: Orthopedics;  Laterality: Left;  FAB   ULNAR NERVE TRANSPOSITION Left 12/09/2016   Procedure: DECOMPRESSION LEFT ULNAR NERVE;  Surgeon: Daryll Brod, MD;  Location: [place];  Service: Orthopedics;  Laterality: Left;   ULNAR NERVE TRANSPOSITION Right 01/13/2017   Procedure: DECOMPRESSION ULNAR NERVE;  Surgeon: Daryll Brod, MD;  Location: [place];  Service: Orthopedics;  Laterality: Right;    Current Medications: Current Meds  Medication Sig   ACTOS 45 MG tablet Take 45 mg by mouth daily with supper.    amoxicillin (AMOXIL) 500 MG tablet Take 4 tablets (2,000 mg total) one hour prior to all dental visits.   aspirin EC 81 MG tablet Take 1 tablet (81 mg total) by mouth daily.   Dulaglutide (TRULICITY) 9.51 OA/4.1YS SOPN    empagliflozin (JARDIANCE) 10 MG TABS tablet Take 10 mg by mouth daily.    ezetimibe-simvastatin (VYTORIN) 10-20 MG tablet TAKE 1 TABLET BY MOUTH DAILY.   Glucosamine-Chondroitin (GLUCOSAMINE CHONDR COMPLEX PO) Take 1 capsule by mouth 2 (two) times daily.   glyBURIDE-metformin (GLUCOVANCE) 5-500 MG per tablet Take 2 tablets by mouth 2 (two) times daily with a meal.   metoprolol succinate (TOPROL-XL) 50 MG 24 hr tablet Take 50 mg by mouth at bedtime. Take with or immediately following a meal.   Multiple Vitamin (MULTIVITAMIN WITH MINERALS) TABS tablet Take 1 tablet by mouth daily. Centrum silver   omega-3 acid ethyl esters (LOVAZA) 1 G capsule Take 1 g by mouth in the morning, at noon, and at bedtime.    ONGLYZA 5 MG TABS tablet Take 5 mg by mouth daily with supper.   ramipril (ALTACE) 10 MG capsule Take 10 mg by mouth daily with supper.      Allergies:   Patient has no known allergies.   Social History   Socioeconomic History   Marital status: Married    Spouse name: Not on file   Number of children: 2   Years of education: Not on file   Highest education level: Not on file  Occupational History   Occupation: Retired Chief Executive Officer  Tobacco Use   Smoking status: Former    Packs/day: 1.00    Years: 20.00    Pack years: 20.00    Types: Cigarettes    Quit date: 1988    Years since quitting: 34.8   Smokeless tobacco: Never  Vaping Use   Vaping Use: Never used  Substance and Sexual Activity   Alcohol use: Yes    Alcohol/week: 0.0 standard drinks    Comment:  social   Drug use: No   Sexual activity: Yes  Other Topics Concern   Not on file  Social History Narrative   Not on file   Social Determinants of Health   Financial Resource Strain: Not on file  Food Insecurity: Not on file  Transportation Needs: Not on file  Physical Activity: Not on file  Stress: Not on file  Social Connections: Not on file     Family History: The patient's family history includes Cancer in his paternal uncle; Heart failure in his father; Multiple myeloma (age of onset: 37) in his sister; Ovarian cancer (age of onset: 52) in his mother.  ROS:   Please see the history of present illness.    Experiencing belching/indigestion with chicken soup with noodles.  He occasionally has cold hands and air-conditioned  spaces.  No edema, orthopnea, palpitations, or syncope.  All other systems reviewed and are negative.  EKGs/Labs/Other Studies Reviewed:    The following studies were reviewed today:  2D Doppler Echocardiogram 11/12/2020: IMPRESSIONS      1. Left ventricular ejection fraction, by estimation, is 60 to 65%. The left ventricle has normal function. The left ventricle has no regional wall motion abnormalities. Left ventricular diastolic parameters are indeterminate.  2. Right ventricular systolic function is normal. The right ventricular size is normal.  3. The mitral valve is normal in structure. Trivial mitral valve regurgitation. No evidence of mitral stenosis.  4. The aortic valve has been repaired/replaced. Aortic valve regurgitation is not visualized. There is a 23 mm Sapien prosthetic (TAVR) valve present in the aortic position. Procedure Date: 12/23/2019. Echo findings are consistent with normal structure  and function of the aortic valve prosthesis. Aortic valve area, by VTI measures 1.21 cm. Aortic valve mean gradient measures 17.0 mmHg. Aortic valve Vmax measures 2.90 m/s.   Comparison(s): No significant change from prior study. EKG:  EKG reveals  right bundle branch block, normal sinus rhythm, and slight left axis.  EKG is not changed when compared to prior.  Recent Labs: 12/09/2019: ALT 19; B Natriuretic Peptide 81.8 12/14/2019: BUN 29; Creatinine, Ser 1.49; Hemoglobin 10.5; Magnesium 2.0; Platelets 170; Potassium 4.0; Sodium 137  Recent Lipid Panel    Component Value Date/Time   CHOL 83 (L) 01/24/2015 0949   TRIG 49 01/24/2015 0949   HDL 38 (L) 01/24/2015 0949   CHOLHDL 2.2 01/24/2015 0949   VLDL 10 01/24/2015 0949   LDLCALC 35 01/24/2015 0949    Physical Exam:    VS:  BP (!) 130/58   Pulse 66   Ht $R'5\' 11"'tC$  (1.803 m)   Wt 181 lb 3.2 oz (82.2 kg)   SpO2 98%   BMI 25.27 kg/m     Wt Readings from Last 3 Encounters:  11/12/20 181 lb 3.2 oz (82.2 kg)  04/11/20 186 lb (84.4 kg)  01/12/20 187 lb 12.8 oz (85.2 kg)     GEN: Compatible with age. No acute distress HEENT: Normal NECK: No JVD. LYMPHATICS: No lymphadenopathy CARDIAC: 2/6 systolic but no diastolic murmur. RRR no gallop, or edema. VASCULAR: normal Pulses. No bruits. RESPIRATORY:  Clear to auscultation without rales, wheezing or rhonchi  ABDOMEN: Soft, non-tender, non-distended, No pulsatile mass, MUSCULOSKELETAL: No deformity  SKIN: Warm and dry NEUROLOGIC:  Alert and oriented x 3 PSYCHIATRIC:  Normal affect   ASSESSMENT:    1. S/P TAVR (transcatheter aortic valve replacement)   2. CAD S/P percutaneous coronary angioplasty   3. Stage 3 chronic kidney disease, unspecified whether stage 3a or 3b CKD ([place])   4. Non-insulin treated type 2 diabetes mellitus ([place])   5. Mixed hyperlipidemia   6. Essential hypertension   7. Right bundle branch block    PLAN:    In order of problems listed above:  He is status post TAVR.  New York Heart Association functional class II.  No heart failure symptoms or suggestion of valve dysfunction. Secondary prevention reviewed.   Most recent creatinine 1.57 in July. Hemoglobin A1c 7.10 August 2020.  We talked about the  importance of better glycemic control through diet and the medication regimen.  My thought would be to consider increasing empagliflozin to 25 mg/day but will leave this to primary care. LDL target less than 70.  Continue Vytorin.  Last LDL 75 in July. Blood pressure control is excellent.  Target  140/80 mmHg. Unchanged   Overall doing well.  Continue preventive management.  1 year follow-up.   ADDENDUM to above note: Echo demonstrates normal TAVR valve function  Medication Adjustments/Labs and Tests Ordered: Current medicines are reviewed at length with the patient today.  Concerns regarding medicines are outlined above.  Orders Placed This Encounter  Procedures   EKG 12-Lead   No orders of the defined types were placed in this encounter.   Patient Instructions  Medication Instructions:  Your physician recommends that you continue on your current medications as directed. Please refer to the Current Medication list given to you today.  *If you need a refill on your cardiac medications before your next appointment, please call your pharmacy*   Lab Work: None If you have labs (blood work) drawn today and your tests are completely normal, you will receive your results only by: [place] (if you have MyChart) OR A paper copy in the mail If you have any lab test that is abnormal or we need to change your treatment, we will call you to review the results.   Testing/Procedures: None   Follow-Up: At [facility], you and your health needs are our priority.  As part of our continuing mission to provide you with exceptional heart care, we have created designated Provider Care Teams.  These Care Teams include your primary Cardiologist (physician) and Advanced Practice Providers (APPs -  Physician Assistants and Nurse Practitioners) who all work together to provide you with the care you need, when you need it.  We recommend signing up for the patient portal called "MyChart".   Sign up information is provided on this After Visit Summary.  MyChart is used to connect with patients for Virtual Visits (Telemedicine).  Patients are able to view lab/test results, encounter notes, upcoming appointments, etc.  Non-urgent messages can be sent to your provider as well.   To learn more about what you can do with MyChart, go to [email or web address].    Your next appointment:   1 year(s)  The format for your next appointment:   In Person  Provider:   Sinclair Grooms, MD     Other Instructions     Signed, Barry Grooms, MD  11/12/2020 10:13 AM    [place]

## 2020-11-12 ENCOUNTER — Encounter: Payer: Self-pay | Admitting: Interventional Cardiology

## 2020-11-12 ENCOUNTER — Other Ambulatory Visit: Payer: Self-pay

## 2020-11-12 ENCOUNTER — Ambulatory Visit (HOSPITAL_COMMUNITY): Payer: Medicare Other | Attending: Cardiology

## 2020-11-12 ENCOUNTER — Ambulatory Visit (INDEPENDENT_AMBULATORY_CARE_PROVIDER_SITE_OTHER): Payer: Medicare Other | Admitting: Interventional Cardiology

## 2020-11-12 VITALS — BP 130/58 | HR 66 | Ht 71.0 in | Wt 181.2 lb

## 2020-11-12 DIAGNOSIS — Z952 Presence of prosthetic heart valve: Secondary | ICD-10-CM

## 2020-11-12 DIAGNOSIS — N183 Chronic kidney disease, stage 3 unspecified: Secondary | ICD-10-CM | POA: Diagnosis not present

## 2020-11-12 DIAGNOSIS — E782 Mixed hyperlipidemia: Secondary | ICD-10-CM | POA: Diagnosis not present

## 2020-11-12 DIAGNOSIS — Z9861 Coronary angioplasty status: Secondary | ICD-10-CM | POA: Diagnosis not present

## 2020-11-12 DIAGNOSIS — I10 Essential (primary) hypertension: Secondary | ICD-10-CM | POA: Diagnosis not present

## 2020-11-12 DIAGNOSIS — I2510 Atherosclerotic heart disease of native coronary artery without angina pectoris: Secondary | ICD-10-CM

## 2020-11-12 DIAGNOSIS — I4510 Unspecified right bundle-branch block: Secondary | ICD-10-CM

## 2020-11-12 DIAGNOSIS — E119 Type 2 diabetes mellitus without complications: Secondary | ICD-10-CM | POA: Diagnosis not present

## 2020-11-12 LAB — ECHOCARDIOGRAM COMPLETE
AR max vel: 1.02 cm2
AV Area VTI: 1.21 cm2
AV Area mean vel: 1.02 cm2
AV Mean grad: 17 mmHg
AV Peak grad: 33.6 mmHg
Ao pk vel: 2.9 m/s
Area-P 1/2: 2.91 cm2
S' Lateral: 2.3 cm

## 2020-11-12 NOTE — Patient Instructions (Signed)

## 2020-11-26 ENCOUNTER — Ambulatory Visit: Payer: Medicare Other | Admitting: Physician Assistant

## 2021-01-22 DIAGNOSIS — E782 Mixed hyperlipidemia: Secondary | ICD-10-CM | POA: Diagnosis not present

## 2021-01-22 DIAGNOSIS — E559 Vitamin D deficiency, unspecified: Secondary | ICD-10-CM | POA: Diagnosis not present

## 2021-01-22 DIAGNOSIS — I10 Essential (primary) hypertension: Secondary | ICD-10-CM | POA: Diagnosis not present

## 2021-01-22 DIAGNOSIS — I2510 Atherosclerotic heart disease of native coronary artery without angina pectoris: Secondary | ICD-10-CM | POA: Diagnosis not present

## 2021-01-22 DIAGNOSIS — E1165 Type 2 diabetes mellitus with hyperglycemia: Secondary | ICD-10-CM | POA: Diagnosis not present

## 2021-01-22 DIAGNOSIS — Z79899 Other long term (current) drug therapy: Secondary | ICD-10-CM | POA: Diagnosis not present

## 2021-01-22 DIAGNOSIS — I350 Nonrheumatic aortic (valve) stenosis: Secondary | ICD-10-CM | POA: Diagnosis not present

## 2021-01-22 DIAGNOSIS — R972 Elevated prostate specific antigen [PSA]: Secondary | ICD-10-CM | POA: Diagnosis not present

## 2021-01-22 DIAGNOSIS — Z7984 Long term (current) use of oral hypoglycemic drugs: Secondary | ICD-10-CM | POA: Diagnosis not present

## 2021-02-04 DIAGNOSIS — I5189 Other ill-defined heart diseases: Secondary | ICD-10-CM | POA: Diagnosis not present

## 2021-02-04 DIAGNOSIS — C9110 Chronic lymphocytic leukemia of B-cell type not having achieved remission: Secondary | ICD-10-CM | POA: Diagnosis not present

## 2021-02-04 DIAGNOSIS — E785 Hyperlipidemia, unspecified: Secondary | ICD-10-CM | POA: Diagnosis not present

## 2021-02-04 DIAGNOSIS — I10 Essential (primary) hypertension: Secondary | ICD-10-CM | POA: Diagnosis not present

## 2021-02-04 DIAGNOSIS — N1832 Chronic kidney disease, stage 3b: Secondary | ICD-10-CM | POA: Diagnosis not present

## 2021-02-04 DIAGNOSIS — I700 Atherosclerosis of aorta: Secondary | ICD-10-CM | POA: Diagnosis not present

## 2021-02-04 DIAGNOSIS — E1129 Type 2 diabetes mellitus with other diabetic kidney complication: Secondary | ICD-10-CM | POA: Diagnosis not present

## 2021-02-04 DIAGNOSIS — Z952 Presence of prosthetic heart valve: Secondary | ICD-10-CM | POA: Diagnosis not present

## 2021-02-04 DIAGNOSIS — I129 Hypertensive chronic kidney disease with stage 1 through stage 4 chronic kidney disease, or unspecified chronic kidney disease: Secondary | ICD-10-CM | POA: Diagnosis not present

## 2021-02-04 DIAGNOSIS — R918 Other nonspecific abnormal finding of lung field: Secondary | ICD-10-CM | POA: Diagnosis not present

## 2021-02-04 DIAGNOSIS — I2510 Atherosclerotic heart disease of native coronary artery without angina pectoris: Secondary | ICD-10-CM | POA: Diagnosis not present

## 2021-02-11 ENCOUNTER — Other Ambulatory Visit: Payer: Self-pay | Admitting: Interventional Cardiology

## 2021-03-28 ENCOUNTER — Other Ambulatory Visit: Payer: Self-pay | Admitting: Physician Assistant

## 2021-03-28 MED ORDER — AMOXICILLIN 500 MG PO TABS: ORAL_TABLET | ORAL | 2 refills | Status: DC

## 2021-04-22 DIAGNOSIS — I10 Essential (primary) hypertension: Secondary | ICD-10-CM | POA: Diagnosis not present

## 2021-04-24 ENCOUNTER — Telehealth: Payer: Self-pay | Admitting: Hematology

## 2021-04-24 NOTE — Telephone Encounter (Signed)
Scheduled appt per 4/18 referral. Pt is aware of appt date and time. Pt is aware to arrive 15 mins prior to appt time and to bring and updated insurance card. Pt is aware of appt location.   ?

## 2021-05-06 ENCOUNTER — Other Ambulatory Visit: Payer: Self-pay

## 2021-05-06 ENCOUNTER — Inpatient Hospital Stay: Payer: Medicare Other | Attending: Hematology | Admitting: Hematology

## 2021-05-06 VITALS — BP 127/55 | HR 69 | Temp 97.7°F | Resp 20 | Wt 183.8 lb

## 2021-05-06 DIAGNOSIS — C9110 Chronic lymphocytic leukemia of B-cell type not having achieved remission: Secondary | ICD-10-CM

## 2021-05-06 NOTE — Progress Notes (Signed)
? ? ?HEMATOLOGY/ONCOLOGY CLINIC NOTE ? ?Date of Service: 05/06/2021 ? ?Patient Care Team: ?Josetta Huddle, MD as PCP - General (Internal Medicine) ?Belva Crome, MD as PCP - Cardiology (Cardiology) ?Lavonna Monarch, MD as Consulting Physician (Dermatology) ? ?CHIEF COMPLAINTS/PURPOSE OF CONSULTATION:  ?Delayed follow-up for continued evaluation and management of chronic lymphocytic leukemia. ? ?HISTORY OF PRESENTING ILLNESS: 02/2009 ? ?Barry Zavala is a wonderful 81 y.o. male who has been referred to [place] by Dr Josetta Huddle for evaluation and management of leukocytosis (possible lymphoma). We are joined by his wife, Barry Zavala, via phone. The pt reports that he is doing well overall. ? 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 He went to see his Dermatologist, in the summer, who prescribed 5% Betamethasone Dipropionate. He has been having to use this cream over a large portion of his body. His rash has improved but has not gone away.   ? ?He received his first dose of the COVID19 vaccine on 01/14/2019. His WBC was around 17K at this time. He saw his Cardiologist, Dr. Tamala Julian, on 02/02/2019 and found that his WBC had decreased to 13K. He received the second dose of the COVID19 vaccine on 02/04/2019.  ? ?He has not felt any different in the last 3-6 months. His other chronic conditions are currently being well managed. Pt golfs 4-5 days per week if the weather allows. He has been on Jardiance for some time and has not needed to be on steroids for any reason within the last 6 months. Pt was a previous smoker and quit in 1988. He does not drink any alcohol. Pt is a retired Engineer, technical sales. Pt has had some previous dental issues that were corrected by a Periodontist. He denies any current issues with his gums. Pt has never needed a blood transfusion and has not had any tattoos. He has benign prostatitis and there have been recent elevations in PSA levels. His sister passed from Multiple Myeloma but he is not  aware of any other blood disorder or cancers.  ? ?Most recent lab results (02/02/2019) of CBC and BMP is as follows: all values are WNL except for WBC at 13.9K, RBC at 4.05, Hgb at 12.5, HCT at 38.1 ? ?On review of systems, pt denies fevers, chills, night sweats, unexpected weight loss, abdominal pain, chest pain, SOB, new lumps/bumps, sore throat, rhinorrhea, dysuria, hematuria, difficulty starting urine stream, forcing urine stream and any other symptoms.  ? ?On PMHx the pt reports Benign Prostatitis, CAD, Elevated PSA, GERD, HLD, HTN, Type 2 Diabetes. ?On Social Hx the pt reports that he is a previous smoker (quit in 1988), does not drink any alcohol. He is a retired Chief Executive Officer. ?On Family Hx the pt reports that he had a sister who passed from Multiple Myeloma ? ?INTERVAL HISTORY:  ?Barry Zavala is here for continued valuation and management of his CLL after his last clinic visit with [place] about 2 years ago in February 2021. ?He notes that he has had no acute issues from a CLL standpoint.  Fevers no chills no night sweats.  No new enlarged lymph nodes.  No abdominal pain or distention. ? ?He did have a TAVR placed on 12/13/2019 and notes that his shortness of breath significantly improved since he had that done and he is able to play golf more comfortably. ? ?His other medical issues including his hypertension and diabetes have been well controlled. ? 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 Lavonna Monarch and he continues to have yearly follow-up for dermatology. ? ?He recently had labs with his primary care physician in April about 2 weeks ago which were reviewed with him in detail. ? ?MEDICAL HISTORY:  ?Past Medical History:  ?Diagnosis Date  ? Anemia   ? Arthritis   ? hands  ? Chronic kidney disease (CKD), stage III (moderate) (HCC)   ? Coronary artery disease   ? Elevated PSA   ? Erectile dysfunction   ? GERD (gastroesophageal reflux  disease)   ? Hyperlipidemia   ? Hypertension   ? Irritable bowel syndrome   ? Leukocytosis   ? 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 Myocardial infarction [facility]) 1995  ? Obesity   ? Onychomycosis   ? Palmar fibromatosis   ? Right bundle branch block   ? 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 s/p TAVR with 23 mm Edwards S3U via the TF approach by Dr. Angelena Form and Dr. Laverta Baltimore   ? Severe aortic stenosis   ? Squamous cell carcinoma of skin 05/13/2016  ? bridge of nose (cx85f)  ? Type 2 diabetes mellitus ([place]   ? ? ?SURGICAL HISTORY: ?Past Surgical History:  ?Procedure Laterality Date  ? APPENDECTOMY    ? CARDIAC CATHETERIZATION  2021  ? cardiac stents    ? CARPAL TUNNEL RELEASE Left 12/09/2016  ? Procedure: CARPAL TUNNEL RELEASE;  Surgeon: KDaryll Brod MD;  [place]  Service: Orthopedics;  Laterality: Left;  ? CARPAL TUNNEL WITH CUBITAL TUNNEL Right 01/13/2017  ? Procedure: RIGHT CARPAL TUNNEL WITH CUBITAL TUNNEL RELEASE;  Surgeon: KDaryll Brod MD;  [place]  Service: Orthopedics;  Laterality: Right;  ? EYE SURGERY Bilateral 2019  ? Dr. SGershon Crane ? INTRAVASCULAR PRESSURE WIRE/FFR STUDY N/A 11/15/2019  ? Procedure: INTRAVASCULAR PRESSURE WIRE/FFR STUDY;  Surgeon: MBurnell Blanks MD;  [place];  Service: Cardiovascular;  Laterality: N/A;  ? RIGHT/LEFT HEART CATH AND CORONARY ANGIOGRAPHY N/A 02/02/2019  ? Procedure: RIGHT/LEFT HEART CATH AND CORONARY ANGIOGRAPHY;  Surgeon: SBelva Crome MD;  [place];  Service: Cardiovascular;  Laterality: N/A;  ? RIGHT/LEFT HEART CATH AND CORONARY ANGIOGRAPHY N/A 11/15/2019  ? Procedure: RIGHT/LEFT HEART CATH AND CORONARY ANGIOGRAPHY;  Surgeon: MBurnell Blanks MD;  [place];  Service: Cardiovascular;  Laterality: N/A;  ? TEE WITHOUT CARDIOVERSION N/A 12/13/2019  ? Procedure:  TRANSESOPHAGEAL ECHOCARDIOGRAM (TEE);  Surgeon: MBurnell Blanks MD;  [place]  Service: Open Heart Surgery;  Laterality: N/A;  ? TRANSCATHETER AORTIC VALVE REPLACEMENT, TRANSFEMORAL  12/13/2019  ? TRANSCATHETER AORTIC VALVE REPLACEMENT, TRANSFEMORAL N/A 12/13/2019  ? Procedure: TRANSCATHETER AORTIC VALVE REPLACEMENT, TRANSFEMORAL;  Surgeon: MBurnell Blanks MD;  [place]  Service: Open Heart Surgery;  Laterality: N/A;  ? TRIGGER FINGER RELEASE Left 08/10/2018  ? Procedure: RELEASE TRIGGER FINGER/A-1 PULLEY THUMB AND LEFT SMALL;  Surgeon: KDaryll Brod MD;  [place]  Service: Orthopedics;  Laterality: Left;  FAB  ? ULNAR NERVE TRANSPOSITION Left 12/09/2016  ? Procedure: DECOMPRESSION LEFT ULNAR NERVE;  Surgeon: KDaryll Brod MD;  [place]  Service: Orthopedics;  Laterality: Left;  ? ULNAR NERVE TRANSPOSITION Right 01/13/2017  ? Procedure: DECOMPRESSION ULNAR NERVE;  Surgeon: KDaryll Brod MD;  [place]  Service: Orthopedics;  Laterality: Right;  ? ? ?  SOCIAL HISTORY: ?Social History  ? ?Socioeconomic History  ? Marital status: Married  ?  Spouse name: Not on file  ? Number of children: 2  ? Years of education: Not on file  ? Highest education level: Not on file  ?Occupational History  ? Occupation: Retired Chief Executive Officer  ?Tobacco Use  ? Smoking status: Former  ?  Packs/day: 1.00  ?  Years: 20.00  ?  Pack years: 20.00  ?  Types: Cigarettes  ?  Quit date: 72  ?  Years since quitting: 35.3  ? Smokeless tobacco: Never  ?Vaping Use  ? Vaping Use: Never used  ?Substance and Sexual Activity  ? Alcohol use: Yes  ?  Alcohol/week: 0.0 standard drinks  ?  Comment: social  ? Drug use: No  ? Sexual activity: Yes  ?Other Topics Concern  ? Not on file  ?Social History Narrative  ? Not on file  ? ?Social Determinants of Health  ? ?Financial Resource Strain: Not on file  ?Food Insecurity: Not on file  ?Transportation Needs: Not on  file  ?Physical Activity: Not on file  ?Stress: Not on file  ?Social Connections: Not on file  ?Intimate Partner Violence: Not on file  ? ? ?FAMILY HISTORY: ?Family History  ?Problem Relation Age of Onset

## 2021-08-06 DIAGNOSIS — E559 Vitamin D deficiency, unspecified: Secondary | ICD-10-CM | POA: Diagnosis not present

## 2021-08-06 DIAGNOSIS — E1169 Type 2 diabetes mellitus with other specified complication: Secondary | ICD-10-CM | POA: Diagnosis not present

## 2021-08-06 DIAGNOSIS — E782 Mixed hyperlipidemia: Secondary | ICD-10-CM | POA: Diagnosis not present

## 2021-08-06 DIAGNOSIS — N183 Chronic kidney disease, stage 3 unspecified: Secondary | ICD-10-CM | POA: Diagnosis not present

## 2021-08-06 DIAGNOSIS — Z1331 Encounter for screening for depression: Secondary | ICD-10-CM | POA: Diagnosis not present

## 2021-08-06 DIAGNOSIS — C9110 Chronic lymphocytic leukemia of B-cell type not having achieved remission: Secondary | ICD-10-CM | POA: Diagnosis not present

## 2021-08-06 DIAGNOSIS — Z0000 Encounter for general adult medical examination without abnormal findings: Secondary | ICD-10-CM | POA: Diagnosis not present

## 2021-08-06 DIAGNOSIS — R972 Elevated prostate specific antigen [PSA]: Secondary | ICD-10-CM | POA: Diagnosis not present

## 2021-08-06 DIAGNOSIS — I2510 Atherosclerotic heart disease of native coronary artery without angina pectoris: Secondary | ICD-10-CM | POA: Diagnosis not present

## 2021-08-19 DIAGNOSIS — I2510 Atherosclerotic heart disease of native coronary artery without angina pectoris: Secondary | ICD-10-CM | POA: Diagnosis not present

## 2021-08-19 DIAGNOSIS — E785 Hyperlipidemia, unspecified: Secondary | ICD-10-CM | POA: Diagnosis not present

## 2021-08-19 DIAGNOSIS — I5189 Other ill-defined heart diseases: Secondary | ICD-10-CM | POA: Diagnosis not present

## 2021-08-19 DIAGNOSIS — Z952 Presence of prosthetic heart valve: Secondary | ICD-10-CM | POA: Diagnosis not present

## 2021-08-19 DIAGNOSIS — N1832 Chronic kidney disease, stage 3b: Secondary | ICD-10-CM | POA: Diagnosis not present

## 2021-08-19 DIAGNOSIS — I10 Essential (primary) hypertension: Secondary | ICD-10-CM | POA: Diagnosis not present

## 2021-08-19 DIAGNOSIS — C9110 Chronic lymphocytic leukemia of B-cell type not having achieved remission: Secondary | ICD-10-CM | POA: Diagnosis not present

## 2021-08-19 DIAGNOSIS — I129 Hypertensive chronic kidney disease with stage 1 through stage 4 chronic kidney disease, or unspecified chronic kidney disease: Secondary | ICD-10-CM | POA: Diagnosis not present

## 2021-08-19 DIAGNOSIS — G5600 Carpal tunnel syndrome, unspecified upper limb: Secondary | ICD-10-CM | POA: Diagnosis not present

## 2021-08-19 DIAGNOSIS — E1129 Type 2 diabetes mellitus with other diabetic kidney complication: Secondary | ICD-10-CM | POA: Diagnosis not present

## 2021-08-19 DIAGNOSIS — N401 Enlarged prostate with lower urinary tract symptoms: Secondary | ICD-10-CM | POA: Diagnosis not present

## 2021-08-22 DIAGNOSIS — Z1212 Encounter for screening for malignant neoplasm of rectum: Secondary | ICD-10-CM | POA: Diagnosis not present

## 2021-08-22 DIAGNOSIS — Z1211 Encounter for screening for malignant neoplasm of colon: Secondary | ICD-10-CM | POA: Diagnosis not present

## 2021-08-30 LAB — COLOGUARD: COLOGUARD: NEGATIVE

## 2021-09-02 DIAGNOSIS — R972 Elevated prostate specific antigen [PSA]: Secondary | ICD-10-CM | POA: Diagnosis not present

## 2021-09-12 DIAGNOSIS — R972 Elevated prostate specific antigen [PSA]: Secondary | ICD-10-CM | POA: Diagnosis not present

## 2021-09-23 DIAGNOSIS — Z23 Encounter for immunization: Secondary | ICD-10-CM | POA: Diagnosis not present

## 2021-09-25 DIAGNOSIS — Z23 Encounter for immunization: Secondary | ICD-10-CM | POA: Diagnosis not present

## 2021-10-30 DIAGNOSIS — Z961 Presence of intraocular lens: Secondary | ICD-10-CM | POA: Diagnosis not present

## 2021-10-30 DIAGNOSIS — H353131 Nonexudative age-related macular degeneration, bilateral, early dry stage: Secondary | ICD-10-CM | POA: Diagnosis not present

## 2021-10-30 DIAGNOSIS — E119 Type 2 diabetes mellitus without complications: Secondary | ICD-10-CM | POA: Diagnosis not present

## 2021-10-30 DIAGNOSIS — Z7984 Long term (current) use of oral hypoglycemic drugs: Secondary | ICD-10-CM | POA: Diagnosis not present

## 2021-10-30 DIAGNOSIS — H524 Presbyopia: Secondary | ICD-10-CM | POA: Diagnosis not present

## 2021-11-08 ENCOUNTER — Other Ambulatory Visit: Payer: Self-pay

## 2021-11-08 DIAGNOSIS — C9110 Chronic lymphocytic leukemia of B-cell type not having achieved remission: Secondary | ICD-10-CM

## 2021-11-11 ENCOUNTER — Inpatient Hospital Stay: Payer: Medicare Other

## 2021-11-11 ENCOUNTER — Inpatient Hospital Stay: Payer: Medicare Other | Attending: Hematology | Admitting: Hematology

## 2021-11-11 VITALS — BP 136/60 | HR 72 | Temp 98.1°F | Resp 15 | Wt 183.5 lb

## 2021-11-11 DIAGNOSIS — Z7982 Long term (current) use of aspirin: Secondary | ICD-10-CM | POA: Insufficient documentation

## 2021-11-11 DIAGNOSIS — Z87891 Personal history of nicotine dependence: Secondary | ICD-10-CM | POA: Diagnosis not present

## 2021-11-11 DIAGNOSIS — Z79899 Other long term (current) drug therapy: Secondary | ICD-10-CM | POA: Insufficient documentation

## 2021-11-11 DIAGNOSIS — C9110 Chronic lymphocytic leukemia of B-cell type not having achieved remission: Secondary | ICD-10-CM | POA: Insufficient documentation

## 2021-11-11 LAB — CBC WITH DIFFERENTIAL (CANCER CENTER ONLY)
Abs Immature Granulocytes: 0.06 10*3/uL (ref 0.00–0.07)
Basophils Absolute: 0.1 10*3/uL (ref 0.0–0.1)
Basophils Relative: 0 %
Eosinophils Absolute: 0.2 10*3/uL (ref 0.0–0.5)
Eosinophils Relative: 1 %
HCT: 36.9 % — ABNORMAL LOW (ref 39.0–52.0)
Hemoglobin: 12.4 g/dL — ABNORMAL LOW (ref 13.0–17.0)
Immature Granulocytes: 0 %
Lymphocytes Relative: 70 %
Lymphs Abs: 17.1 10*3/uL — ABNORMAL HIGH (ref 0.7–4.0)
MCH: 31.2 pg (ref 26.0–34.0)
MCHC: 33.6 g/dL (ref 30.0–36.0)
MCV: 92.9 fL (ref 80.0–100.0)
Monocytes Absolute: 0.8 10*3/uL (ref 0.1–1.0)
Monocytes Relative: 3 %
Neutro Abs: 6.3 10*3/uL (ref 1.7–7.7)
Neutrophils Relative %: 26 %
Platelet Count: 212 10*3/uL (ref 150–400)
RBC: 3.97 MIL/uL — ABNORMAL LOW (ref 4.22–5.81)
RDW: 13.4 % (ref 11.5–15.5)
Smear Review: NORMAL
WBC Count: 24.7 10*3/uL — ABNORMAL HIGH (ref 4.0–10.5)
nRBC: 0 % (ref 0.0–0.2)

## 2021-11-11 LAB — CMP (CANCER CENTER ONLY)
ALT: 15 U/L (ref 0–44)
AST: 19 U/L (ref 15–41)
Albumin: 4.4 g/dL (ref 3.5–5.0)
Alkaline Phosphatase: 66 U/L (ref 38–126)
Anion gap: 7 (ref 5–15)
BUN: 27 mg/dL — ABNORMAL HIGH (ref 8–23)
CO2: 25 mmol/L (ref 22–32)
Calcium: 9.6 mg/dL (ref 8.9–10.3)
Chloride: 106 mmol/L (ref 98–111)
Creatinine: 1.56 mg/dL — ABNORMAL HIGH (ref 0.61–1.24)
GFR, Estimated: 44 mL/min — ABNORMAL LOW (ref >60)
Glucose, Bld: 144 mg/dL — ABNORMAL HIGH (ref 70–99)
Potassium: 4.6 mmol/L (ref 3.5–5.1)
Sodium: 138 mmol/L (ref 135–145)
Total Bilirubin: 0.4 mg/dL (ref 0.3–1.2)
Total Protein: 7.5 g/dL (ref 6.5–8.1)

## 2021-11-11 LAB — LACTATE DEHYDROGENASE: LDH: 233 U/L — ABNORMAL HIGH (ref 98–192)

## 2021-11-11 MED ORDER — OMEPRAZOLE 20 MG PO CPDR: 20.0000 mg | DELAYED_RELEASE_CAPSULE | ORAL | Status: AC

## 2021-11-11 NOTE — Progress Notes (Signed)
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  HISTORY OF PRESENTING ILLNESS: 02/2009  Barry Zavala is a wonderful 81 y.o. male who has been referred to [place] by Dr Josetta Huddle for evaluation and management of leukocytosis (possible lymphoma). We are joined by his wife, Barry Zavala, via phone. The pt reports that he is doing well overall.  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 He went to see his Dermatologist, in the summer, who prescribed 5% Betamethasone Dipropionate. He has been having to use this cream over a large portion of his body. His rash has improved but has not gone away.    He received his first dose of the COVID19 vaccine on 01/14/2019. His WBC was around 17K at this time. He saw his Cardiologist, Dr. Tamala Julian, on 02/02/2019 and found that his WBC had decreased to 13K. He received the second dose of the COVID19 vaccine on 02/04/2019.   He has not felt any different in the last 3-6 months. His other chronic conditions are currently being well managed. Pt golfs 4-5 days per week if the weather allows. He has been on Jardiance for some time and has not needed to be on steroids for any reason within the last 6 months. Pt was a previous smoker and quit in 1988. He does not drink any alcohol. Pt is a retired Engineer, technical sales. Pt has had some previous dental issues that were corrected by a Periodontist. He denies any current issues with his gums. Pt has never needed a blood transfusion and has not had any tattoos. He has benign prostatitis and there have been recent elevations in PSA levels. His sister passed from Multiple Myeloma  but he is not aware of any other blood disorder or cancers.   Most recent lab results (02/02/2019) of CBC and BMP is as follows: all values are WNL except for WBC at 13.9K, RBC at 4.05, Hgb at 12.5, HCT at 38.1  On review of systems, pt denies fevers, chills, night sweats, unexpected weight loss, abdominal pain, chest pain, SOB, new lumps/bumps, sore throat, rhinorrhea, dysuria, hematuria, difficulty starting urine stream, forcing urine stream and any other symptoms.   On PMHx the pt reports Benign Prostatitis, CAD, Elevated PSA, GERD, HLD, HTN, Type 2 Diabetes. On Social Hx the pt reports that he is a previous smoker (quit in 1988), does not drink any alcohol. He is a retired Chief Executive Officer. On Family Hx the pt reports that he had a sister who passed from Multiple Myeloma  INTERVAL HISTORY:  Barry Zavala is here for continued valuation and management of his CLL.  He was last seen by me on 05/06/2021 and was doing well overall.   He reports he is doing well without any new medical concerns during today's visit.   He denies fevers, chills, lumps/bumps, skin rashes, night sweats, and abdominal pain.  He complains of dry skin on his skull and reports that his dermatologist recently retired and he used to regularly see his dermatologist. He has been scheduled to see a dermatologist in January of 2024.   He has discontinued Plavix 75 mg, Protonix 40 mg, and Onglyza 5 mg. He has started Omeprazole 20 mg, twice weekly.  He sees his PCP once a year, around [place].    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 Angelena Form and Dr. 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   bridge of nose (cx23f)   Type 2 diabetes mellitus ([facility]     SURGICAL HISTORY: Past Surgical History:  Procedure Laterality Date   APPENDECTOMY     CARDIAC CATHETERIZATION  2021   cardiac stents     CARPAL TUNNEL RELEASE Left 12/09/2016   Procedure: CARPAL TUNNEL RELEASE;  Surgeon: KDaryll Brod MD;  [place]  Service: Orthopedics;  Laterality: Left;   CARPAL TUNNEL WITH CUBITAL TUNNEL Right 01/13/2017   Procedure: RIGHT CARPAL TUNNEL WITH CUBITAL TUNNEL RELEASE;  Surgeon: KDaryll Brod MD;  [place]  Service: Orthopedics;  Laterality: Right;   EYE SURGERY Bilateral 2019   Dr. SGershon Crane  INTRAVASCULAR PRESSURE WIRE/FFR STUDY N/A 11/15/2019   Procedure: INTRAVASCULAR PRESSURE WIRE/FFR STUDY;  Surgeon: MBurnell Blanks MD;  [place] [facility];  Service: Cardiovascular;  Laterality: N/A;   RIGHT/LEFT HEART CATH AND CORONARY ANGIOGRAPHY N/A 02/02/2019   Procedure: RIGHT/LEFT HEART CATH AND CORONARY ANGIOGRAPHY;  Surgeon: SBelva Crome MD;  [facility];  Service: Cardiovascular;  Laterality: N/A;   RIGHT/LEFT HEART CATH AND CORONARY ANGIOGRAPHY N/A 11/15/2019   Procedure: RIGHT/LEFT HEART CATH AND CORONARY ANGIOGRAPHY;  Surgeon: MBurnell Blanks MD;  [place] [facility];  Service: Cardiovascular;  Laterality: N/A;   TEE WITHOUT CARDIOVERSION N/A 12/13/2019   Procedure: TRANSESOPHAGEAL ECHOCARDIOGRAM (TEE);  Surgeon: MBurnell Blanks MD;  [place]  Service: Open Heart Surgery;  Laterality: N/A;   TRANSCATHETER AORTIC VALVE  REPLACEMENT, TRANSFEMORAL  12/13/2019   TRANSCATHETER AORTIC VALVE REPLACEMENT, TRANSFEMORAL N/A 12/13/2019   Procedure: TRANSCATHETER AORTIC VALVE REPLACEMENT, TRANSFEMORAL;  Surgeon: MBurnell Blanks MD;  [place] [facility]  Service: Open Heart Surgery;  Laterality: N/A;   TRIGGER FINGER RELEASE Left 08/10/2018   Procedure: RELEASE TRIGGER FINGER/A-1 PULLEY THUMB AND LEFT SMALL;  Surgeon: KDaryll Brod MD;  [place]  Service: Orthopedics;  Laterality: Left;  FAB   ULNAR NERVE TRANSPOSITION Left 12/09/2016   Procedure: DECOMPRESSION LEFT ULNAR NERVE;  Surgeon: KDaryll Brod MD;  [place]  Service: Orthopedics;  Laterality: Left;   ULNAR NERVE TRANSPOSITION Right 01/13/2017   Procedure: DECOMPRESSION ULNAR NERVE;  Surgeon: KDaryll Brod MD;  [place] [facility]  Service: Orthopedics;  Laterality: Right;    SOCIAL HISTORY: Social History   Socioeconomic History   Marital status: Married    Spouse name: Not on file   Number of children: 2  Years of education: Not on file   Highest education level: Not on file  Occupational History   Occupation: Retired Chief Executive Officer  Tobacco Use   Smoking status: Former    Packs/day: 1.00    Years: 20.00    Total pack years: 20.00    Types: Cigarettes    Quit date: 1988    Years since quitting: 35.8   Smokeless tobacco: Never  Vaping Use   Vaping Use: Never used  Substance and Sexual Activity   Alcohol use: Yes    Alcohol/week: 0.0 standard drinks of alcohol    Comment: social   Drug use: No   Sexual activity: Yes  Other Topics Concern   Not on file  Social History Narrative   Not on file   Social Determinants of Health   Financial Resource Strain: Not on file  Food Insecurity: Not on file  Transportation Needs: Not on file  Physical Activity: Not on file  Stress: Not on file  Social Connections: Not on file  Intimate Partner Violence: Not on file    FAMILY  HISTORY: Family History  Problem Relation Age of Onset   Ovarian cancer Mother 67   Heart failure Father    Cancer Paternal Uncle        Karposi Sarcoma   Multiple myeloma Sister 60    ALLERGIES:  has No Known Allergies.  MEDICATIONS:  Current Outpatient Medications  Medication Sig Dispense Refill   ACTOS 45 MG tablet Take 45 mg by mouth daily with supper.   1   amoxicillin (AMOXIL) 500 MG tablet Take 4 tablets (2,000 mg total) one hour prior to all dental visits. 24 tablet 2   aspirin EC 81 MG tablet Take 1 tablet (81 mg total) by mouth daily. 90 tablet 3   Cholecalciferol (VITAMIN D3 SUPER STRENGTH) 50 MCG (2000 UT) TABS Take 2,000 Units by mouth daily.     clopidogrel (PLAVIX) 75 MG tablet TAKE 1 TABLET EVERY DAY WITH BREAKFAST. 90 tablet 3   Dulaglutide (TRULICITY) 6.04 VW/0.9WJ SOPN      empagliflozin (JARDIANCE) 10 MG TABS tablet Take 10 mg by mouth daily.      ezetimibe-simvastatin (VYTORIN) 10-20 MG tablet TAKE ONE TABLET BY MOUTH DAILY 90 tablet 3   Glucosamine-Chondroitin (GLUCOSAMINE CHONDR COMPLEX PO) Take 1 capsule by mouth 2 (two) times daily.     glyBURIDE-metformin (GLUCOVANCE) 5-500 MG per tablet Take 2 tablets by mouth 2 (two) times daily with a meal.     metoprolol succinate (TOPROL-XL) 50 MG 24 hr tablet Take 50 mg by mouth at bedtime. Take with or immediately following a meal.     Multiple Vitamin (MULTIVITAMIN WITH MINERALS) TABS tablet Take 1 tablet by mouth daily. Centrum silver     omega-3 acid ethyl esters (LOVAZA) 1 G capsule Take 1 g by mouth in the morning, at noon, and at bedtime.      ONGLYZA 5 MG TABS tablet Take 5 mg by mouth daily with supper.  2   pantoprazole (PROTONIX) 40 MG tablet TAKE 1 TABLET ONCE DAILY. 60 tablet 0   ramipril (ALTACE) 10 MG capsule Take 10 mg by mouth daily with supper.      No current facility-administered medications for this visit.    REVIEW OF SYSTEMS:   10 Point review of Systems was done is negative except as noted  above.  PHYSICAL EXAMINATION: ECOG PERFORMANCE STATUS: 1 - Symptomatic but completely ambulatory ..BP 136/60 (BP Location: Left Arm, Patient Position: Sitting)  Pulse 72   Temp 98.1 F (36.7 C)   Resp 15   Wt 183 lb 8 oz (83.2 kg)   SpO2 96%   BMI 25.59 kg/m   GENERAL:alert, in no acute distress and comfortable SKIN: no acute rashes, no significant lesions EYES: conjunctiva are pink and non-injected, sclera anicteric OROPHARYNX: MMM NECK: supple, no JVD LYMPH:  no palpable lymphadenopathy in the cervical, axillary or inguinal regions LUNGS: clear to auscultation b/l with normal respiratory effort HEART: regular rate & rhythm ABDOMEN:  normoactive bowel sounds , non tender, not distended. Extremity: no pedal edema PSYCH: alert & oriented x 3 with fluent speech NEURO: no focal motor/sensory deficits   LABORATORY DATA:  I have reviewed the data as listed  .    Latest Ref Rng & Units 11/11/2021   12:24 PM 12/14/2019    1:50 AM 12/13/2019   10:35 AM  CBC  WBC 4.0 - 10.5 K/uL 24.7  14.6    Hemoglobin 13.0 - 17.0 g/dL 12.4  10.5  11.2   Hematocrit 39.0 - 52.0 % 36.9  30.1  33.0   Platelets 150 - 400 K/uL 212  170     .    Latest Ref Rng & Units 11/11/2021   12:24 PM 12/14/2019    1:50 AM 12/13/2019   10:35 AM  CMP  Glucose 70 - 99 mg/dL 144  250  256   BUN 8 - 23 mg/dL 27  29  39   Creatinine 0.61 - 1.24 mg/dL 1.56  1.49  1.40   Sodium 135 - 145 mmol/L 138  137  140   Potassium 3.5 - 5.1 mmol/L 4.6  4.0  5.0   Chloride 98 - 111 mmol/L 106  108  107   CO2 22 - 32 mmol/L 25  21    Calcium 8.9 - 10.3 mg/dL 9.6  8.3    Total Protein 6.5 - 8.1 g/dL 7.5     Total Bilirubin 0.3 - 1.2 mg/dL 0.4     Alkaline Phos 38 - 126 U/L 66     AST 15 - 41 U/L 19     ALT 0 - 44 U/L 15      . Lab Results  Component Value Date   LDH 233 (H) 11/11/2021       02/08/2019 Flow Pathology:     WBC  Lymphs Abs   12/05/2015 10.1K 3.5K  12/10/2017 12.0K 7.0K  12/15/2018 14.9K 8.0K    RADIOGRAPHIC STUDIES: I have personally reviewed the radiological images as listed and agreed with the findings in the report. No results found.   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 Labs showed WBC count of 24.7K, Hemoglobin of 12.4K, and platelet counts of 212. CMP stable.  -Notes no clinical lymphadenopathy or splenomegaly. -patient has no lab or clinical evidence of significant CLL progression at this time. -no indication to initial CLL directed treatment at this time.  FOLLOW UP: RTC with Dr Irene Limbo with labs in 6 months  The total time spent in the appointment was 20 minutes* .  All of the patient's questions were answered with apparent satisfaction. The patient knows to call the clinic with any problems, questions or concerns.   Zettie Cooley, am acting as a scribe for Sullivan Lone, MD.  Sullivan Lone MD Standard AAHIVMS [facility] Hematology/Oncology Physician [facility]  [place]  .*Total Encounter Time as defined by the Centers for Medicare and Medicaid Services includes, in addition to the face-to-face time of a patient visit (documented in the note above) non-face-to-face time: obtaining and reviewing outside history, ordering and reviewing medications, tests or procedures, care coordination (communications with other health care professionals or caregivers) and documentation in the medical record.

## 2021-11-18 ENCOUNTER — Other Ambulatory Visit: Payer: Self-pay | Admitting: Interventional Cardiology

## 2021-11-19 ENCOUNTER — Telehealth: Payer: Self-pay | Admitting: Physician Assistant

## 2021-11-19 NOTE — Telephone Encounter (Signed)
Patient called very upset that he was not notified he was due for an appointment with Dr. Tamala Julian. He states he only found out when he needed a refill. He also says he was not informed Dr. Tamala Julian was retiring and refused to schedule an appointment with an APP. Patient then requested to speak directly with Bonney Leitz.

## 2021-11-19 NOTE — Telephone Encounter (Signed)
Returned call to patient.  Patient states he was not notified that he was overdue for a follow-up with Dr. Tamala Julian nor that Dr. Tamala Julian was retiring in January. Patient states this was very upsetting to learn after picking up his refill from the pharmacy.  Apologized to patient that this happened this way. Appt with Dr. Tamala Julian made for 12/11/21 at 9:30am, and ensured patient that a 90-day supply with 3 refills can be sent in when he comes for his appt on that date.  Patient verbalized understanding and expressed appreciation for assistance.

## 2021-12-10 NOTE — Progress Notes (Unsigned)
Cardiology Office Note:    Date:  12/11/2021   ID:  Barry Zavala, DOB 25-Oct-1940, MRN [id number]  PCP:  Josetta Huddle, MD  Cardiologist:  Sinclair Grooms, MD   Referring MD: Josetta Huddle, MD   No chief complaint on file.   History of Present Illness:    Barry Zavala is a 81 y.o. 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  No complaints.  He is very gracious in the appointment today bringing me several bottles of expensive bourbon as a gift of gratitude for care given.  He is not having angina, orthopnea, PND, syncope, orthopnea, lower extremity swelling, or any cardiac complaint.  He had to play golf today.  He prefers to have Dr. Lauree Chandler as his cardiologist going forward.  Dr. Angelena Form did his TAVR.  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 Angelena Form and Dr. 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   bridge of nose ([id number])   [place] 2 diabetes mellitus ([place]     Past Surgical History:  Procedure Laterality Date   APPENDECTOMY     CARDIAC CATHETERIZATION  2021   cardiac stents     CARPAL TUNNEL RELEASE Left 12/09/2016   Procedure: CARPAL TUNNEL RELEASE;  Surgeon: KDaryll Brod MD;  [place]  Service: Orthopedics;  Laterality: Left;   CARPAL TUNNEL WITH CUBITAL TUNNEL Right 01/13/2017   Procedure: RIGHT CARPAL TUNNEL WITH CUBITAL TUNNEL RELEASE;  Surgeon: KDaryll Brod MD;  [place]  Service: Orthopedics;  Laterality: Right;   EYE SURGERY Bilateral 2019   Dr. SGershon Crane  INTRAVASCULAR PRESSURE WIRE/FFR STUDY N/A 11/15/2019   Procedure: INTRAVASCULAR PRESSURE WIRE/FFR STUDY;  Surgeon: MBurnell Blanks MD;  [facility];  Service: Cardiovascular;  Laterality: N/A;   RIGHT/LEFT HEART CATH AND CORONARY ANGIOGRAPHY N/A 02/02/2019   Procedure: RIGHT/LEFT HEART CATH AND CORONARY ANGIOGRAPHY;  Surgeon: SBelva Crome MD;  [place] [facility];  Service: Cardiovascular;  Laterality: N/A;   RIGHT/LEFT HEART CATH AND CORONARY ANGIOGRAPHY N/A 11/15/2019   Procedure: RIGHT/LEFT HEART CATH AND CORONARY ANGIOGRAPHY;  Surgeon: MBurnell Blanks MD;  [place] [facility];  Service: Cardiovascular;  Laterality: N/A;   TEE WITHOUT CARDIOVERSION N/A 12/13/2019   Procedure: TRANSESOPHAGEAL ECHOCARDIOGRAM (TEE);  Surgeon: MBurnell Blanks MD;  [place]  Service: Open Heart Surgery;  Laterality: N/A;   TRANSCATHETER AORTIC VALVE REPLACEMENT, TRANSFEMORAL  12/13/2019   TRANSCATHETER AORTIC VALVE REPLACEMENT, TRANSFEMORAL N/A 12/13/2019   Procedure: TRANSCATHETER AORTIC VALVE REPLACEMENT, TRANSFEMORAL;  Surgeon:  Burnell Blanks, MD;  Location: [place];  Service: Open Heart Surgery;  Laterality: N/A;   TRIGGER FINGER RELEASE Left 08/10/2018   Procedure: RELEASE TRIGGER FINGER/A-1 PULLEY THUMB AND LEFT SMALL;  Surgeon: Daryll Brod, MD;  Location: [place];  Service: Orthopedics;  Laterality: Left;  FAB   ULNAR NERVE TRANSPOSITION Left 12/09/2016   Procedure: DECOMPRESSION LEFT ULNAR NERVE;  Surgeon: Daryll Brod, MD;  Location: [place];  Service: Orthopedics;  Laterality: Left;   ULNAR  NERVE TRANSPOSITION Right 01/13/2017   Procedure: DECOMPRESSION ULNAR NERVE;  Surgeon: Daryll Brod, MD;  Location: [place];  Service: Orthopedics;  Laterality: Right;    Current Medications: Current Meds  Medication Sig   ACTOS 45 MG tablet Take 45 mg by mouth daily with supper.    amoxicillin (AMOXIL) 500 MG tablet Take 4 tablets (2,000 mg total) one hour prior to all dental visits.   aspirin EC 81 MG tablet Take 1 tablet (81 mg total) by mouth daily.   Cholecalciferol (VITAMIN D3 SUPER STRENGTH) 50 MCG (2000 UT) TABS Take 2,000 Units by mouth daily.   Dulaglutide (TRULICITY) 1.85 UD/1.4HF SOPN    empagliflozin (JARDIANCE) 10 MG TABS tablet Take 10 mg by mouth daily.    ezetimibe-simvastatin (VYTORIN) 10-20 MG tablet Take 1 tablet by mouth daily. Please call to schedule an overdue appointment with Dr. Tamala Julian for refills, [phone], thank you. 1st attempt.   Glucosamine-Chondroitin (GLUCOSAMINE CHONDR COMPLEX PO) Take 1 capsule by mouth 2 (two) times daily. 500 mgs   glyBURIDE-metformin (GLUCOVANCE) 5-500 MG per tablet Take 2 tablets by mouth 2 (two) times daily with a meal.   metoprolol succinate (TOPROL-XL) 50 MG 24 hr tablet Take 50 mg by mouth at bedtime. Take with or immediately following a meal.   Multiple Vitamin (MULTIVITAMIN WITH MINERALS) TABS tablet Take 1 tablet by mouth daily. Centrum silver   omega-3 acid ethyl esters (LOVAZA) 1 G capsule Take 1 g by mouth in the morning, at noon, and at bedtime.    omeprazole (PRILOSEC) 20 MG capsule Take 1 capsule (20 mg total) by mouth 2 (two) times a week.   ramipril (ALTACE) 10 MG capsule Take 10 mg by mouth daily with supper.      Allergies:   Patient has no known allergies.   Social History   Socioeconomic History   Marital status: Married    Spouse name: Not on file   Number of children: 2   Years of education: Not on file   Highest education level: Not on file  Occupational History   Occupation: Retired  Chief Executive Officer  Tobacco Use   Smoking status: Former    Packs/day: 1.00    Years: 20.00    Total pack years: 20.00    Types: Cigarettes    Quit date: 1988    Years since quitting: 35.9   Smokeless tobacco: Never  Vaping Use   Vaping Use: Never used  Substance and Sexual Activity   Alcohol use: Yes    Alcohol/week: 0.0 standard drinks of alcohol    Comment: social   Drug use: No   Sexual activity: Yes  Other Topics Concern   Not on file  Social History Narrative   Not on file   Social Determinants of Health   Financial Resource Strain: Not on file  Food Insecurity: Not on file  Transportation Needs: Not on file  Physical Activity: Not on file  Stress: Not on file  Social Connections: Not  on file     Family History: The patient's family history includes Cancer in his paternal uncle; Heart failure in his father; Multiple myeloma (age of onset: 46) in his sister; Ovarian cancer (age of onset: 77) in his mother.  ROS:   Please see the history of present illness.    No specific complaints all other systems reviewed and are negative.  EKGs/Labs/Other Studies Reviewed:    The following studies were reviewed today: Last echocardiogram was a year ago.  Will let Dr. Angelena Form determine when another study needs to be performed.  EKG:  EKG normal sinus rhythm, right bundle branch block, left anterior hemiblock.  When compared to the tracing performed 11/12/2020 except now there is left axis deviation.   Carotid Doppler 2021: Summary:  Right Carotid: Velocities in the right ICA are consistent with a 1-39%  stenosis.   Left Carotid: Velocities in the left ICA are consistent with a 1-39%  stenosis.   Vertebrals: Bilateral vertebral arteries demonstrate antegrade flow.  Subclavians: Normal flow hemodynamics were seen in bilateral subclavian               arteries.  Recent Labs: 11/11/2021: ALT 15; BUN 27; Creatinine 1.56; Hemoglobin 12.4; Platelet Count 212; Potassium 4.6; Sodium 138   Recent Lipid Panel    Component Value Date/Time   CHOL 83 (L) 01/24/2015 0949   TRIG 49 01/24/2015 0949   HDL 38 (L) 01/24/2015 0949   CHOLHDL 2.2 01/24/2015 0949   VLDL 10 01/24/2015 0949   LDLCALC 35 01/24/2015 0949    Physical Exam:    VS:  BP 120/60   Pulse 76   Ht _0  (1.803 m)   Wt 185 lb 9.6 oz (84.2 kg)   SpO2 96%   BMI 25.89 kg/m     Wt Readings from Last 3 Encounters:  12/11/21 185 lb 9.6 oz (84.2 kg)  11/11/21 183 lb 8 oz (83.2 kg)  05/06/21 183 lb 12.8 oz (83.4 kg)     GEN: Healthy appearing younger than stated age.. No acute distress HEENT: Normal NECK: No JVD.  Mild left supraclavicular and cervical bruit.  Could be transmitted from the subclavian. LYMPHATICS: No lymphadenopathy CARDIAC: Faint systolic without diastolic murmur. RRR no gallop, or edema. VASCULAR:  Normal Pulses. No bruits. RESPIRATORY:  Clear to auscultation without rales, wheezing or rhonchi  ABDOMEN: Soft, non-tender, non-distended, No pulsatile mass, MUSCULOSKELETAL: No deformity  SKIN: Warm and dry NEUROLOGIC:  Alert and oriented x 3 PSYCHIATRIC:  Normal affect   ASSESSMENT:    1. S/P TAVR (transcatheter aortic valve replacement)   2. CAD S/P percutaneous coronary angioplasty   3. Stage 3 chronic kidney disease, unspecified whether stage 3a or 3b CKD ([place])   4. Mixed hyperlipidemia   5. Essential hypertension   6. Right bundle branch block   7. Bilateral carotid bruits    PLAN:    In order of problems listed above:  No recent echo.  Auscultation is unremarkable.  Will defer any echo at this time and leave that to the discretion of Dr. Angelena Form. No episodes of angina.  Secondary prevention discussed. Being monitored by primary care. Continue high intensity lipid-lowering with Vytorin 10/20 mg.  LDL target less than 70. Excellent blood pressure control on Toprol-XL and Altace. Also now has left axis deviation.  Needs to be followed especially given his known TAVR valve.   No symptoms to suggest bradycardia Left subclavicular and supraclavicular bruit.  Bilateral carotid Doppler study is ordered.  Overall education and awareness concerning primary/secondary risk prevention was discussed in detail: LDL less than 70, hemoglobin A1c less than 7, blood pressure target less than 130/80 mmHg, >150 minutes of moderate aerobic activity per week, avoidance of smoking, weight control (via diet and exercise), and continued surveillance/management of/for obstructive sleep apnea.   Medication Adjustments/Labs and Tests Ordered: Current medicines are reviewed at length with the patient today.  Concerns regarding medicines are outlined above.  Orders Placed This Encounter  Procedures   EKG 12-Lead   VAS US CAROTID   No orders of the defined types were placed in this encounter.   Patient Instructions  Medication Instructions:  Your physician recommends that you continue on your current medications as directed. Please refer to the Current Medication list given to you today.  *If you need a refill on your cardiac medications before your next appointment, please call your pharmacy*  Follow-Up: At [facility], you and your health needs are our priority.  As part of our continuing mission to provide you with exceptional heart care, we have created designated Provider Care Teams.  These Care Teams include your primary Cardiologist (physician) and Advanced Practice Providers (APPs -  Physician Assistants and Nurse Practitioners) who all work together to provide you with the care you need, when you need it.  Your next appointment:   6 month(s)  The format for your next appointment:   In Person  Provider:   Lauree Chandler, MD  Important Information About Sugar         Signed, Sinclair Grooms, MD  12/11/2021 10:22 AM    Between

## 2021-12-11 ENCOUNTER — Encounter: Payer: Self-pay | Admitting: Interventional Cardiology

## 2021-12-11 ENCOUNTER — Ambulatory Visit: Payer: Medicare Other | Attending: Interventional Cardiology | Admitting: Interventional Cardiology

## 2021-12-11 VITALS — BP 120/60 | HR 76 | Ht 71.0 in | Wt 185.6 lb

## 2021-12-11 DIAGNOSIS — I10 Essential (primary) hypertension: Secondary | ICD-10-CM | POA: Insufficient documentation

## 2021-12-11 DIAGNOSIS — Z9861 Coronary angioplasty status: Secondary | ICD-10-CM | POA: Insufficient documentation

## 2021-12-11 DIAGNOSIS — E782 Mixed hyperlipidemia: Secondary | ICD-10-CM | POA: Insufficient documentation

## 2021-12-11 DIAGNOSIS — N183 Chronic kidney disease, stage 3 unspecified: Secondary | ICD-10-CM | POA: Diagnosis not present

## 2021-12-11 DIAGNOSIS — Z952 Presence of prosthetic heart valve: Secondary | ICD-10-CM | POA: Diagnosis not present

## 2021-12-11 DIAGNOSIS — I4510 Unspecified right bundle-branch block: Secondary | ICD-10-CM | POA: Diagnosis not present

## 2021-12-11 DIAGNOSIS — R0989 Other specified symptoms and signs involving the circulatory and respiratory systems: Secondary | ICD-10-CM | POA: Insufficient documentation

## 2021-12-11 DIAGNOSIS — I2510 Atherosclerotic heart disease of native coronary artery without angina pectoris: Secondary | ICD-10-CM | POA: Insufficient documentation

## 2021-12-11 NOTE — Patient Instructions (Signed)
Medication Instructions:  Your physician recommends that you continue on your current medications as directed. Please refer to the Current Medication list given to you today.  *If you need a refill on your cardiac medications before your next appointment, please call your pharmacy*  Follow-Up: At [facility], you and your health needs are our priority.  As part of our continuing mission to provide you with exceptional heart care, we have created designated Provider Care Teams.  These Care Teams include your primary Cardiologist (physician) and Advanced Practice Providers (APPs -  Physician Assistants and Nurse Practitioners) who all work together to provide you with the care you need, when you need it.  Your next appointment:   6 month(s)  The format for your next appointment:   In Person  Provider:   Lauree Chandler, MD  Important Information About Sugar

## 2021-12-13 ENCOUNTER — Ambulatory Visit (HOSPITAL_COMMUNITY)
Admission: RE | Admit: 2021-12-13 | Discharge: 2021-12-13 | Disposition: A | Discharge status: Home / Self Care | Payer: Medicare Other | Source: Ambulatory Visit | Attending: Interventional Cardiology | Admitting: Interventional Cardiology

## 2021-12-13 DIAGNOSIS — R051 Acute cough: Secondary | ICD-10-CM | POA: Diagnosis not present

## 2021-12-13 DIAGNOSIS — R0989 Other specified symptoms and signs involving the circulatory and respiratory systems: Secondary | ICD-10-CM | POA: Insufficient documentation

## 2021-12-13 DIAGNOSIS — J029 Acute pharyngitis, unspecified: Secondary | ICD-10-CM | POA: Diagnosis not present

## 2021-12-18 DIAGNOSIS — E1129 Type 2 diabetes mellitus with other diabetic kidney complication: Secondary | ICD-10-CM | POA: Diagnosis not present

## 2021-12-18 DIAGNOSIS — N401 Enlarged prostate with lower urinary tract symptoms: Secondary | ICD-10-CM | POA: Diagnosis not present

## 2021-12-18 DIAGNOSIS — I6523 Occlusion and stenosis of bilateral carotid arteries: Secondary | ICD-10-CM | POA: Diagnosis not present

## 2021-12-18 DIAGNOSIS — Z952 Presence of prosthetic heart valve: Secondary | ICD-10-CM | POA: Diagnosis not present

## 2021-12-18 DIAGNOSIS — N1832 Chronic kidney disease, stage 3b: Secondary | ICD-10-CM | POA: Diagnosis not present

## 2021-12-18 DIAGNOSIS — I129 Hypertensive chronic kidney disease with stage 1 through stage 4 chronic kidney disease, or unspecified chronic kidney disease: Secondary | ICD-10-CM | POA: Diagnosis not present

## 2021-12-18 DIAGNOSIS — I5189 Other ill-defined heart diseases: Secondary | ICD-10-CM | POA: Diagnosis not present

## 2021-12-18 DIAGNOSIS — E785 Hyperlipidemia, unspecified: Secondary | ICD-10-CM | POA: Diagnosis not present

## 2021-12-18 DIAGNOSIS — C9110 Chronic lymphocytic leukemia of B-cell type not having achieved remission: Secondary | ICD-10-CM | POA: Diagnosis not present

## 2021-12-18 DIAGNOSIS — I700 Atherosclerosis of aorta: Secondary | ICD-10-CM | POA: Diagnosis not present

## 2021-12-18 DIAGNOSIS — I2510 Atherosclerotic heart disease of native coronary artery without angina pectoris: Secondary | ICD-10-CM | POA: Diagnosis not present

## 2021-12-18 DIAGNOSIS — R918 Other nonspecific abnormal finding of lung field: Secondary | ICD-10-CM | POA: Diagnosis not present

## 2022-02-10 DIAGNOSIS — I2510 Atherosclerotic heart disease of native coronary artery without angina pectoris: Secondary | ICD-10-CM | POA: Diagnosis not present

## 2022-02-10 DIAGNOSIS — C9110 Chronic lymphocytic leukemia of B-cell type not having achieved remission: Secondary | ICD-10-CM | POA: Diagnosis not present

## 2022-02-10 DIAGNOSIS — N183 Chronic kidney disease, stage 3 unspecified: Secondary | ICD-10-CM | POA: Diagnosis not present

## 2022-02-10 DIAGNOSIS — E782 Mixed hyperlipidemia: Secondary | ICD-10-CM | POA: Diagnosis not present

## 2022-02-10 DIAGNOSIS — R972 Elevated prostate specific antigen [PSA]: Secondary | ICD-10-CM | POA: Diagnosis not present

## 2022-02-10 DIAGNOSIS — E1169 Type 2 diabetes mellitus with other specified complication: Secondary | ICD-10-CM | POA: Diagnosis not present

## 2022-02-10 DIAGNOSIS — E559 Vitamin D deficiency, unspecified: Secondary | ICD-10-CM | POA: Diagnosis not present

## 2022-02-27 ENCOUNTER — Other Ambulatory Visit: Payer: Self-pay

## 2022-02-27 MED ORDER — EZETIMIBE-SIMVASTATIN 10-20 MG PO TABS: 1.0000 | ORAL_TABLET | Freq: Every day | ORAL | 3 refills | Status: DC

## 2022-03-05 DIAGNOSIS — Z85828 Personal history of other malignant neoplasm of skin: Secondary | ICD-10-CM | POA: Diagnosis not present

## 2022-03-05 DIAGNOSIS — D1801 Hemangioma of skin and subcutaneous tissue: Secondary | ICD-10-CM | POA: Diagnosis not present

## 2022-03-05 DIAGNOSIS — L821 Other seborrheic keratosis: Secondary | ICD-10-CM | POA: Diagnosis not present

## 2022-03-05 DIAGNOSIS — L814 Other melanin hyperpigmentation: Secondary | ICD-10-CM | POA: Diagnosis not present

## 2022-03-05 DIAGNOSIS — C44712 Basal cell carcinoma of skin of right lower limb, including hip: Secondary | ICD-10-CM | POA: Diagnosis not present

## 2022-03-05 DIAGNOSIS — L570 Actinic keratosis: Secondary | ICD-10-CM | POA: Diagnosis not present

## 2022-04-09 DIAGNOSIS — Z85828 Personal history of other malignant neoplasm of skin: Secondary | ICD-10-CM | POA: Diagnosis not present

## 2022-04-09 DIAGNOSIS — D692 Other nonthrombocytopenic purpura: Secondary | ICD-10-CM | POA: Diagnosis not present

## 2022-04-09 DIAGNOSIS — C44712 Basal cell carcinoma of skin of right lower limb, including hip: Secondary | ICD-10-CM | POA: Diagnosis not present

## 2022-04-24 DIAGNOSIS — E1129 Type 2 diabetes mellitus with other diabetic kidney complication: Secondary | ICD-10-CM | POA: Diagnosis not present

## 2022-04-24 DIAGNOSIS — R918 Other nonspecific abnormal finding of lung field: Secondary | ICD-10-CM | POA: Diagnosis not present

## 2022-04-24 DIAGNOSIS — G5600 Carpal tunnel syndrome, unspecified upper limb: Secondary | ICD-10-CM | POA: Diagnosis not present

## 2022-04-24 DIAGNOSIS — E785 Hyperlipidemia, unspecified: Secondary | ICD-10-CM | POA: Diagnosis not present

## 2022-04-24 DIAGNOSIS — I10 Essential (primary) hypertension: Secondary | ICD-10-CM | POA: Diagnosis not present

## 2022-04-24 DIAGNOSIS — N401 Enlarged prostate with lower urinary tract symptoms: Secondary | ICD-10-CM | POA: Diagnosis not present

## 2022-04-24 DIAGNOSIS — C9110 Chronic lymphocytic leukemia of B-cell type not having achieved remission: Secondary | ICD-10-CM | POA: Diagnosis not present

## 2022-04-24 DIAGNOSIS — I129 Hypertensive chronic kidney disease with stage 1 through stage 4 chronic kidney disease, or unspecified chronic kidney disease: Secondary | ICD-10-CM | POA: Diagnosis not present

## 2022-04-24 DIAGNOSIS — Z952 Presence of prosthetic heart valve: Secondary | ICD-10-CM | POA: Diagnosis not present

## 2022-04-24 DIAGNOSIS — N1832 Chronic kidney disease, stage 3b: Secondary | ICD-10-CM | POA: Diagnosis not present

## 2022-04-24 DIAGNOSIS — I700 Atherosclerosis of aorta: Secondary | ICD-10-CM | POA: Diagnosis not present

## 2022-04-24 DIAGNOSIS — I2510 Atherosclerotic heart disease of native coronary artery without angina pectoris: Secondary | ICD-10-CM | POA: Diagnosis not present

## 2022-05-09 ENCOUNTER — Other Ambulatory Visit: Payer: Self-pay

## 2022-05-09 DIAGNOSIS — C9110 Chronic lymphocytic leukemia of B-cell type not having achieved remission: Secondary | ICD-10-CM

## 2022-05-12 ENCOUNTER — Inpatient Hospital Stay (HOSPITAL_BASED_OUTPATIENT_CLINIC_OR_DEPARTMENT_OTHER): Payer: Medicare Other | Admitting: Hematology

## 2022-05-12 ENCOUNTER — Inpatient Hospital Stay: Payer: Medicare Other | Attending: Hematology

## 2022-05-12 VITALS — BP 141/58 | HR 66 | Temp 97.7°F | Resp 16 | Wt 182.1 lb

## 2022-05-12 DIAGNOSIS — Z79899 Other long term (current) drug therapy: Secondary | ICD-10-CM | POA: Insufficient documentation

## 2022-05-12 DIAGNOSIS — Z87891 Personal history of nicotine dependence: Secondary | ICD-10-CM | POA: Diagnosis not present

## 2022-05-12 DIAGNOSIS — Z7982 Long term (current) use of aspirin: Secondary | ICD-10-CM | POA: Insufficient documentation

## 2022-05-12 DIAGNOSIS — C9110 Chronic lymphocytic leukemia of B-cell type not having achieved remission: Secondary | ICD-10-CM | POA: Diagnosis not present

## 2022-05-12 LAB — CMP (CANCER CENTER ONLY)
ALT: 13 U/L (ref 0–44)
AST: 18 U/L (ref 15–41)
Albumin: 4.5 g/dL (ref 3.5–5.0)
Alkaline Phosphatase: 51 U/L (ref 38–126)
Anion gap: 6 (ref 5–15)
BUN: 28 mg/dL — ABNORMAL HIGH (ref 8–23)
CO2: 24 mmol/L (ref 22–32)
Calcium: 9.6 mg/dL (ref 8.9–10.3)
Chloride: 109 mmol/L (ref 98–111)
Creatinine: 1.47 mg/dL — ABNORMAL HIGH (ref 0.61–1.24)
GFR, Estimated: 47 mL/min — ABNORMAL LOW (ref >60)
Glucose, Bld: 113 mg/dL — ABNORMAL HIGH (ref 70–99)
Potassium: 4.5 mmol/L (ref 3.5–5.1)
Sodium: 139 mmol/L (ref 135–145)
Total Bilirubin: 0.4 mg/dL (ref 0.3–1.2)
Total Protein: 7.6 g/dL (ref 6.5–8.1)

## 2022-05-12 LAB — CBC WITH DIFFERENTIAL (CANCER CENTER ONLY)
Abs Immature Granulocytes: 0.07 10*3/uL (ref 0.00–0.07)
Basophils Absolute: 0.1 10*3/uL (ref 0.0–0.1)
Basophils Relative: 0 %
Eosinophils Absolute: 0.2 10*3/uL (ref 0.0–0.5)
Eosinophils Relative: 1 %
HCT: 37 % — ABNORMAL LOW (ref 39.0–52.0)
Hemoglobin: 12.5 g/dL — ABNORMAL LOW (ref 13.0–17.0)
Immature Granulocytes: 0 %
Lymphocytes Relative: 67 %
Lymphs Abs: 14.9 10*3/uL — ABNORMAL HIGH (ref 0.7–4.0)
MCH: 31.3 pg (ref 26.0–34.0)
MCHC: 33.8 g/dL (ref 30.0–36.0)
MCV: 92.5 fL (ref 80.0–100.0)
Monocytes Absolute: 0.8 10*3/uL (ref 0.1–1.0)
Monocytes Relative: 3 %
Neutro Abs: 6.6 10*3/uL (ref 1.7–7.7)
Neutrophils Relative %: 29 %
Platelet Count: 222 10*3/uL (ref 150–400)
RBC: 4 MIL/uL — ABNORMAL LOW (ref 4.22–5.81)
RDW: 13.7 % (ref 11.5–15.5)
Smear Review: NORMAL
WBC Count: 22.6 10*3/uL — ABNORMAL HIGH (ref 4.0–10.5)
nRBC: 0 % (ref 0.0–0.2)

## 2022-05-12 LAB — LACTATE DEHYDROGENASE: LDH: 235 U/L — ABNORMAL HIGH (ref 98–192)

## 2022-05-12 NOTE — Progress Notes (Signed)
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  HISTORY OF PRESENTING ILLNESS: 02/2009  Barry Zavala is a wonderful 82 y.o. male who has been referred to [place] by Dr Marden Noble for evaluation and management of leukocytosis (possible lymphoma). We are joined by his wife, Barry Zavala, via phone. The pt reports that he is doing well overall.  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 He went to see his Dermatologist, in the summer, who prescribed 5% Betamethasone Dipropionate. He has been having to use this cream over a large portion of his body. His rash has improved but has not gone away.    He received his first dose of the COVID19 vaccine on 01/14/2019. His WBC was around 17K at this time. He saw his Cardiologist, Dr. Katrinka Blazing, on 02/02/2019 and found that his WBC had decreased to 13K. He received the second dose of the COVID19 vaccine on 02/04/2019.   He has not felt any different in the last 3-6 months. His other chronic conditions are currently being well managed. Pt golfs 4-5 days per week if the weather allows. He has been on Jardiance for some time and has not needed to be on steroids for any reason within the last 6 months. Pt was a previous smoker and quit in 1988. He does not drink any alcohol. Pt is a retired Engineer, structural. Pt has had some previous dental issues that were corrected by a Periodontist. He denies any current issues with his gums. Pt has never needed a blood transfusion and has not had any tattoos. He has benign prostatitis and there have been recent elevations in PSA levels. His sister passed from Multiple Myeloma  but he is not aware of any other blood disorder or cancers.   Most recent lab results (02/02/2019) of CBC and BMP is as follows: all values are WNL except for WBC at 13.9K, RBC at 4.05, Hgb at 12.5, HCT at 38.1  On review of systems, pt denies fevers, chills, night sweats, unexpected weight loss, abdominal pain, chest pain, SOB, new lumps/bumps, sore throat, rhinorrhea, dysuria, hematuria, difficulty starting urine stream, forcing urine stream and any other symptoms.   On PMHx the pt reports Benign Prostatitis, CAD, Elevated PSA, GERD, HLD, HTN, Type 2 Diabetes. On Social Hx the pt reports that he is a previous smoker (quit in 1988), does not drink any alcohol. He is a retired Clinical research associate. On Family Hx the pt reports that he had a sister who passed from Multiple Myeloma  INTERVAL HISTORY:   [name] is here for continued valuation and management of his CLL.   Patient was last seen by me on 11/11/2021 and he was doing well overall.   Patient is accompanied by his wife during this visit. He reports he has been doing well overall without any new medical concerns since our last visit. He denies fever, chills, night sweats, new lumps/bumps, unexpected weight loss, lethargy, back pain, abdominal pain, chest pain, or leg swelling.   Patient notes he recently scrapped his right elbow due to a recent fall.   MEDICAL HISTORY:  Past Medical History:  Diagnosis Date   Anemia    Arthritis    hands  Chronic kidney disease (CKD), stage III (moderate) (HCC)    Coronary artery disease    Elevated PSA    Erectile dysfunction    GERD (gastroesophageal reflux disease)    Hyperlipidemia    Hypertension    Irritable bowel syndrome    Leukocytosis    2/17//21 Wyvonnia Lora, MD), labs consistent with newly diagnosed chronic lymphocytic leukemia   Myocardial infarction [facility]) 1995   Obesity    Onychomycosis    Palmar fibromatosis    Right bundle branch block    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 Clifton James and Dr. 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   bridge of nose (cx31fu)   Type 2 diabetes mellitus (HCC)     SURGICAL HISTORY: Past Surgical History:  Procedure Laterality Date   APPENDECTOMY     CARDIAC CATHETERIZATION  2021   cardiac stents     CARPAL TUNNEL RELEASE Left 12/09/2016   Procedure: CARPAL TUNNEL RELEASE;  Surgeon: Cindee Salt, MD;  Location: [facility];  Service: Orthopedics;  Laterality: Left;   CARPAL TUNNEL WITH CUBITAL TUNNEL Right 01/13/2017   Procedure: RIGHT CARPAL TUNNEL WITH CUBITAL TUNNEL RELEASE;  Surgeon: Cindee Salt, MD;  Location: [facility];  Service: Orthopedics;  Laterality: Right;   CORONARY PRESSURE/FFR STUDY N/A 11/15/2019   Procedure: INTRAVASCULAR PRESSURE WIRE/FFR STUDY;  Surgeon: Kathleene Hazel, MD;  [facility];  Service: Cardiovascular;  Laterality: N/A;   EYE SURGERY Bilateral 2019   Dr. Nile Riggs   RIGHT/LEFT HEART CATH AND CORONARY ANGIOGRAPHY N/A 02/02/2019   Procedure: RIGHT/LEFT HEART CATH AND CORONARY ANGIOGRAPHY;  Surgeon: Lyn Records, MD;  [facility];  Service: Cardiovascular;  Laterality: N/A;   RIGHT/LEFT HEART CATH AND CORONARY ANGIOGRAPHY N/A 11/15/2019   Procedure: RIGHT/LEFT HEART CATH AND CORONARY ANGIOGRAPHY;  Surgeon: Kathleene Hazel, MD;  [facility];  Service: Cardiovascular;  Laterality: N/A;   TEE WITHOUT CARDIOVERSION N/A 12/13/2019   Procedure: TRANSESOPHAGEAL ECHOCARDIOGRAM (TEE);  Surgeon: Kathleene Hazel, MD;  [facility];  Service: Open Heart Surgery;  Laterality: N/A;   TRANSCATHETER AORTIC VALVE REPLACEMENT, TRANSFEMORAL  12/13/2019   TRANSCATHETER AORTIC VALVE REPLACEMENT, TRANSFEMORAL N/A 12/13/2019   Procedure: TRANSCATHETER AORTIC VALVE REPLACEMENT, TRANSFEMORAL;  Surgeon:  Kathleene Hazel, MD;  [facility];  Service: Open Heart Surgery;  Laterality: N/A;   TRIGGER FINGER RELEASE Left 08/10/2018   Procedure: RELEASE TRIGGER FINGER/A-1 PULLEY THUMB AND LEFT SMALL;  Surgeon: Cindee Salt, MD;  Location: [facility];  Service: Orthopedics;  Laterality: Left;  FAB   ULNAR NERVE TRANSPOSITION Left 12/09/2016   Procedure: DECOMPRESSION LEFT ULNAR NERVE;  Surgeon: Cindee Salt, MD;  Location: [facility];  Service: Orthopedics;  Laterality: Left;   ULNAR NERVE TRANSPOSITION Right 01/13/2017   Procedure: DECOMPRESSION ULNAR NERVE;  Surgeon: Cindee Salt, MD;  Location: [facility];  Service: Orthopedics;  Laterality: Right;    SOCIAL HISTORY: Social History   Socioeconomic History   Marital status: Married    Spouse name: Not on file   Number of children: 2   Years of education: Not on file   Highest education level: Not on file  Occupational History   Occupation: Retired Clinical research associate  Tobacco Use   Smoking status: Former    Packs/day: 1.00  Years: 20.00    Additional pack years: 0.00    Total pack years: 20.00    Types: Cigarettes    Quit date: 108    Years since quitting: 36.3   Smokeless tobacco: Never  Vaping Use   Vaping Use: Never used  Substance and Sexual Activity   Alcohol use: Yes    Alcohol/week: 0.0 standard drinks of alcohol    Comment: social   Drug use: No   Sexual activity: Yes  Other Topics Concern   Not on file  Social History Narrative   Not on file   Social Determinants of Health   Financial Resource Strain: Not on file  Food Insecurity: Not on file  Transportation Needs: Not on file  Physical Activity: Not on file  Stress: Not on file  Social Connections: Not on file  Intimate Partner Violence: Not on file    FAMILY HISTORY: Family History  Problem Relation Age of Onset   Ovarian cancer Mother 64   Heart failure Father    Cancer Paternal Uncle        Karposi Sarcoma    Multiple myeloma Sister 27    ALLERGIES:  has No Known Allergies.  MEDICATIONS:  Current Outpatient Medications  Medication Sig Dispense Refill   ACTOS 45 MG tablet Take 45 mg by mouth daily with supper.   1   amoxicillin (AMOXIL) 500 MG tablet Take 4 tablets (2,000 mg total) one hour prior to all dental visits. 24 tablet 2   aspirin EC 81 MG tablet Take 1 tablet (81 mg total) by mouth daily. 90 tablet 3   Cholecalciferol (VITAMIN D3 SUPER STRENGTH) 50 MCG (2000 UT) TABS Take 2,000 Units by mouth daily.     Dulaglutide (TRULICITY) 0.75 MG/0.5ML SOPN      empagliflozin (JARDIANCE) 10 MG TABS tablet Take 10 mg by mouth daily.      ezetimibe-simvastatin (VYTORIN) 10-20 MG tablet Take 1 tablet by mouth daily. 90 tablet 3   Glucosamine-Chondroitin (GLUCOSAMINE CHONDR COMPLEX PO) Take 1 capsule by mouth 2 (two) times daily. 500 mgs     glyBURIDE-metformin (GLUCOVANCE) 5-500 MG per tablet Take 2 tablets by mouth 2 (two) times daily with a meal.     metoprolol succinate (TOPROL-XL) 50 MG 24 hr tablet Take 50 mg by mouth at bedtime. Take with or immediately following a meal.     Multiple Vitamin (MULTIVITAMIN WITH MINERALS) TABS tablet Take 1 tablet by mouth daily. Centrum silver     omega-3 acid ethyl esters (LOVAZA) 1 G capsule Take 1 g by mouth in the morning, at noon, and at bedtime.      omeprazole (PRILOSEC) 20 MG capsule Take 1 capsule (20 mg total) by mouth 2 (two) times a week. 30 capsule    ramipril (ALTACE) 10 MG capsule Take 10 mg by mouth daily with supper.      No current facility-administered medications for this visit.    REVIEW OF SYSTEMS:   10 Point review of Systems was done is negative except as noted above.  PHYSICAL EXAMINATION: ECOG PERFORMANCE STATUS: 1 - Symptomatic but completely ambulatory .Marland KitchenBP (!) 141/58 (BP Location: Left Arm, Patient Position: Sitting) Comment: RN notified.  Pulse 66   Temp 97.7 F (36.5 C) (Temporal)   Resp 16   Wt 182 lb 1.6 oz (82.6 kg)    SpO2 100%   BMI 25.40 kg/m   GENERAL:alert, in no acute distress and comfortable SKIN: no acute rashes, no significant lesions EYES: conjunctiva are  pink and non-injected, sclera anicteric OROPHARYNX: MMM NECK: supple, no JVD LYMPH:  no palpable lymphadenopathy in the cervical, axillary or inguinal regions LUNGS: clear to auscultation b/l with normal respiratory effort HEART: regular rate & rhythm ABDOMEN:  normoactive bowel sounds , non tender, not distended. Extremity: no pedal edema PSYCH: alert & oriented x 3 with fluent speech NEURO: no focal motor/sensory deficits   LABORATORY DATA:  I have reviewed the data as listed  .    Latest Ref Rng & Units 05/12/2022   12:53 PM 11/11/2021   12:24 PM 12/14/2019    1:50 AM  CBC  WBC 4.0 - 10.5 K/uL 22.6  24.7  14.6   Hemoglobin 13.0 - 17.0 g/dL 16.1  09.6  04.5   Hematocrit 39.0 - 52.0 % 37.0  36.9  30.1   Platelets 150 - 400 K/uL 222  212  170    .    Latest Ref Rng & Units 05/12/2022   12:53 PM 11/11/2021   12:24 PM 12/14/2019    1:50 AM  CMP  Glucose 70 - 99 mg/dL 409  811  914   BUN 8 - 23 mg/dL 28  27  29    Creatinine 0.61 - 1.24 mg/dL 7.82  9.56  2.13   Sodium 135 - 145 mmol/L 139  138  137   Potassium 3.5 - 5.1 mmol/L 4.5  4.6  4.0   Chloride 98 - 111 mmol/L 109  106  108   CO2 22 - 32 mmol/L 24  25  21    Calcium 8.9 - 10.3 mg/dL 9.6  9.6  8.3   Total Protein 6.5 - 8.1 g/dL 7.6  7.5    Total Bilirubin 0.3 - 1.2 mg/dL 0.4  0.4    Alkaline Phos 38 - 126 U/L 51  66    AST 15 - 41 U/L 18  19    ALT 0 - 44 U/L 13  15     . Lab Results  Component Value Date   LDH 235 (H) 05/12/2022       02/08/2019 Flow Pathology:     WBC  Lymphs Abs   12/05/2015 10.1K 3.5K  12/10/2017 12.0K 7.0K  12/15/2018 14.9K 8.0K   RADIOGRAPHIC STUDIES: I have personally reviewed the radiological images as listed and agreed with the findings in the report. No results found.   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 CBC shows improved WBC from 24 K to 22.6 K, slightly decreased hemoglobin at 12.5, and decreased hematocrit of 37.0. Overall stable. CMP is pending. -Notes no clinical lymphadenopathy or splenomegaly. -patient has no lab or clinical evidence of significant CLL progression at this time. -no indication to initial CLL directed treatment at this time.  FOLLOW UP: RTC with Dr Candise Che with labs in 6 months   The total time spent in the appointment was 20 minutes* .  All of the patient's questions were answered with apparent satisfaction. The patient knows to call the clinic with any problems, questions or concerns.   Wyvonnia Lora MD MS AAHIVMS [facility] Hematology/Oncology Physician [facility]  .*Total Encounter Time as defined by the Centers for Medicare and Medicaid Services includes, in addition to the face-to-face time of a patient visit (documented in the note above) non-face-to-face time: obtaining and reviewing outside history, ordering and reviewing medications, tests  or procedures, care coordination (communications with other health care professionals or caregivers) and documentation in the medical record.   I, Ok Edwards, am acting as a Neurosurgeon for Wyvonnia Lora, MD. .I have reviewed the above documentation for accuracy and completeness, and I agree with the above. Johney Maine MD

## 2022-05-13 ENCOUNTER — Telehealth: Payer: Self-pay | Admitting: Hematology

## 2022-06-23 ENCOUNTER — Other Ambulatory Visit: Payer: Self-pay | Admitting: Physician Assistant

## 2022-06-29 NOTE — Progress Notes (Unsigned)
No chief complaint on file.  History of Present Illness: 82 yo male with history of severe aortic stenosis s/p TAVR, CAD, HLD, HTN, DM, CKD stage3 and RBBB here today for cardiac follow up. He has been followed by Dr. Katrinka Blazing.  He is known to have CAD with MI in 1995 and PCI in 2001. Cardiac cath November 2021 with patent mid LAD stent, patent mid Circumflex stent into the OM, moderate eccentric mid to distal RCA stenosis not flow limiting by flow wire assessemnt (RFR 0.97). He had severe aortic stenosis and underwent TAVR in December 2021 with placement of a  23 mm Edwards Sapien 3 Ultra THV via the transfemoral approach. Echo November 2022 with normal LV function and normally functioning AVR. Carotid artery dopplers December 2023 with mild bilateral disease.   He is here today for follow up. The patient denies any chest pain, dyspnea, palpitations, lower extremity edema, orthopnea, PND, dizziness, near syncope or syncope.   Primary Care Physician: Marden Noble, MD (Inactive)   Past Medical History:  Diagnosis Date   Anemia    Arthritis    hands   Chronic kidney disease (CKD), stage III (moderate) (HCC)    Coronary artery disease    Elevated PSA    Erectile dysfunction    GERD (gastroesophageal reflux disease)    Hyperlipidemia    Hypertension    Irritable bowel syndrome    Leukocytosis    2/17//21 Wyvonnia Lora, MD), labs consistent with newly diagnosed chronic lymphocytic leukemia   Myocardial infarction [facility]) 1995   Obesity    Onychomycosis    Palmar fibromatosis    Right bundle branch block    S/P TAVR (transcatheter aortic valve replacement) 12/13/2019   s/p TAVR with 23 mm Edwards S3U via the TF approach by Dr. Clifton James and Dr. 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   bridge of nose (cx97fu)   Type 2 diabetes mellitus (HCC)     Past Surgical History:  Procedure Laterality Date   APPENDECTOMY     CARDIAC CATHETERIZATION  2021    cardiac stents     CARPAL TUNNEL RELEASE Left 12/09/2016   Procedure: CARPAL TUNNEL RELEASE;  Surgeon: Cindee Salt, MD;  Location: [facility];  Service: Orthopedics;  Laterality: Left;   CARPAL TUNNEL WITH CUBITAL TUNNEL Right 01/13/2017   Procedure: RIGHT CARPAL TUNNEL WITH CUBITAL TUNNEL RELEASE;  Surgeon: Cindee Salt, MD;  Location: [facility];  Service: Orthopedics;  Laterality: Right;   CORONARY PRESSURE/FFR STUDY N/A 11/15/2019   Procedure: INTRAVASCULAR PRESSURE WIRE/FFR STUDY;  Surgeon: Kathleene Hazel, MD;  [facility];  Service: Cardiovascular;  Laterality: N/A;   EYE SURGERY Bilateral 2019   Dr. Nile Riggs   RIGHT/LEFT HEART CATH AND CORONARY ANGIOGRAPHY N/A 02/02/2019   Procedure: RIGHT/LEFT HEART CATH AND CORONARY ANGIOGRAPHY;  Surgeon: Lyn Records, MD;  [facility];  Service: Cardiovascular;  Laterality: N/A;   RIGHT/LEFT HEART CATH AND CORONARY ANGIOGRAPHY N/A 11/15/2019   Procedure: RIGHT/LEFT HEART CATH AND CORONARY ANGIOGRAPHY;  Surgeon: Kathleene Hazel, MD;  [facility];  Service: Cardiovascular;  Laterality: N/A;   TEE WITHOUT CARDIOVERSION N/A 12/13/2019   Procedure: TRANSESOPHAGEAL ECHOCARDIOGRAM (TEE);  Surgeon: Kathleene Hazel, MD;  [facility]: [facility];  Service: Open Heart Surgery;  Laterality: N/A;   TRANSCATHETER AORTIC VALVE REPLACEMENT, TRANSFEMORAL  12/13/2019   TRANSCATHETER AORTIC VALVE REPLACEMENT, TRANSFEMORAL N/A  12/13/2019   Procedure: TRANSCATHETER AORTIC VALVE REPLACEMENT, TRANSFEMORAL;  Surgeon: Kathleene Hazel, MD;  [facility];  Service: Open Heart Surgery;  Laterality: N/A;   TRIGGER FINGER RELEASE Left 08/10/2018   Procedure: RELEASE TRIGGER FINGER/A-1 PULLEY THUMB AND LEFT SMALL;  Surgeon: Cindee Salt, MD;  Location: [facility];  Service: Orthopedics;  Laterality: Left;  FAB   ULNAR NERVE TRANSPOSITION Left 12/09/2016   Procedure:  DECOMPRESSION LEFT ULNAR NERVE;  Surgeon: Cindee Salt, MD;  Location: [facility];  Service: Orthopedics;  Laterality: Left;   ULNAR NERVE TRANSPOSITION Right 01/13/2017   Procedure: DECOMPRESSION ULNAR NERVE;  Surgeon: Cindee Salt, MD;  Location: [facility];  Service: Orthopedics;  Laterality: Right;    Current Outpatient Medications  Medication Sig Dispense Refill   ACTOS 45 MG tablet Take 45 mg by mouth daily with supper.   1   amoxicillin (AMOXIL) 500 MG capsule Take 4 capsules (2,000 mg total) one hour prior to all dental visits. 4 capsule 4   aspirin EC 81 MG tablet Take 1 tablet (81 mg total) by mouth daily. 90 tablet 3   Cholecalciferol (VITAMIN D3 SUPER STRENGTH) 50 MCG (2000 UT) TABS Take 2,000 Units by mouth daily.     Dulaglutide (TRULICITY) 0.75 MG/0.5ML SOPN      empagliflozin (JARDIANCE) 10 MG TABS tablet Take 10 mg by mouth daily.      ezetimibe-simvastatin (VYTORIN) 10-20 MG tablet Take 1 tablet by mouth daily. 90 tablet 3   Glucosamine-Chondroitin (GLUCOSAMINE CHONDR COMPLEX PO) Take 1 capsule by mouth 2 (two) times daily. 500 mgs     glyBURIDE-metformin (GLUCOVANCE) 5-500 MG per tablet Take 2 tablets by mouth 2 (two) times daily with a meal.     metoprolol succinate (TOPROL-XL) 50 MG 24 hr tablet Take 50 mg by mouth at bedtime. Take with or immediately following a meal.     Multiple Vitamin (MULTIVITAMIN WITH MINERALS) TABS tablet Take 1 tablet by mouth daily. Centrum silver     omega-3 acid ethyl esters (LOVAZA) 1 G capsule Take 1 g by mouth in the morning, at noon, and at bedtime.      omeprazole (PRILOSEC) 20 MG capsule Take 1 capsule (20 mg total) by mouth 2 (two) times a week. 30 capsule    ramipril (ALTACE) 10 MG capsule Take 10 mg by mouth daily with supper.      No current facility-administered medications for this visit.    No Known Allergies  Social History   Socioeconomic History   Marital status: Married    Spouse name: Not on  file   Number of children: 2   Years of education: Not on file   Highest education level: Not on file  Occupational History   Occupation: Retired Clinical research associate  Tobacco Use   Smoking status: Former    Packs/day: 1.00    Years: 20.00    Additional pack years: 0.00    Total pack years: 20.00    Types: Cigarettes    Quit date: 1988    Years since quitting: 36.5   Smokeless tobacco: Never  Vaping Use   Vaping Use: Never used  Substance and Sexual Activity   Alcohol use: Yes    Alcohol/week: 0.0 standard drinks of alcohol    Comment: social   Drug use: No   Sexual activity: Yes  Other Topics Concern   Not on file  Social History Narrative   Not on file   Social Determinants of  Health   Financial Resource Strain: Not on file  Food Insecurity: Not on file  Transportation Needs: Not on file  Physical Activity: Not on file  Stress: Not on file  Social Connections: Not on file  Intimate Partner Violence: Not on file    Family History  Problem Relation Age of Onset   Ovarian cancer Mother 99   Heart failure Father    Cancer Paternal Uncle        Karposi Sarcoma   Multiple myeloma Sister 57    Review of Systems:  As stated in the HPI and otherwise negative.   There were no vitals taken for this visit.  Physical Examination: General: Well developed, well nourished, NAD  HEENT: OP clear, mucus membranes moist  SKIN: warm, dry. No rashes. Neuro: No focal deficits  Musculoskeletal: Muscle strength 5/5 all ext  Psychiatric: Mood and affect normal  Neck: No JVD, no carotid bruits, no thyromegaly, no lymphadenopathy.  Lungs:Clear bilaterally, no wheezes, rhonci, crackles Cardiovascular: Regular rate and rhythm. No murmurs, gallops or rubs. Abdomen:Soft. Bowel sounds present. Non-tender.  Extremities: No lower extremity edema. Pulses are 2 + in the bilateral DP/PT.  EKG:  EKG {ACTION; IS/IS [id number]} ordered today. The ekg ordered today demonstrates   Echo November  2022:  1. Left ventricular ejection fraction, by estimation, is 60 to 65%. The  left ventricle has normal function. The left ventricle has no regional  wall motion abnormalities. Left ventricular diastolic parameters are  indeterminate.   2. Right ventricular systolic function is normal. The right ventricular  size is normal.   3. The mitral valve is normal in structure. Trivial mitral valve  regurgitation. No evidence of mitral stenosis.   4. The aortic valve has been repaired/replaced. Aortic valve  regurgitation is not visualized. There is a 23 mm Sapien prosthetic (TAVR)  valve present in the aortic position. Procedure Date: 12/23/2019. Echo  findings are consistent with normal structure  and function of the aortic valve prosthesis. Aortic valve area, by VTI  measures 1.21 cm. Aortic valve mean gradient measures 17.0 mmHg. Aortic  valve Vmax measures 2.90 m/s.   Recent Labs: 05/12/2022: ALT 13; BUN 28; Creatinine 1.47; Hemoglobin 12.5; Platelet Count 222; Potassium 4.5; Sodium 139   Lipid Panel    Component Value Date/Time   CHOL 83 (L) 01/24/2015 0949   TRIG 49 01/24/2015 0949   HDL 38 (L) 01/24/2015 0949   CHOLHDL 2.2 01/24/2015 0949   VLDL 10 01/24/2015 0949   LDLCALC 35 01/24/2015 0949     Wt Readings from Last 3 Encounters:  05/12/22 82.6 kg  12/11/21 84.2 kg  11/11/21 83.2 kg      Assessment and Plan:   1. CAD without angina: No chest pain suggestive of angina. Will continue ASA, statin and beta blocker  2. Aortic valve disease: He is s/p TAVR for severe aortic stenosis in December 2021. Continue ASA. Continue SBE prophylaxis as indicated.   3. HTN: BP is well controlled. No changes  4. Hyperlipidemia: Lipids followed in primary care. LDL ***. Continue Vytorin.   5. Carotid artery disease: Mild disease by dopplers December 2023  Labs/ tests ordered today include:  No orders of the defined types were placed in this encounter.  Disposition:   F/U with me in  ***   Signed, Verne Carrow, MD, [facility] 06/29/2022 8:59 AM    [facility] [address] [place], [place], [place]  [address] Phone: [phone]; Fax: [phone]

## 2022-06-30 ENCOUNTER — Other Ambulatory Visit: Payer: Self-pay

## 2022-06-30 ENCOUNTER — Encounter: Payer: Self-pay | Admitting: Cardiovascular Disease

## 2022-06-30 ENCOUNTER — Ambulatory Visit: Payer: Medicare Other | Attending: Cardiovascular Disease | Admitting: Cardiovascular Disease

## 2022-06-30 VITALS — BP 112/68 | HR 74 | Ht 71.0 in | Wt 182.6 lb

## 2022-06-30 DIAGNOSIS — Z952 Presence of prosthetic heart valve: Secondary | ICD-10-CM

## 2022-06-30 DIAGNOSIS — I2510 Atherosclerotic heart disease of native coronary artery without angina pectoris: Secondary | ICD-10-CM | POA: Diagnosis present

## 2022-06-30 DIAGNOSIS — R0989 Other specified symptoms and signs involving the circulatory and respiratory systems: Secondary | ICD-10-CM | POA: Diagnosis present

## 2022-06-30 DIAGNOSIS — I10 Essential (primary) hypertension: Secondary | ICD-10-CM | POA: Diagnosis present

## 2022-06-30 DIAGNOSIS — E782 Mixed hyperlipidemia: Secondary | ICD-10-CM

## 2022-06-30 MED ORDER — AMOXICILLIN 500 MG PO CAPS: ORAL_CAPSULE | ORAL | 3 refills | Status: AC

## 2022-06-30 NOTE — Patient Instructions (Signed)
Medication Instructions:  No changes *If you need a refill on your cardiac medications before your next appointment, please call your pharmacy*   Lab Work: none   Testing/Procedures: none   Follow-Up: At [facility], you and your health needs are our priority.  As part of our continuing mission to provide you with exceptional heart care, we have created designated Provider Care Teams.  These Care Teams include your primary Cardiologist (physician) and Advanced Practice Providers (APPs -  Physician Assistants and Nurse Practitioners) who all work together to provide you with the care you need, when you need it.   Your next appointment:   6 month(s)  Provider:   Christopher McAlhany, MD   

## 2022-09-04 DIAGNOSIS — C9110 Chronic lymphocytic leukemia of B-cell type not having achieved remission: Secondary | ICD-10-CM | POA: Diagnosis not present

## 2022-09-04 DIAGNOSIS — I2510 Atherosclerotic heart disease of native coronary artery without angina pectoris: Secondary | ICD-10-CM | POA: Diagnosis not present

## 2022-09-04 DIAGNOSIS — R918 Other nonspecific abnormal finding of lung field: Secondary | ICD-10-CM | POA: Diagnosis not present

## 2022-09-04 DIAGNOSIS — Z952 Presence of prosthetic heart valve: Secondary | ICD-10-CM | POA: Diagnosis not present

## 2022-09-04 DIAGNOSIS — N1832 Chronic kidney disease, stage 3b: Secondary | ICD-10-CM | POA: Diagnosis not present

## 2022-09-04 DIAGNOSIS — I700 Atherosclerosis of aorta: Secondary | ICD-10-CM | POA: Diagnosis not present

## 2022-09-04 DIAGNOSIS — I1310 Hypertensive heart and chronic kidney disease without heart failure, with stage 1 through stage 4 chronic kidney disease, or unspecified chronic kidney disease: Secondary | ICD-10-CM | POA: Diagnosis not present

## 2022-09-04 DIAGNOSIS — E1129 Type 2 diabetes mellitus with other diabetic kidney complication: Secondary | ICD-10-CM | POA: Diagnosis not present

## 2022-09-04 DIAGNOSIS — I6523 Occlusion and stenosis of bilateral carotid arteries: Secondary | ICD-10-CM | POA: Diagnosis not present

## 2022-09-04 DIAGNOSIS — E785 Hyperlipidemia, unspecified: Secondary | ICD-10-CM | POA: Diagnosis not present

## 2022-09-05 DIAGNOSIS — Z23 Encounter for immunization: Secondary | ICD-10-CM | POA: Diagnosis not present

## 2022-09-11 DIAGNOSIS — R972 Elevated prostate specific antigen [PSA]: Secondary | ICD-10-CM | POA: Diagnosis not present

## 2022-09-11 DIAGNOSIS — N183 Chronic kidney disease, stage 3 unspecified: Secondary | ICD-10-CM | POA: Diagnosis not present

## 2022-09-11 DIAGNOSIS — L859 Epidermal thickening, unspecified: Secondary | ICD-10-CM | POA: Diagnosis not present

## 2022-09-11 DIAGNOSIS — C9110 Chronic lymphocytic leukemia of B-cell type not having achieved remission: Secondary | ICD-10-CM | POA: Diagnosis not present

## 2022-09-11 DIAGNOSIS — Z1331 Encounter for screening for depression: Secondary | ICD-10-CM | POA: Diagnosis not present

## 2022-09-11 DIAGNOSIS — E1169 Type 2 diabetes mellitus with other specified complication: Secondary | ICD-10-CM | POA: Diagnosis not present

## 2022-09-11 DIAGNOSIS — Z79899 Other long term (current) drug therapy: Secondary | ICD-10-CM | POA: Diagnosis not present

## 2022-09-11 DIAGNOSIS — I2510 Atherosclerotic heart disease of native coronary artery without angina pectoris: Secondary | ICD-10-CM | POA: Diagnosis not present

## 2022-09-11 DIAGNOSIS — E559 Vitamin D deficiency, unspecified: Secondary | ICD-10-CM | POA: Diagnosis not present

## 2022-09-11 DIAGNOSIS — Z0000 Encounter for general adult medical examination without abnormal findings: Secondary | ICD-10-CM | POA: Diagnosis not present

## 2022-09-11 DIAGNOSIS — E782 Mixed hyperlipidemia: Secondary | ICD-10-CM | POA: Diagnosis not present

## 2022-09-23 DIAGNOSIS — R972 Elevated prostate specific antigen [PSA]: Secondary | ICD-10-CM | POA: Diagnosis not present

## 2022-09-30 DIAGNOSIS — R351 Nocturia: Secondary | ICD-10-CM | POA: Diagnosis not present

## 2022-09-30 DIAGNOSIS — R972 Elevated prostate specific antigen [PSA]: Secondary | ICD-10-CM | POA: Diagnosis not present

## 2022-09-30 DIAGNOSIS — N401 Enlarged prostate with lower urinary tract symptoms: Secondary | ICD-10-CM | POA: Diagnosis not present

## 2022-09-30 DIAGNOSIS — Z23 Encounter for immunization: Secondary | ICD-10-CM | POA: Diagnosis not present

## 2022-11-06 ENCOUNTER — Other Ambulatory Visit: Payer: Self-pay

## 2022-11-06 DIAGNOSIS — C9110 Chronic lymphocytic leukemia of B-cell type not having achieved remission: Secondary | ICD-10-CM

## 2022-11-10 ENCOUNTER — Inpatient Hospital Stay (HOSPITAL_BASED_OUTPATIENT_CLINIC_OR_DEPARTMENT_OTHER): Payer: Medicare Other | Admitting: Hematology

## 2022-11-10 ENCOUNTER — Inpatient Hospital Stay: Payer: Medicare Other | Attending: Internal Medicine

## 2022-11-10 VITALS — BP 130/58 | HR 65 | Temp 97.8°F | Resp 20 | Wt 185.4 lb

## 2022-11-10 DIAGNOSIS — C9110 Chronic lymphocytic leukemia of B-cell type not having achieved remission: Secondary | ICD-10-CM

## 2022-11-10 DIAGNOSIS — Z79899 Other long term (current) drug therapy: Secondary | ICD-10-CM | POA: Diagnosis not present

## 2022-11-10 LAB — CBC WITH DIFFERENTIAL (CANCER CENTER ONLY)
Abs Immature Granulocytes: 0.04 10*3/uL (ref 0.00–0.07)
Basophils Absolute: 0.1 10*3/uL (ref 0.0–0.1)
Basophils Relative: 0 %
Eosinophils Absolute: 0.2 10*3/uL (ref 0.0–0.5)
Eosinophils Relative: 1 %
HCT: 34.5 % — ABNORMAL LOW (ref 39.0–52.0)
Hemoglobin: 11.7 g/dL — ABNORMAL LOW (ref 13.0–17.0)
Immature Granulocytes: 0 %
Lymphocytes Relative: 68 %
Lymphs Abs: 14.4 10*3/uL — ABNORMAL HIGH (ref 0.7–4.0)
MCH: 31.8 pg (ref 26.0–34.0)
MCHC: 33.9 g/dL (ref 30.0–36.0)
MCV: 93.8 fL (ref 80.0–100.0)
Monocytes Absolute: 0.8 10*3/uL (ref 0.1–1.0)
Monocytes Relative: 4 %
Neutro Abs: 5.6 10*3/uL (ref 1.7–7.7)
Neutrophils Relative %: 27 %
Platelet Count: 220 10*3/uL (ref 150–400)
RBC: 3.68 MIL/uL — ABNORMAL LOW (ref 4.22–5.81)
RDW: 13.8 % (ref 11.5–15.5)
Smear Review: NORMAL
WBC Count: 21.2 10*3/uL — ABNORMAL HIGH (ref 4.0–10.5)
nRBC: 0 % (ref 0.0–0.2)

## 2022-11-10 LAB — CMP (CANCER CENTER ONLY)
ALT: 16 U/L (ref 0–44)
AST: 20 U/L (ref 15–41)
Albumin: 4.3 g/dL (ref 3.5–5.0)
Alkaline Phosphatase: 67 U/L (ref 38–126)
Anion gap: 4 — ABNORMAL LOW (ref 5–15)
BUN: 27 mg/dL — ABNORMAL HIGH (ref 8–23)
CO2: 26 mmol/L (ref 22–32)
Calcium: 9.6 mg/dL (ref 8.9–10.3)
Chloride: 109 mmol/L (ref 98–111)
Creatinine: 1.61 mg/dL — ABNORMAL HIGH (ref 0.61–1.24)
GFR, Estimated: 42 mL/min — ABNORMAL LOW (ref >60)
Glucose, Bld: 205 mg/dL — ABNORMAL HIGH (ref 70–99)
Potassium: 4.7 mmol/L (ref 3.5–5.1)
Sodium: 139 mmol/L (ref 135–145)
Total Bilirubin: 0.4 mg/dL (ref <1.2)
Total Protein: 7.3 g/dL (ref 6.5–8.1)

## 2022-11-10 LAB — LACTATE DEHYDROGENASE: LDH: 237 U/L — ABNORMAL HIGH (ref 98–192)

## 2022-11-10 NOTE — Progress Notes (Signed)
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  HISTORY OF PRESENTING ILLNESS: 02/2009  Barry Zavala is a wonderful 82 y.o. male who has been referred to [place] by Dr Marden Noble for evaluation and management of leukocytosis (possible lymphoma). We are joined by his wife, Barry Zavala, via phone. The pt reports that he is doing well overall.  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 He went to see his Dermatologist, in the summer, who prescribed 5% Betamethasone Dipropionate. He has been having to use this cream over a large portion of his body. His rash has improved but has not gone away.    He received his first dose of the COVID19 vaccine on 01/14/2019. His WBC was around 17K at this time. He saw his Cardiologist, Dr. Katrinka Blazing, on 02/02/2019 and found that his WBC had decreased to 13K. He received the second dose of the COVID19 vaccine on 02/04/2019.   He has not felt any different in the last 3-6 months. His other chronic conditions are currently being well managed. Pt golfs 4-5 days per week if the weather allows. He has been on Jardiance for some time and has not needed to be on steroids for any reason within the last 6 months. Pt was a previous smoker and quit in 1988. He does not drink any alcohol. Pt is a retired Engineer, structural. Pt has had some previous dental issues that were corrected by a Periodontist. He denies any current issues with his gums. Pt has never needed a blood transfusion and has not had any tattoos. He has benign prostatitis and there have been recent elevations in PSA levels. His sister passed from Multiple  Myeloma but he is not aware of any other blood disorder or cancers.   Most recent lab results (02/02/2019) of CBC and BMP is as follows: all values are WNL except for WBC at 13.9K, RBC at 4.05, Hgb at 12.5, HCT at 38.1  On review of systems, pt denies fevers, chills, night sweats, unexpected weight loss, abdominal pain, chest pain, SOB, new lumps/bumps, sore throat, rhinorrhea, dysuria, hematuria, difficulty starting urine stream, forcing urine stream and any other symptoms.   On PMHx the pt reports Benign Prostatitis, CAD, Elevated PSA, GERD, HLD, HTN, Type 2 Diabetes. On Social Hx the pt reports that he is a previous smoker (quit in 1988), does not drink any alcohol. He is a retired Clinical research associate. On Family Hx the pt reports that he had a sister who passed from Multiple Myeloma  INTERVAL HISTORY:   Barry Zavala is here for continued valuation and management of his CLL.   Patient was last seen by me on 05/12/2022 and he was doing well overall.   Patient is accompanied by his wife during this visit. Patient notes he has been doing well overall without any new or severe medical concerns since our last visit.   He denies any new infection issues, fever, chills, night sweats, new lump/bump, chest pain, back pain, abdominal pain, or leg swelling.   He is complaint with all of his medications.   Patient is UTD with influenza vaccine, COVID-19 Booster, RSV vaccine, and other age related vaccines.   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 Clifton James and Dr. 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   bridge of nose ([id number])   Type 2 diabetes mellitus (HCC)     SURGICAL HISTORY: Past Surgical History:  Procedure Laterality Date   APPENDECTOMY     CARDIAC CATHETERIZATION  2021   cardiac stents     CARPAL TUNNEL RELEASE Left 12/09/2016   Procedure: CARPAL TUNNEL RELEASE;  Surgeon: Cindee Salt, MD;  Location: [facility];  Service: Orthopedics;  Laterality: Left;   CARPAL TUNNEL WITH CUBITAL TUNNEL Right 01/13/2017   Procedure: RIGHT CARPAL TUNNEL WITH CUBITAL TUNNEL RELEASE;  Surgeon: Cindee Salt, MD;  Location: [facility];  Service: Orthopedics;  Laterality: Right;   CORONARY PRESSURE/FFR STUDY N/A 11/15/2019   Procedure: INTRAVASCULAR PRESSURE WIRE/FFR STUDY;  Surgeon: Kathleene Hazel, MD;  Location: [facility];  Service: Cardiovascular;  Laterality: N/A;   EYE SURGERY Bilateral 2019   Dr. Nile Riggs   RIGHT/LEFT HEART CATH AND CORONARY ANGIOGRAPHY N/A 02/02/2019   Procedure: RIGHT/LEFT HEART CATH AND CORONARY ANGIOGRAPHY;  Surgeon: Lyn Records, MD;  Location: [facility];  Service: Cardiovascular;  Laterality: N/A;   RIGHT/LEFT HEART CATH AND CORONARY ANGIOGRAPHY N/A 11/15/2019   Procedure: RIGHT/LEFT HEART CATH AND CORONARY ANGIOGRAPHY;  Surgeon: Kathleene Hazel, MD;  Location: [facility];  Service: Cardiovascular;  Laterality: N/A;   TEE WITHOUT CARDIOVERSION N/A 12/13/2019   Procedure: TRANSESOPHAGEAL ECHOCARDIOGRAM (TEE);  Surgeon: Kathleene Hazel, MD;  Location: [facility];  Service: Open Heart Surgery;  Laterality: N/A;   TRANSCATHETER AORTIC VALVE REPLACEMENT, TRANSFEMORAL  12/13/2019   TRANSCATHETER AORTIC VALVE REPLACEMENT,  TRANSFEMORAL N/A 12/13/2019   Procedure: TRANSCATHETER AORTIC VALVE REPLACEMENT, TRANSFEMORAL;  Surgeon: Kathleene Hazel, MD;  Location: [facility];  Service: Open Heart Surgery;  Laterality: N/A;   TRIGGER FINGER RELEASE Left 08/10/2018   Procedure: RELEASE TRIGGER FINGER/A-1 PULLEY THUMB AND LEFT SMALL;  Surgeon: Cindee Salt, MD;  Location: [facility];  Service: Orthopedics;  Laterality: Left;  FAB   ULNAR NERVE TRANSPOSITION Left 12/09/2016   Procedure: DECOMPRESSION LEFT ULNAR NERVE;  Surgeon: Cindee Salt, MD;  Location: [facility];  Service: Orthopedics;  Laterality: Left;   ULNAR NERVE TRANSPOSITION Right 01/13/2017   Procedure: DECOMPRESSION ULNAR NERVE;  Surgeon: Cindee Salt, MD;  Location: [facility];  Service: Orthopedics;  Laterality: Right;    SOCIAL HISTORY: Social History   Socioeconomic History   Marital status: Married    Spouse name: Not on file   Number of children: 2   Years of education: Not on file   Highest education level: Not on file  Occupational History  Occupation: Retired Clinical research associate  Tobacco Use   Smoking status: Former    Current packs/day: 0.00    Average packs/day: 1 pack/day for 20.0 years (20.0 ttl pk-yrs)    Types: Cigarettes    Start date: 1968    Quit date: 1988    Years since quitting: 36.8   Smokeless tobacco: Never  Vaping Use   Vaping status: Never Used  Substance and Sexual Activity   Alcohol use: Yes    Alcohol/week: 0.0 standard drinks of alcohol    Comment: social   Drug use: No   Sexual activity: Yes  Other Topics Concern   Not on file  Social History Narrative   Not on file   Social Determinants of Health   Financial Resource Strain: Not on file  Food Insecurity: Not on file  Transportation Needs: Not on file  Physical Activity: Not on file  Stress: Not on file  Social Connections: Not on file  Intimate Partner Violence: Not on file    FAMILY HISTORY: Family History  Problem  Relation Age of Onset   Ovarian cancer Mother 77   Heart failure Father    Cancer Paternal Uncle        [place]i Sarcoma   Multiple myeloma Sister 33    ALLERGIES:  has No Known Allergies.  MEDICATIONS:  Current Outpatient Medications  Medication Sig Dispense Refill   ACTOS 45 MG tablet Take 45 mg by mouth daily with supper.   1   amoxicillin (AMOXIL) 500 MG capsule Take 4 capsules (2,000 mg total) one hour prior to all dental visits. 24 capsule 3   aspirin EC 81 MG tablet Take 1 tablet (81 mg total) by mouth daily. 90 tablet 3   Cholecalciferol (VITAMIN D3 SUPER STRENGTH) 50 MCG (2000 UT) TABS Take 2,000 Units by mouth daily.     Dulaglutide (TRULICITY) 0.75 MG/0.5ML SOPN      empagliflozin (JARDIANCE) 10 MG TABS tablet Take 10 mg by mouth daily.      ezetimibe-simvastatin (VYTORIN) 10-20 MG tablet Take 1 tablet by mouth daily. 90 tablet 3   Glucosamine-Chondroitin (GLUCOSAMINE CHONDR COMPLEX PO) Take 1 capsule by mouth 2 (two) times daily. 500 mgs     glyBURIDE-metformin (GLUCOVANCE) 5-500 MG per tablet Take 2 tablets by mouth 2 (two) times daily with a meal.     metoprolol succinate (TOPROL-XL) 50 MG 24 hr tablet Take 50 mg by mouth at bedtime. Take with or immediately following a meal.     Multiple Vitamin (MULTIVITAMIN WITH MINERALS) TABS tablet Take 1 tablet by mouth daily. Centrum silver     omega-3 acid ethyl esters (LOVAZA) 1 G capsule Take 1 g by mouth in the morning, at noon, and at bedtime.      omeprazole (PRILOSEC) 20 MG capsule Take 1 capsule (20 mg total) by mouth 2 (two) times a week. 30 capsule    ramipril (ALTACE) 10 MG capsule Take 10 mg by mouth daily with supper.      No current facility-administered medications for this visit.    REVIEW OF SYSTEMS:   10 Point review of Systems was done is negative except as noted above.  PHYSICAL EXAMINATION: ECOG PERFORMANCE STATUS: 1 - Symptomatic but completely ambulatory .Marland KitchenBP (!) 130/58   Pulse 65   Temp 97.8 F (36.6  C)   Resp 20   Wt 185 lb 6.4 oz (84.1 kg)   SpO2 100%   BMI 25.86 kg/m   GENERAL:alert, in no acute distress and comfortable  SKIN: no acute rashes, no significant lesions EYES: conjunctiva are pink and non-injected, sclera anicteric OROPHARYNX: MMM NECK: supple, no JVD LYMPH:  no palpable lymphadenopathy in the cervical, axillary or inguinal regions LUNGS: clear to auscultation b/l with normal respiratory effort HEART: regular rate & rhythm ABDOMEN:  normoactive bowel sounds , non tender, not distended. Extremity: no pedal edema PSYCH: alert & oriented x 3 with fluent speech NEURO: no focal motor/sensory deficits   LABORATORY DATA:  I have reviewed the data as listed  .    Latest Ref Rng & Units 05/12/2022   12:53 PM 11/11/2021   12:24 PM 12/14/2019    1:50 AM  CBC  WBC 4.0 - 10.5 K/uL 22.6  24.7  14.6   Hemoglobin 13.0 - 17.0 g/dL 16.1  09.6  04.5   Hematocrit 39.0 - 52.0 % 37.0  36.9  30.1   Platelets 150 - 400 K/uL 222  212  170    .    Latest Ref Rng & Units 05/12/2022   12:53 PM 11/11/2021   12:24 PM 12/14/2019    1:50 AM  CMP  Glucose 70 - 99 mg/dL 409  811  914   BUN 8 - 23 mg/dL 28  27  29    Creatinine 0.61 - 1.24 mg/dL 7.82  9.56  2.13   Sodium 135 - 145 mmol/L 139  138  137   Potassium 3.5 - 5.1 mmol/L 4.5  4.6  4.0   Chloride 98 - 111 mmol/L 109  106  108   CO2 22 - 32 mmol/L 24  25  21    Calcium 8.9 - 10.3 mg/dL 9.6  9.6  8.3   Total Protein 6.5 - 8.1 g/dL 7.6  7.5    Total Bilirubin 0.3 - 1.2 mg/dL 0.4  0.4    Alkaline Phos 38 - 126 U/L 51  66    AST 15 - 41 U/L 18  19    ALT 0 - 44 U/L 13  15     . Lab Results  Component Value Date   LDH 235 (H) 05/12/2022       02/08/2019 Flow Pathology:     WBC  Lymphs Abs   12/05/2015 10.1K 3.5K  12/10/2017 12.0K 7.0K  12/15/2018 14.9K 8.0K   RADIOGRAPHIC STUDIES: I have personally reviewed the radiological images as listed and agreed with the findings in the report. No results found.   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 CBC shows elevated WBC of 21.2 K, slightly decreased hemoglobin of 11.7 g/dL, and slightly decreased hematocrit of 34.5%, stable overall. CMP is pending.  -Answered all of patient's questions.  -Notes no clinical lymphadenopathy or splenomegaly. -patient has no lab or clinical evidence of significant CLL progression at this time. -no indication to initial CLL directed treatment at this time.  FOLLOW UP: RTC with Dr Candise Che with labs in 6 months   The total time spent in the appointment was 20 minutes* .  All of the patient's questions were answered with apparent satisfaction. The patient knows to call the clinic with any problems, questions or concerns.   Wyvonnia Lora MD MS AAHIVMS [facility] Hematology/Oncology Physician [facility]  .*Total Encounter Time as defined by the Centers for Medicare and Medicaid Services includes, in addition to the face-to-face time of a patient  visit (documented in the note above) non-face-to-face time: obtaining and reviewing outside history, ordering and reviewing medications, tests or procedures, care coordination (communications with other health care professionals or caregivers) and documentation in the medical record.   I,Param Shah,acting as a Neurosurgeon for Wyvonnia Lora, MD.,have documented all relevant documentation on the behalf of Wyvonnia Lora, MD,as directed by  Wyvonnia Lora, MD while in the presence of Wyvonnia Lora, MD.   .I have reviewed the above documentation for accuracy and completeness, and I agree with the above. Johney Maine MD

## 2022-11-21 DIAGNOSIS — R051 Acute cough: Secondary | ICD-10-CM | POA: Diagnosis not present

## 2022-11-21 DIAGNOSIS — C9110 Chronic lymphocytic leukemia of B-cell type not having achieved remission: Secondary | ICD-10-CM | POA: Diagnosis not present

## 2022-11-21 DIAGNOSIS — I129 Hypertensive chronic kidney disease with stage 1 through stage 4 chronic kidney disease, or unspecified chronic kidney disease: Secondary | ICD-10-CM | POA: Diagnosis not present

## 2022-11-21 DIAGNOSIS — N1832 Chronic kidney disease, stage 3b: Secondary | ICD-10-CM | POA: Diagnosis not present

## 2022-11-21 DIAGNOSIS — I700 Atherosclerosis of aorta: Secondary | ICD-10-CM | POA: Diagnosis not present

## 2022-11-21 DIAGNOSIS — N401 Enlarged prostate with lower urinary tract symptoms: Secondary | ICD-10-CM | POA: Diagnosis not present

## 2022-11-21 DIAGNOSIS — Z952 Presence of prosthetic heart valve: Secondary | ICD-10-CM | POA: Diagnosis not present

## 2022-11-21 DIAGNOSIS — I2510 Atherosclerotic heart disease of native coronary artery without angina pectoris: Secondary | ICD-10-CM | POA: Diagnosis not present

## 2022-11-21 DIAGNOSIS — E1129 Type 2 diabetes mellitus with other diabetic kidney complication: Secondary | ICD-10-CM | POA: Diagnosis not present

## 2022-11-21 DIAGNOSIS — I6523 Occlusion and stenosis of bilateral carotid arteries: Secondary | ICD-10-CM | POA: Diagnosis not present

## 2022-11-21 DIAGNOSIS — E785 Hyperlipidemia, unspecified: Secondary | ICD-10-CM | POA: Diagnosis not present

## 2023-01-09 ENCOUNTER — Ambulatory Visit: Payer: Medicare Other | Admitting: Cardiovascular Disease

## 2023-01-16 ENCOUNTER — Ambulatory Visit: Payer: Medicare Other | Attending: Cardiovascular Disease | Admitting: Cardiovascular Disease

## 2023-01-16 ENCOUNTER — Encounter: Payer: Self-pay | Admitting: Cardiovascular Disease

## 2023-01-16 VITALS — BP 150/62 | HR 62 | Ht 71.0 in | Wt 178.0 lb

## 2023-01-16 DIAGNOSIS — Z952 Presence of prosthetic heart valve: Secondary | ICD-10-CM | POA: Diagnosis not present

## 2023-01-16 DIAGNOSIS — I4510 Unspecified right bundle-branch block: Secondary | ICD-10-CM | POA: Diagnosis not present

## 2023-01-16 DIAGNOSIS — I10 Essential (primary) hypertension: Secondary | ICD-10-CM | POA: Diagnosis not present

## 2023-01-16 DIAGNOSIS — E782 Mixed hyperlipidemia: Secondary | ICD-10-CM | POA: Diagnosis not present

## 2023-01-16 DIAGNOSIS — I2510 Atherosclerotic heart disease of native coronary artery without angina pectoris: Secondary | ICD-10-CM

## 2023-01-16 NOTE — Patient Instructions (Signed)
 Medication Instructions:  No changes *If you need a refill on your cardiac medications before your next appointment, please call your pharmacy*   Lab Work: none If you have labs (blood work) drawn today and your tests are completely normal, you will receive your results only by: MyChart Message (if you have MyChart) OR A paper copy in the mail If you have any lab test that is abnormal or we need to change your treatment, we will call you to review the results.   Testing/Procedures: none   Follow-Up: At [facility], you and your health needs are our priority.  As part of our continuing mission to provide you with exceptional heart care, we have created designated Provider Care Teams.  These Care Teams include your primary Cardiologist (physician) and Advanced Practice Providers (APPs -  Physician Assistants and Nurse Practitioners) who all work together to provide you with the care you need, when you need it.   Your next appointment:   6 month(s)  Provider:   Lonni Cash, MD     Other Instructions   1st Floor: - Lobby - Registration  - Pharmacy  - Lab - Cafe  2nd Floor: - PV Lab - Diagnostic Testing (echo, CT, nuclear med)  3rd Floor: - Vacant  4th Floor: - TCTS (cardiothoracic surgery) - AFib Clinic - Structural Heart Clinic - Vascular Surgery  - Vascular Ultrasound  5th Floor: - HeartCare Cardiology (general and EP) - Clinical Pharmacy for coumadin, hypertension, lipid, weight-loss medications, and med management appointments    Valet parking services will be available as well.

## 2023-01-16 NOTE — Progress Notes (Signed)
 Chief Complaint  Patient presents with   Follow-up    CAD, aortic valve disease   History of Present Illness: 83 yo male with history of severe aortic stenosis s/p TAVR in 2021, CAD, HLD, HTN, DM, CKD stage3 and RBBB here today for cardiac follow up. He has been followed by Dr. Claudene.  He is known to have CAD with MI in 1995 and PCI in 2001. Cardiac cath November 2021 with patent mid LAD stent, patent mid Circumflex stent into the OM, moderate eccentric mid to distal RCA stenosis not flow limiting by flow wire assessemnt (RFR 0.97). He had severe aortic stenosis and underwent TAVR in December 2021 with placement of a  23 mm Edwards Sapien 3 Ultra THV via the transfemoral approach. Echo November 2022 with normal LV function and normally functioning AVR. Carotid artery dopplers December 2023 with mild bilateral disease.   He is here today for follow up. The patient denies any chest pain, dyspnea, palpitations, lower extremity edema, orthopnea, PND, dizziness, near syncope or syncope.   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 Verlin and Dr. Darrol    Severe aortic stenosis    Squamous cell carcinoma of skin 05/13/2016   bridge of nose ([id number])   Type 2 diabetes mellitus (HCC)     Past Surgical History:  Procedure  Laterality Date   APPENDECTOMY     CARDIAC CATHETERIZATION  2021   cardiac stents     CARPAL TUNNEL RELEASE Left 12/09/2016   Procedure: CARPAL TUNNEL RELEASE;  Surgeon: Murrell Kuba, MD;  Location: [facility];  Service: Orthopedics;  Laterality: Left;   CARPAL TUNNEL WITH CUBITAL TUNNEL Right 01/13/2017   Procedure: RIGHT CARPAL TUNNEL WITH CUBITAL TUNNEL RELEASE;  Surgeon: Murrell Kuba, MD;  Location: [facility];  Service: Orthopedics;  Laterality: Right;   CORONARY PRESSURE/FFR STUDY N/A 11/15/2019   Procedure: INTRAVASCULAR PRESSURE WIRE/FFR STUDY;  Surgeon: Verlin Lonni BIRCH, MD;  Location: [facility];  Service: Cardiovascular;  Laterality: N/A;   EYE SURGERY Bilateral 2019   Dr. Roz   RIGHT/LEFT HEART CATH AND CORONARY ANGIOGRAPHY N/A 02/02/2019   Procedure: RIGHT/LEFT HEART CATH AND CORONARY ANGIOGRAPHY;  Surgeon: Claudene Victory ORN, MD;  Location: [facility];  Service: Cardiovascular;  Laterality: N/A;   RIGHT/LEFT HEART CATH AND CORONARY ANGIOGRAPHY N/A 11/15/2019   Procedure: RIGHT/LEFT HEART CATH AND CORONARY ANGIOGRAPHY;  Surgeon: Verlin Lonni BIRCH, MD;  Location: [facility];  Service: Cardiovascular;  Laterality: N/A;   TEE WITHOUT CARDIOVERSION N/A 12/13/2019   Procedure: TRANSESOPHAGEAL ECHOCARDIOGRAM (TEE);  Surgeon: Verlin Lonni BIRCH, MD;  Location: [facility];  Service: Open Heart Surgery;  Laterality: N/A;   TRANSCATHETER  AORTIC VALVE REPLACEMENT, TRANSFEMORAL  12/13/2019   TRANSCATHETER AORTIC VALVE REPLACEMENT, TRANSFEMORAL N/A 12/13/2019   Procedure: TRANSCATHETER AORTIC VALVE REPLACEMENT, TRANSFEMORAL;  Surgeon: Verlin Lonni BIRCH, MD;  Location: [facility];  Service: Open Heart Surgery;  Laterality: N/A;   TRIGGER FINGER RELEASE Left 08/10/2018   Procedure: RELEASE TRIGGER FINGER/A-1 PULLEY THUMB AND LEFT SMALL;  Surgeon: Murrell Kuba, MD;  Location: [facility];  Service: Orthopedics;  Laterality: Left;   FAB   ULNAR NERVE TRANSPOSITION Left 12/09/2016   Procedure: DECOMPRESSION LEFT ULNAR NERVE;  Surgeon: Murrell Kuba, MD;  Location: [facility];  Service: Orthopedics;  Laterality: Left;   ULNAR NERVE TRANSPOSITION Right 01/13/2017   Procedure: DECOMPRESSION ULNAR NERVE;  Surgeon: Murrell Kuba, MD;  Location: [facility];  Service: Orthopedics;  Laterality: Right;    Current Outpatient Medications  Medication Sig Dispense Refill   ACTOS  45 MG tablet Take 45 mg by mouth daily with supper.   1   amoxicillin  (AMOXIL ) 500 MG capsule Take 4 capsules (2,000 mg total) one hour prior to all dental visits. 24 capsule 3   aspirin  EC 81 MG tablet Take 1 tablet (81 mg total) by mouth daily. 90 tablet 3   Cholecalciferol (VITAMIN D3 SUPER STRENGTH) 50 MCG (2000 UT) TABS Take 2,000 Units by mouth daily.     Dulaglutide (TRULICITY) 0.75 MG/0.5ML SOPN      empagliflozin (JARDIANCE) 10 MG TABS tablet Take 10 mg by mouth daily.      ezetimibe -simvastatin  (VYTORIN ) 10-20 MG tablet Take 1 tablet by mouth daily. 90 tablet 3   Glucosamine-Chondroitin (GLUCOSAMINE CHONDR COMPLEX PO) Take 1 capsule by mouth 2 (two) times daily. 500 mgs     glyBURIDE -metformin  (GLUCOVANCE ) 5-500 MG per tablet Take 2 tablets by mouth 2 (two) times daily with a meal.     metoprolol  succinate (TOPROL -XL) 50 MG 24 hr tablet Take 50 mg by mouth at bedtime. Take with or immediately following a meal.     Multiple Vitamin (MULTIVITAMIN WITH MINERALS) TABS tablet Take 1 tablet by mouth daily. Centrum silver     omega-3 acid ethyl esters (LOVAZA ) 1 G capsule Take 1 g by mouth in the morning, at noon, and at bedtime.      omeprazole  (PRILOSEC) 20 MG capsule Take 1 capsule (20 mg total) by mouth 2 (two) times a week. 30 capsule    ramipril  (ALTACE ) 10 MG capsule Take 10 mg by mouth daily with supper.      No current facility-administered medications for this visit.    No Known Allergies  Social History    Socioeconomic History   Marital status: Married    Spouse name: Not on file   Number of children: 2   Years of education: Not on file   Highest education level: Not on file  Occupational History   Occupation: Retired clinical research associate  Tobacco Use   Smoking status: Former    Current packs/day: 0.00    Average packs/day: 1 pack/day for 20.0 years (20.0 ttl pk-yrs)    Types: Cigarettes    Start date: 63    Quit date: 1988    Years since quitting: 37.0   Smokeless tobacco: Never  Vaping Use   Vaping status: Never Used  Substance and Sexual Activity   Alcohol use: Yes    Alcohol/week: 0.0 standard drinks of alcohol    Comment: social   Drug use: No   Sexual activity: Yes  Other Topics Concern   Not on  file  Social History Narrative   Not on file   Social Drivers of Health   Financial Resource Strain: Not on file  Food Insecurity: Not on file  Transportation Needs: Not on file  Physical Activity: Not on file  Stress: Not on file  Social Connections: Not on file  Intimate Partner Violence: Not on file    Family History  Problem Relation Age of Onset   Ovarian cancer Mother 72   Heart failure Father    Cancer Paternal Uncle        Karposi Sarcoma   Multiple myeloma Sister 8    Review of Systems:  As stated in the HPI and otherwise negative.   BP (!) 150/62 (BP Location: Left Arm, Patient Position: Sitting, Cuff Size: Normal)   Pulse 62   Ht 5' 11 (1.803 m)   Wt 80.7 kg   SpO2 97%   BMI 24.83 kg/m   Physical Examination: General: Well developed, well nourished, NAD  HEENT: OP clear, mucus membranes moist  SKIN: warm, dry. No rashes. Neuro: No focal deficits  Musculoskeletal: Muscle strength 5/5 all ext  Psychiatric: Mood and affect normal  Neck: No JVD, no carotid bruits, no thyromegaly, no lymphadenopathy.  Lungs:Clear bilaterally, no wheezes, rhonci, crackles Cardiovascular: Regular rate and rhythm. No murmurs, gallops or rubs. Abdomen:Soft. Bowel sounds  present. Non-tender.  Extremities: No lower extremity edema. Pulses are 2 + in the bilateral DP/PT.  EKG:  EKG is ordered today.  The ekg ordered today demonstrates  EKG Interpretation Date/Time:  Friday January 16 2023 12:09:17 EST Ventricular Rate:  63 PR Interval:  162 QRS Duration:  128 QT Interval:  404 QTC Calculation: 413 R Axis:   -26  Text Interpretation: Normal sinus rhythm Right bundle branch block Minimal voltage criteria for LVH, may be normal variant ( R in aVL ) When compared with ECG of 14-Dec-2019 05:03, No significant change was found Confirmed by Verlin Bruckner [phone]) on 01/16/2023 12:14:37 PM    Echo November 2022:  1. Left ventricular ejection fraction, by estimation, is 60 to 65%. The  left ventricle has normal function. The left ventricle has no regional  wall motion abnormalities. Left ventricular diastolic parameters are  indeterminate.   2. Right ventricular systolic function is normal. The right ventricular  size is normal.   3. The mitral valve is normal in structure. Trivial mitral valve  regurgitation. No evidence of mitral stenosis.   4. The aortic valve has been repaired/replaced. Aortic valve  regurgitation is not visualized. There is a 23 mm Sapien prosthetic (TAVR)  valve present in the aortic position. Procedure Date: 12/23/2019. Echo  findings are consistent with normal structure  and function of the aortic valve prosthesis. Aortic valve area, by VTI  measures 1.21 cm. Aortic valve mean gradient measures 17.0 mmHg. Aortic  valve Vmax measures 2.90 m/s.   Recent Labs: 11/10/2022: ALT 16; BUN 27; Creatinine 1.61; Hemoglobin 11.7; Platelet Count 220; Potassium 4.7; Sodium 139   Lipid Panel    Component Value Date/Time   CHOL 83 (L) 01/24/2015 0949   TRIG 49 01/24/2015 0949   HDL 38 (L) 01/24/2015 0949   CHOLHDL 2.2 01/24/2015 0949   VLDL 10 01/24/2015 0949   LDLCALC 35 01/24/2015 0949     Wt Readings from Last 3 Encounters:   01/16/23 80.7 kg  11/10/22 84.1 kg  06/30/22 82.8 kg    Assessment and Plan:   1. CAD without angina: No chest pain suggestive of angina. Continue  ASA, beta blocker and statin.   2. Aortic valve disease: He is s/p TAVR for severe aortic stenosis in December 2021. Will continue ASA. SBE prophylaxis as indicated.   3. HTN: BP is controlled. No changes.   4. Hyperlipidemia: Lipids followed in primary care. Continue Vytorin .    5. Carotid artery disease: Mild disease by dopplers December 2023.   Labs/ tests ordered today include:   Orders Placed This Encounter  Procedures   EKG 12-Lead   Disposition:   F/U with me in 6 months  Signed, Lonni Cash, MD, [facility] 01/16/2023 1:26 PM    [facility] [address] [place], [place], [place]  [address] Phone: [phone]; Fax: [phone]

## 2023-01-31 DIAGNOSIS — R5383 Other fatigue: Secondary | ICD-10-CM | POA: Diagnosis not present

## 2023-01-31 DIAGNOSIS — R051 Acute cough: Secondary | ICD-10-CM | POA: Diagnosis not present

## 2023-01-31 DIAGNOSIS — R0981 Nasal congestion: Secondary | ICD-10-CM | POA: Diagnosis not present

## 2023-01-31 DIAGNOSIS — J101 Influenza due to other identified influenza virus with other respiratory manifestations: Secondary | ICD-10-CM | POA: Diagnosis not present

## 2023-02-06 ENCOUNTER — Other Ambulatory Visit: Payer: Self-pay | Admitting: Physician Assistant

## 2023-03-04 DIAGNOSIS — N401 Enlarged prostate with lower urinary tract symptoms: Secondary | ICD-10-CM | POA: Diagnosis not present

## 2023-03-04 DIAGNOSIS — E785 Hyperlipidemia, unspecified: Secondary | ICD-10-CM | POA: Diagnosis not present

## 2023-03-04 DIAGNOSIS — I700 Atherosclerosis of aorta: Secondary | ICD-10-CM | POA: Diagnosis not present

## 2023-03-04 DIAGNOSIS — R918 Other nonspecific abnormal finding of lung field: Secondary | ICD-10-CM | POA: Diagnosis not present

## 2023-03-04 DIAGNOSIS — I2510 Atherosclerotic heart disease of native coronary artery without angina pectoris: Secondary | ICD-10-CM | POA: Diagnosis not present

## 2023-03-04 DIAGNOSIS — G5600 Carpal tunnel syndrome, unspecified upper limb: Secondary | ICD-10-CM | POA: Diagnosis not present

## 2023-03-04 DIAGNOSIS — Z952 Presence of prosthetic heart valve: Secondary | ICD-10-CM | POA: Diagnosis not present

## 2023-03-04 DIAGNOSIS — N1832 Chronic kidney disease, stage 3b: Secondary | ICD-10-CM | POA: Diagnosis not present

## 2023-03-04 DIAGNOSIS — E1129 Type 2 diabetes mellitus with other diabetic kidney complication: Secondary | ICD-10-CM | POA: Diagnosis not present

## 2023-03-04 DIAGNOSIS — I6523 Occlusion and stenosis of bilateral carotid arteries: Secondary | ICD-10-CM | POA: Diagnosis not present

## 2023-03-04 DIAGNOSIS — C9110 Chronic lymphocytic leukemia of B-cell type not having achieved remission: Secondary | ICD-10-CM | POA: Diagnosis not present

## 2023-03-04 DIAGNOSIS — I129 Hypertensive chronic kidney disease with stage 1 through stage 4 chronic kidney disease, or unspecified chronic kidney disease: Secondary | ICD-10-CM | POA: Diagnosis not present

## 2023-03-06 DIAGNOSIS — L853 Xerosis cutis: Secondary | ICD-10-CM | POA: Diagnosis not present

## 2023-03-06 DIAGNOSIS — D2262 Melanocytic nevi of left upper limb, including shoulder: Secondary | ICD-10-CM | POA: Diagnosis not present

## 2023-03-06 DIAGNOSIS — D225 Melanocytic nevi of trunk: Secondary | ICD-10-CM | POA: Diagnosis not present

## 2023-03-06 DIAGNOSIS — L814 Other melanin hyperpigmentation: Secondary | ICD-10-CM | POA: Diagnosis not present

## 2023-03-06 DIAGNOSIS — L570 Actinic keratosis: Secondary | ICD-10-CM | POA: Diagnosis not present

## 2023-03-06 DIAGNOSIS — D1801 Hemangioma of skin and subcutaneous tissue: Secondary | ICD-10-CM | POA: Diagnosis not present

## 2023-03-06 DIAGNOSIS — Z85828 Personal history of other malignant neoplasm of skin: Secondary | ICD-10-CM | POA: Diagnosis not present

## 2023-03-06 DIAGNOSIS — L821 Other seborrheic keratosis: Secondary | ICD-10-CM | POA: Diagnosis not present

## 2023-03-09 DIAGNOSIS — E1169 Type 2 diabetes mellitus with other specified complication: Secondary | ICD-10-CM | POA: Diagnosis not present

## 2023-03-09 DIAGNOSIS — Z79899 Other long term (current) drug therapy: Secondary | ICD-10-CM | POA: Diagnosis not present

## 2023-03-09 DIAGNOSIS — E559 Vitamin D deficiency, unspecified: Secondary | ICD-10-CM | POA: Diagnosis not present

## 2023-03-09 DIAGNOSIS — I2510 Atherosclerotic heart disease of native coronary artery without angina pectoris: Secondary | ICD-10-CM | POA: Diagnosis not present

## 2023-03-09 DIAGNOSIS — C9110 Chronic lymphocytic leukemia of B-cell type not having achieved remission: Secondary | ICD-10-CM | POA: Diagnosis not present

## 2023-03-09 DIAGNOSIS — N183 Chronic kidney disease, stage 3 unspecified: Secondary | ICD-10-CM | POA: Diagnosis not present

## 2023-03-09 DIAGNOSIS — I6523 Occlusion and stenosis of bilateral carotid arteries: Secondary | ICD-10-CM | POA: Diagnosis not present

## 2023-03-09 DIAGNOSIS — E782 Mixed hyperlipidemia: Secondary | ICD-10-CM | POA: Diagnosis not present

## 2023-03-09 DIAGNOSIS — R972 Elevated prostate specific antigen [PSA]: Secondary | ICD-10-CM | POA: Diagnosis not present

## 2023-03-09 DIAGNOSIS — L859 Epidermal thickening, unspecified: Secondary | ICD-10-CM | POA: Diagnosis not present

## 2023-03-24 DIAGNOSIS — H353132 Nonexudative age-related macular degeneration, bilateral, intermediate dry stage: Secondary | ICD-10-CM | POA: Diagnosis not present

## 2023-03-24 DIAGNOSIS — Z961 Presence of intraocular lens: Secondary | ICD-10-CM | POA: Diagnosis not present

## 2023-04-06 ENCOUNTER — Ambulatory Visit: Payer: Medicare Other | Admitting: Cardiovascular Disease

## 2023-05-04 ENCOUNTER — Other Ambulatory Visit: Payer: Self-pay | Admitting: Cardiovascular Disease

## 2023-05-11 ENCOUNTER — Other Ambulatory Visit: Payer: Self-pay | Admitting: *Deleted

## 2023-05-11 ENCOUNTER — Ambulatory Visit: Payer: Medicare Other | Admitting: Hematology

## 2023-05-11 ENCOUNTER — Other Ambulatory Visit: Payer: Medicare Other

## 2023-05-11 DIAGNOSIS — C9110 Chronic lymphocytic leukemia of B-cell type not having achieved remission: Secondary | ICD-10-CM

## 2023-05-19 ENCOUNTER — Inpatient Hospital Stay: Payer: Medicare Other | Attending: Hematology

## 2023-05-19 ENCOUNTER — Inpatient Hospital Stay (HOSPITAL_BASED_OUTPATIENT_CLINIC_OR_DEPARTMENT_OTHER): Payer: Medicare Other | Admitting: Hematology

## 2023-05-19 VITALS — BP 119/65 | HR 67 | Temp 97.3°F | Resp 18 | Wt 183.2 lb

## 2023-05-19 DIAGNOSIS — C9110 Chronic lymphocytic leukemia of B-cell type not having achieved remission: Secondary | ICD-10-CM | POA: Diagnosis not present

## 2023-05-19 DIAGNOSIS — L111 Transient acantholytic dermatosis [Grover]: Secondary | ICD-10-CM | POA: Diagnosis not present

## 2023-05-19 LAB — CBC WITH DIFFERENTIAL (CANCER CENTER ONLY)
Abs Immature Granulocytes: 0.03 10*3/uL (ref 0.00–0.07)
Basophils Absolute: 0.1 10*3/uL (ref 0.0–0.1)
Basophils Relative: 0 %
Eosinophils Absolute: 0.2 10*3/uL (ref 0.0–0.5)
Eosinophils Relative: 1 %
HCT: 33.5 % — ABNORMAL LOW (ref 39.0–52.0)
Hemoglobin: 11.4 g/dL — ABNORMAL LOW (ref 13.0–17.0)
Immature Granulocytes: 0 %
Lymphocytes Relative: 76 %
Lymphs Abs: 16 10*3/uL — ABNORMAL HIGH (ref 0.7–4.0)
MCH: 30.5 pg (ref 26.0–34.0)
MCHC: 34 g/dL (ref 30.0–36.0)
MCV: 89.6 fL (ref 80.0–100.0)
Monocytes Absolute: 0.6 10*3/uL (ref 0.1–1.0)
Monocytes Relative: 3 %
Neutro Abs: 4.3 10*3/uL (ref 1.7–7.7)
Neutrophils Relative %: 20 %
Platelet Count: 194 10*3/uL (ref 150–400)
RBC: 3.74 MIL/uL — ABNORMAL LOW (ref 4.22–5.81)
RDW: 14 % (ref 11.5–15.5)
Smear Review: NORMAL
WBC Count: 21.2 10*3/uL — ABNORMAL HIGH (ref 4.0–10.5)
nRBC: 0 % (ref 0.0–0.2)

## 2023-05-19 LAB — CMP (CANCER CENTER ONLY)
ALT: 15 U/L (ref 0–44)
AST: 21 U/L (ref 15–41)
Albumin: 4.3 g/dL (ref 3.5–5.0)
Alkaline Phosphatase: 48 U/L (ref 38–126)
Anion gap: 4 — ABNORMAL LOW (ref 5–15)
BUN: 25 mg/dL — ABNORMAL HIGH (ref 8–23)
CO2: 26 mmol/L (ref 22–32)
Calcium: 9.1 mg/dL (ref 8.9–10.3)
Chloride: 110 mmol/L (ref 98–111)
Creatinine: 1.52 mg/dL — ABNORMAL HIGH (ref 0.61–1.24)
GFR, Estimated: 45 mL/min — ABNORMAL LOW (ref >60)
Glucose, Bld: 125 mg/dL — ABNORMAL HIGH (ref 70–99)
Potassium: 4.3 mmol/L (ref 3.5–5.1)
Sodium: 140 mmol/L (ref 135–145)
Total Bilirubin: 0.4 mg/dL (ref 0.0–1.2)
Total Protein: 7 g/dL (ref 6.5–8.1)

## 2023-05-19 LAB — LACTATE DEHYDROGENASE: LDH: 241 U/L — ABNORMAL HIGH (ref 98–192)

## 2023-05-19 NOTE — Progress Notes (Signed)
 HEMATOLOGY/ONCOLOGY CLINIC NOTE  Date of Service: 05/19/2023  Patient Care Team: Benedetta Bradley, MD as PCP - General (Internal Medicine) Odie Benne, MD as PCP - Cardiology (Cardiology) Devon Fogo, MD (Inactive) as Consulting Physician (Dermatology)  CHIEF COMPLAINTS/PURPOSE OF CONSULTATION:   follow-up for continued evaluation and management of chronic lymphocytic leukemia.  HISTORY OF PRESENTING ILLNESS: 02/2009  Barry Zavala is a wonderful 83 y.o. male who has been referred to us  by Dr Berta Brittle for evaluation and management of leukocytosis (possible lymphoma). We are joined by his wife, Jolly Needle, via phone. The pt reports that he is doing well overall.  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 He went to see his Dermatologist, in the summer, who prescribed 5% Betamethasone  Dipropionate. He has been having to use this cream over a large portion of his body. His rash has improved but has not gone away.    He received his first dose of the COVID19 vaccine on 01/14/2019. His WBC was around 17K at this time. He saw his Cardiologist, Dr. Felipe Horton, on 02/02/2019 and found that his WBC had decreased to 13K. He received the second dose of the COVID19 vaccine on 02/04/2019.   He has not felt any different in the last 3-6 months. His other chronic conditions are currently being well managed. Pt golfs 4-5 days per week if the weather allows. He has been on Jardiance for some time and has not needed to be on steroids for any reason within the last 6 months. Pt was a previous smoker and quit in 1988. He does not drink any alcohol. Pt is a retired Engineer, structural. Pt has had some previous dental issues that were corrected by a Periodontist. He denies any current issues with his gums. Pt has never needed a blood transfusion and has not had any tattoos. He has benign prostatitis and there have been recent elevations in PSA levels. His sister passed from Multiple  Myeloma but he is not aware of any other blood disorder or cancers.   Most recent lab results (02/02/2019) of CBC and BMP is as follows: all values are WNL except for WBC at 13.9K, RBC at 4.05, Hgb at 12.5, HCT at 38.1  On review of systems, pt denies fevers, chills, night sweats, unexpected weight loss, abdominal pain, chest pain, SOB, new lumps/bumps, sore throat, rhinorrhea, dysuria, hematuria, difficulty starting urine stream, forcing urine stream and any other symptoms.   On PMHx the pt reports Benign Prostatitis, CAD, Elevated PSA, GERD, HLD, HTN, Type 2 Diabetes. On Social Hx the pt reports that he is a previous smoker (quit in 1988), does not drink any alcohol. He is a retired Clinical research associate. On Family Hx the pt reports that he had a sister who passed from Multiple Myeloma  INTERVAL HISTORY:   [name] is a 83 y.o. male here for continued evaluation and management of his CLL.   Patient was last seen by me on 11/10/2022 and he was doing well overall with no new medical complaints.  Patient is here for his 56-month follow-up for CLL and notes that he has been doing well overall.  Notes some mild grade 1 fatigue but no other acute new focal symptoms.  No unexplained fevers chills night sweats or unexpected weight loss.  No new lumps or bumps.  No new chest pain or shortness of breath.  No abdominal pain or distention. Overall in good spirits.  Plans to travel to [place] this summer. Labs  done today were reviewed with him in details.   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 Abel Hoe and Dr. 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   bridge of nose ([id number])   Type 2 diabetes mellitus (HCC)     SURGICAL HISTORY: Past Surgical History:  Procedure Laterality Date   APPENDECTOMY     CARDIAC CATHETERIZATION  2021   cardiac stents     CARPAL TUNNEL RELEASE Left 12/09/2016   Procedure: CARPAL TUNNEL RELEASE;  Surgeon: Lyanne Sample, MD;  Location: [facility];  Service: Orthopedics;  Laterality: Left;   CARPAL TUNNEL WITH CUBITAL TUNNEL Right 01/13/2017   Procedure: RIGHT CARPAL TUNNEL WITH CUBITAL TUNNEL RELEASE;  Surgeon: Lyanne Sample, MD;  Location: [facility];  Service: Orthopedics;  Laterality: Right;   CORONARY PRESSURE/FFR STUDY N/A 11/15/2019   Procedure: INTRAVASCULAR PRESSURE WIRE/FFR STUDY;  Surgeon: Odie Benne, MD;  Location: [facility];  Service: Cardiovascular;  Laterality: N/A;   EYE SURGERY Bilateral 2019   Dr. Gennie Kicks   RIGHT/LEFT HEART CATH AND CORONARY ANGIOGRAPHY N/A 02/02/2019   Procedure: RIGHT/LEFT HEART CATH AND CORONARY ANGIOGRAPHY;  Surgeon: Arty Binning, MD;  Location: [facility];  Service: Cardiovascular;  Laterality: N/A;   RIGHT/LEFT HEART CATH AND CORONARY ANGIOGRAPHY N/A 11/15/2019   Procedure: RIGHT/LEFT HEART CATH AND CORONARY ANGIOGRAPHY;  Surgeon: Odie Benne, MD;  Location: [facility];  Service: Cardiovascular;  Laterality: N/A;   TEE WITHOUT CARDIOVERSION N/A 12/13/2019   Procedure: TRANSESOPHAGEAL ECHOCARDIOGRAM (TEE);  Surgeon: Odie Benne, MD;  Location: [facility];  Service: Open Heart Surgery;  Laterality: N/A;   TRANSCATHETER AORTIC VALVE REPLACEMENT, TRANSFEMORAL  12/13/2019    TRANSCATHETER AORTIC VALVE REPLACEMENT, TRANSFEMORAL N/A 12/13/2019   Procedure: TRANSCATHETER AORTIC VALVE REPLACEMENT, TRANSFEMORAL;  Surgeon: Odie Benne, MD;  Location: [facility];  Service: Open Heart Surgery;  Laterality: N/A;   TRIGGER FINGER RELEASE Left 08/10/2018   Procedure: RELEASE TRIGGER FINGER/A-1 PULLEY THUMB AND LEFT SMALL;  Surgeon: Lyanne Sample, MD;  Location: [facility];  Service: Orthopedics;  Laterality: Left;  FAB   ULNAR NERVE TRANSPOSITION Left 12/09/2016   Procedure: DECOMPRESSION LEFT ULNAR NERVE;  Surgeon: Lyanne Sample, MD;  Location: [facility];  Service: Orthopedics;  Laterality: Left;   ULNAR NERVE TRANSPOSITION Right 01/13/2017   Procedure: DECOMPRESSION ULNAR NERVE;  Surgeon: Lyanne Sample, MD;  Location: [facility];  Service: Orthopedics;  Laterality: Right;    SOCIAL HISTORY: Social History   Socioeconomic History   Marital status: Married    Spouse name: Not on file   Number of children: 2   Years of education:  Not on file   Highest education level: Not on file  Occupational History   Occupation: Retired Clinical research associate  Tobacco Use   Smoking status: Former    Current packs/day: 0.00    Average packs/day: 1 pack/day for 20.0 years (20.0 ttl pk-yrs)    Types: Cigarettes    Start date: 28    Quit date: 1988    Years since quitting: 37.3   Smokeless tobacco: Never  Vaping Use   Vaping status: Never Used  Substance and Sexual Activity   Alcohol use: Yes    Alcohol/week: 0.0 standard drinks of alcohol    Comment: social   Drug use: No   Sexual activity: Yes  Other Topics Concern   Not on file  Social History Narrative   Not on file   Social Drivers of Health   Financial Resource Strain: Not on file  Food Insecurity: Not on file  Transportation Needs: Not on file  Physical Activity: Not on file  Stress: Not on file  Social Connections: Not on file  Intimate Partner Violence: Not on file    FAMILY  HISTORY: Family History  Problem Relation Age of Onset   Ovarian cancer Mother 93   Heart failure Father    Cancer Paternal Uncle        Karposi Sarcoma   Multiple myeloma Sister 20    ALLERGIES:  has no known allergies.  MEDICATIONS:  Current Outpatient Medications  Medication Sig Dispense Refill   ACTOS  45 MG tablet Take 45 mg by mouth daily with supper.   1   amoxicillin  (AMOXIL ) 500 MG capsule Take 4 capsules (2,000 mg total) one hour prior to all dental visits. 24 capsule 3   aspirin  EC 81 MG tablet Take 1 tablet (81 mg total) by mouth daily. 90 tablet 3   Cholecalciferol (VITAMIN D3 SUPER STRENGTH) 50 MCG (2000 UT) TABS Take 2,000 Units by mouth daily.     Dulaglutide (TRULICITY) 0.75 MG/0.5ML SOPN      empagliflozin (JARDIANCE) 10 MG TABS tablet Take 10 mg by mouth daily.      ezetimibe -simvastatin  (VYTORIN ) 10-20 MG tablet Take 1 tablet by mouth daily. Please keep scheduled appointment for future refills. Thank you. 90 tablet 0   Glucosamine-Chondroitin (GLUCOSAMINE CHONDR COMPLEX PO) Take 1 capsule by mouth 2 (two) times daily. 500 mgs     glyBURIDE -metformin  (GLUCOVANCE ) 5-500 MG per tablet Take 2 tablets by mouth 2 (two) times daily with a meal.     metoprolol  succinate (TOPROL -XL) 50 MG 24 hr tablet Take 50 mg by mouth at bedtime. Take with or immediately following a meal.     Multiple Vitamin (MULTIVITAMIN WITH MINERALS) TABS tablet Take 1 tablet by mouth daily. Centrum silver     omega-3 acid ethyl esters (LOVAZA ) 1 G capsule Take 1 g by mouth in the morning, at noon, and at bedtime.      omeprazole  (PRILOSEC) 20 MG capsule Take 1 capsule (20 mg total) by mouth 2 (two) times a week. 30 capsule    ramipril  (ALTACE ) 10 MG capsule Take 10 mg by mouth daily with supper.      No current facility-administered medications for this visit.    REVIEW OF SYSTEMS:    10 Point review of Systems was done is negative except as noted above.   PHYSICAL EXAMINATION: ECOG PERFORMANCE  STATUS: 1 - Symptomatic but completely ambulatory ..BP 119/65   Pulse 67   Temp (!) 97.3 F (36.3 C)   Resp 18  Wt 183 lb 3.2 oz (83.1 kg)   SpO2 98%   BMI 25.55 kg/m  GENERAL:alert, in no acute distress and comfortable SKIN: no acute rashes, no significant lesions EYES: conjunctiva are pink and non-injected, sclera anicteric OROPHARYNX: MMM, no exudates, no oropharyngeal erythema or ulceration NECK: supple, no JVD LYMPH:  no palpable lymphadenopathy in the cervical, axillary or inguinal regions LUNGS: clear to auscultation b/l with normal respiratory effort HEART: regular rate & rhythm ABDOMEN:  normoactive bowel sounds , non tender, not distended.  No palpable hepatosplenomegaly Extremity: no pedal edema PSYCH: alert & oriented x 3 with fluent speech NEURO: no focal motor/sensory deficits   LABORATORY DATA:  I have reviewed the data as listed  .    Latest Ref Rng & Units 05/19/2023   12:27 PM 11/10/2022   10:37 AM 05/12/2022   12:53 PM  CBC  WBC 4.0 - 10.5 K/uL 21.2  21.2  22.6   Hemoglobin 13.0 - 17.0 g/dL 16.1  09.6  04.5   Hematocrit 39.0 - 52.0 % 33.5  34.5  37.0   Platelets 150 - 400 K/uL 194  220  222    .    Latest Ref Rng & Units 05/19/2023   12:27 PM 11/10/2022   10:37 AM 05/12/2022   12:53 PM  CMP  Glucose 70 - 99 mg/dL 409  811  914   BUN 8 - 23 mg/dL 25  27  28    Creatinine 0.61 - 1.24 mg/dL 7.82  9.56  2.13   Sodium 135 - 145 mmol/L 140  139  139   Potassium 3.5 - 5.1 mmol/L 4.3  4.7  4.5   Chloride 98 - 111 mmol/L 110  109  109   CO2 22 - 32 mmol/L 26  26  24    Calcium 8.9 - 10.3 mg/dL 9.1  9.6  9.6   Total Protein 6.5 - 8.1 g/dL 7.0  7.3  7.6   Total Bilirubin 0.0 - 1.2 mg/dL 0.4  0.4  0.4   Alkaline Phos 38 - 126 U/L 48  67  51   AST 15 - 41 U/L 21  20  18    ALT 0 - 44 U/L 15  16  13     . Lab Results  Component Value Date   LDH 237 (H) 11/10/2022       02/08/2019 Flow Pathology:     WBC  Lymphs Abs   12/05/2015 10.1K 3.5K  12/10/2017  12.0K 7.0K  12/15/2018 14.9K 8.0K   RADIOGRAPHIC STUDIES: I have personally reviewed the radiological images as listed and agreed with the findings in the report. No results found.   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 - Patient was recommended to continue vitamin D and B complex. - He will follow-up with his primary care physician to stay up to speed with his age-appropriate vaccinations and cancer screenings. - Recommended adequate infection precautions during his upcoming travels.  FOLLOW UP:  Return to clinic with Dr. Salomon Cree with labs in 6 months  The total time spent in the appointment was 20 minutes* .  All of the patient's questions were answered with apparent satisfaction. The patient knows to call the clinic with any problems, questions or concerns.   Jacquelyn Matt MD MS AAHIVMS [facility] Hematology/Oncology Physician [facility]  .*Total Encounter Time as defined by the Centers for Medicare and Medicaid Services includes, in addition to the face-to-face time of a patient visit (documented in the note above) non-face-to-face time: obtaining and reviewing outside history, ordering and reviewing medications, tests or procedures, care coordination (communications with other health care professionals or caregivers) and documentation in the medical record.    I,Mitra Faeizi,acting as a Neurosurgeon for Jacquelyn Matt, MD.,have documented all relevant documentation on the  behalf of Jacquelyn Matt, MD,as directed by  Jacquelyn Matt, MD while in the presence of Jacquelyn Matt, MD.  .I have reviewed the above documentation for accuracy and completeness, and I agree with the above. .Altan Kraai Kishore Gwendloyn Forsee MD

## 2023-06-08 ENCOUNTER — Encounter: Payer: Self-pay | Admitting: Cardiovascular Disease

## 2023-06-08 ENCOUNTER — Ambulatory Visit: Payer: Medicare Other | Attending: Cardiovascular Disease | Admitting: Cardiovascular Disease

## 2023-06-08 VITALS — BP 124/62 | HR 72 | Ht 71.0 in | Wt 185.8 lb

## 2023-06-08 DIAGNOSIS — Z952 Presence of prosthetic heart valve: Secondary | ICD-10-CM | POA: Insufficient documentation

## 2023-06-08 DIAGNOSIS — E782 Mixed hyperlipidemia: Secondary | ICD-10-CM | POA: Insufficient documentation

## 2023-06-08 DIAGNOSIS — I2510 Atherosclerotic heart disease of native coronary artery without angina pectoris: Secondary | ICD-10-CM | POA: Diagnosis not present

## 2023-06-08 DIAGNOSIS — I10 Essential (primary) hypertension: Secondary | ICD-10-CM | POA: Insufficient documentation

## 2023-06-08 NOTE — Patient Instructions (Signed)
 Medication Instructions:  No changes *If you need a refill on your cardiac medications before your next appointment, please call your pharmacy*  Lab Work: none If you have labs (blood work) drawn today and your tests are completely normal, you will receive your results only by: MyChart Message (if you have MyChart) OR A paper copy in the mail If you have any lab test that is abnormal or we need to change your treatment, we will call you to review the results.  Testing/Procedures: none  Follow-Up: At [facility], you and your health needs are our priority.  As part of our continuing mission to provide you with exceptional heart care, our providers are all part of one team.  This team includes your primary Cardiologist (physician) and Advanced Practice Providers or APPs (Physician Assistants and Nurse Practitioners) who all work together to provide you with the care you need, when you need it.  Your next appointment:   6 month(s)  Provider:   Antoinette Batman, MD    We recommend signing up for the patient portal called "MyChart".  Sign up information is provided on this After Visit Summary.  MyChart is used to connect with patients for Virtual Visits (Telemedicine).  Patients are able to view lab/test results, encounter notes, upcoming appointments, etc.  Non-urgent messages can be sent to your provider as well.   To learn more about what you can do with MyChart, go to [email or web address].   Other Instructions

## 2023-06-08 NOTE — Progress Notes (Signed)
 Chief Complaint  Patient presents with   Follow-up    CAD   History of Present Illness: 83 yo male with history of severe aortic stenosis s/p TAVR in 2021, CAD, HLD, HTN, DM, CKD stage3 and RBBB here today for cardiac follow up. He has been followed by Dr. Felipe Horton.  He is known to have CAD with MI in 1995 and PCI in 2001. Cardiac cath November 2021 with patent mid LAD stent, patent mid Circumflex stent into the OM, moderate eccentric mid to distal RCA stenosis not flow limiting by flow wire assessemnt (RFR 0.97). He had severe aortic stenosis and underwent TAVR in December 2021 with placement of a  23 mm Edwards Sapien 3 Ultra THV via the transfemoral approach. Echo November 2022 with normal LV function and normally functioning AVR. Carotid artery dopplers December 2023 with mild bilateral disease.   He is here today for follow up. The patient denies any chest pain, dyspnea, palpitations, lower extremity edema, orthopnea, PND, dizziness, near syncope or syncope.   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 Abel Hoe and Dr. 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   bridge of nose ([id number])   Type 2 diabetes mellitus (HCC)     Past Surgical History:  Procedure Laterality Date    APPENDECTOMY     CARDIAC CATHETERIZATION  2021   cardiac stents     CARPAL TUNNEL RELEASE Left 12/09/2016   Procedure: CARPAL TUNNEL RELEASE;  Surgeon: Lyanne Sample, MD;  Location: [facility];  Service: Orthopedics;  Laterality: Left;   CARPAL TUNNEL WITH CUBITAL TUNNEL Right 01/13/2017   Procedure: RIGHT CARPAL TUNNEL WITH CUBITAL TUNNEL RELEASE;  Surgeon: Lyanne Sample, MD;  Location: [facility];  Service: Orthopedics;  Laterality: Right;   CORONARY PRESSURE/FFR STUDY N/A 11/15/2019   Procedure: INTRAVASCULAR PRESSURE WIRE/FFR STUDY;  Surgeon: Odie Benne, MD;  Location: [facility];  Service: Cardiovascular;  Laterality: N/A;   EYE SURGERY Bilateral 2019   Dr. Gennie Kicks   RIGHT/LEFT HEART CATH AND CORONARY ANGIOGRAPHY N/A 02/02/2019   Procedure: RIGHT/LEFT HEART CATH AND CORONARY ANGIOGRAPHY;  Surgeon: Arty Binning, MD;  Location: [facility];  Service: Cardiovascular;  Laterality: N/A;   RIGHT/LEFT HEART CATH AND CORONARY ANGIOGRAPHY N/A 11/15/2019   Procedure: RIGHT/LEFT HEART CATH AND CORONARY ANGIOGRAPHY;  Surgeon: Odie Benne, MD;  Location: [facility];  Service: Cardiovascular;  Laterality: N/A;   TEE WITHOUT CARDIOVERSION N/A 12/13/2019   Procedure: TRANSESOPHAGEAL ECHOCARDIOGRAM (TEE);  Surgeon: Odie Benne, MD;  Location: [facility];  Service: Open Heart Surgery;  Laterality: N/A;   TRANSCATHETER AORTIC VALVE REPLACEMENT,  TRANSFEMORAL  12/13/2019   TRANSCATHETER AORTIC VALVE REPLACEMENT, TRANSFEMORAL N/A 12/13/2019   Procedure: TRANSCATHETER AORTIC VALVE REPLACEMENT, TRANSFEMORAL;  Surgeon: Odie Benne, MD;  Location: [facility];  Service: Open Heart Surgery;  Laterality: N/A;   TRIGGER FINGER RELEASE Left 08/10/2018   Procedure: RELEASE TRIGGER FINGER/A-1 PULLEY THUMB AND LEFT SMALL;  Surgeon: Lyanne Sample, MD;  Location: [facility];  Service: Orthopedics;  Laterality: Left;  FAB   ULNAR  NERVE TRANSPOSITION Left 12/09/2016   Procedure: DECOMPRESSION LEFT ULNAR NERVE;  Surgeon: Lyanne Sample, MD;  Location: [facility];  Service: Orthopedics;  Laterality: Left;   ULNAR NERVE TRANSPOSITION Right 01/13/2017   Procedure: DECOMPRESSION ULNAR NERVE;  Surgeon: Lyanne Sample, MD;  Location: [facility];  Service: Orthopedics;  Laterality: Right;    Current Outpatient Medications  Medication Sig Dispense Refill   ACTOS  45 MG tablet Take 45 mg by mouth daily with supper.   1   amoxicillin  (AMOXIL ) 500 MG capsule Take 4 capsules (2,000 mg total) one hour prior to all dental visits. 24 capsule 3   aspirin  EC 81 MG tablet Take 1 tablet (81 mg total) by mouth daily. 90 tablet 3   Cholecalciferol (VITAMIN D3 SUPER STRENGTH) 50 MCG (2000 UT) TABS Take 2,000 Units by mouth daily.     Dulaglutide (TRULICITY) 0.75 MG/0.5ML SOPN      empagliflozin (JARDIANCE) 10 MG TABS tablet Take 10 mg by mouth daily.      ezetimibe -simvastatin  (VYTORIN ) 10-20 MG tablet Take 1 tablet by mouth daily. Please keep scheduled appointment for future refills. Thank you. 90 tablet 0   Glucosamine-Chondroitin (GLUCOSAMINE CHONDR COMPLEX PO) Take 1 capsule by mouth 2 (two) times daily. 500 mgs     glyBURIDE -metformin  (GLUCOVANCE ) 5-500 MG per tablet Take 2 tablets by mouth 2 (two) times daily with a meal.     metoprolol  succinate (TOPROL -XL) 50 MG 24 hr tablet Take 50 mg by mouth at bedtime. Take with or immediately following a meal.     Multiple Vitamin (MULTIVITAMIN WITH MINERALS) TABS tablet Take 1 tablet by mouth daily. Centrum silver     omega-3 acid ethyl esters (LOVAZA ) 1 G capsule Take 1 g by mouth in the morning, at noon, and at bedtime.      omeprazole  (PRILOSEC) 20 MG capsule Take 1 capsule (20 mg total) by mouth 2 (two) times a week. 30 capsule    ramipril  (ALTACE ) 10 MG capsule Take 10 mg by mouth daily with supper.      No current facility-administered medications for this visit.     No Known Allergies  Social History   Socioeconomic History   Marital status: Married    Spouse name: Not on file   Number of children: 2   Years of education: Not on file   Highest education level: Not on file  Occupational History   Occupation: Retired Clinical research associate  Tobacco Use   Smoking status: Former    Current packs/day: 0.00    Average packs/day: 1 pack/day for 20.0 years (20.0 ttl pk-yrs)    Types: Cigarettes    Start date: 8    Quit date: 1988    Years since quitting: 37.4   Smokeless tobacco: Never  Vaping Use   Vaping status: Never Used  Substance and Sexual Activity   Alcohol use: Yes    Alcohol/week: 0.0 standard drinks of alcohol    Comment: social   Drug use: No   Sexual activity: Yes  Other  Topics Concern   Not on file  Social History Narrative   Not on file   Social Drivers of Health   Financial Resource Strain: Not on file  Food Insecurity: Not on file  Transportation Needs: Not on file  Physical Activity: Not on file  Stress: Not on file  Social Connections: Not on file  Intimate Partner Violence: Not on file    Family History  Problem Relation Age of Onset   Ovarian cancer Mother 16   Heart failure Father    Cancer Paternal Uncle        Karposi Sarcoma   Multiple myeloma Sister 40    Review of Systems:  As stated in the HPI and otherwise negative.   BP 124/62   Pulse 72   Ht 5\' 11"  (1.803 m)   Wt 185 lb 12.8 oz (84.3 kg)   SpO2 95%   BMI 25.91 kg/m   Physical Examination: General: Well developed, well nourished, NAD  HEENT: OP clear, mucus membranes moist  SKIN: warm, dry. No rashes. Neuro: No focal deficits  Musculoskeletal: Muscle strength 5/5 all ext  Psychiatric: Mood and affect normal  Neck: No JVD, no carotid bruits, no thyromegaly, no lymphadenopathy.  Lungs:Clear bilaterally, no wheezes, rhonci, crackles Cardiovascular: Regular rate and rhythm. No murmurs, gallops or rubs. Abdomen:Soft. Bowel sounds present.  Non-tender.  Extremities: No lower extremity edema. Pulses are 2 + in the bilateral DP/PT.  EKG:  EKG is not ordered today.  The ekg ordered today demonstrates   Recent Labs: 05/19/2023: ALT 15; BUN 25; Creatinine 1.52; Hemoglobin 11.4; Platelet Count 194; Potassium 4.3; Sodium 140   Lipid Panel    Component Value Date/Time   CHOL 83 (L) 01/24/2015 0949   TRIG 49 01/24/2015 0949   HDL 38 (L) 01/24/2015 0949   CHOLHDL 2.2 01/24/2015 0949   VLDL 10 01/24/2015 0949   LDLCALC 35 01/24/2015 0949     Wt Readings from Last 3 Encounters:  06/08/23 185 lb 12.8 oz (84.3 kg)  05/19/23 183 lb 3.2 oz (83.1 kg)  01/16/23 178 lb (80.7 kg)    Assessment and Plan:   1. CAD without angina: No chest pain. Will continue ASA, statin and beta blocker.    2. Aortic valve disease: He is s/p TAVR for severe aortic stenosis in December 2021. Continue ASA.  SBE prophylaxis as indicated.   3. HTN: BP is well controlled. No changes today  4. Hyperlipidemia: Lipids followed in primary care. Continue Vytorin   5. Carotid artery disease: Mild disease by dopplers December 2023. Repeat dopplers in December 2026.   Labs/ tests ordered today include:   No orders of the defined types were placed in this encounter.  Disposition:   F/U with me in 6 months at pt request  Signed, Antoinette Batman, MD, [facility] 06/08/2023 4:51 PM    [facility] [address] [place], [place], [place]  [address] Phone: [phone]; Fax: [phone]

## 2023-07-06 DIAGNOSIS — I6523 Occlusion and stenosis of bilateral carotid arteries: Secondary | ICD-10-CM | POA: Diagnosis not present

## 2023-07-06 DIAGNOSIS — I700 Atherosclerosis of aorta: Secondary | ICD-10-CM | POA: Diagnosis not present

## 2023-07-06 DIAGNOSIS — Z952 Presence of prosthetic heart valve: Secondary | ICD-10-CM | POA: Diagnosis not present

## 2023-07-06 DIAGNOSIS — N1832 Chronic kidney disease, stage 3b: Secondary | ICD-10-CM | POA: Diagnosis not present

## 2023-07-06 DIAGNOSIS — E1129 Type 2 diabetes mellitus with other diabetic kidney complication: Secondary | ICD-10-CM | POA: Diagnosis not present

## 2023-07-06 DIAGNOSIS — I129 Hypertensive chronic kidney disease with stage 1 through stage 4 chronic kidney disease, or unspecified chronic kidney disease: Secondary | ICD-10-CM | POA: Diagnosis not present

## 2023-07-06 DIAGNOSIS — I2510 Atherosclerotic heart disease of native coronary artery without angina pectoris: Secondary | ICD-10-CM | POA: Diagnosis not present

## 2023-07-06 DIAGNOSIS — C9110 Chronic lymphocytic leukemia of B-cell type not having achieved remission: Secondary | ICD-10-CM | POA: Diagnosis not present

## 2023-07-06 DIAGNOSIS — R918 Other nonspecific abnormal finding of lung field: Secondary | ICD-10-CM | POA: Diagnosis not present

## 2023-07-06 DIAGNOSIS — E785 Hyperlipidemia, unspecified: Secondary | ICD-10-CM | POA: Diagnosis not present

## 2023-07-11 ENCOUNTER — Other Ambulatory Visit: Payer: Self-pay

## 2023-07-11 ENCOUNTER — Emergency Department (HOSPITAL_COMMUNITY)
Admission: EM | Admit: 2023-07-11 | Discharge: 2023-07-11 | Disposition: A | Discharge status: Home / Self Care | Source: Ambulatory Visit | Attending: Emergency Medicine | Admitting: Emergency Medicine

## 2023-07-11 ENCOUNTER — Encounter (HOSPITAL_COMMUNITY): Payer: Self-pay | Admitting: *Deleted

## 2023-07-11 ENCOUNTER — Telehealth: Payer: Self-pay | Admitting: Student in an Organized Health Care Education/Training Program

## 2023-07-11 DIAGNOSIS — R0789 Other chest pain: Secondary | ICD-10-CM | POA: Diagnosis not present

## 2023-07-11 DIAGNOSIS — N183 Chronic kidney disease, stage 3 unspecified: Secondary | ICD-10-CM | POA: Insufficient documentation

## 2023-07-11 DIAGNOSIS — Z87891 Personal history of nicotine dependence: Secondary | ICD-10-CM | POA: Insufficient documentation

## 2023-07-11 DIAGNOSIS — Z85828 Personal history of other malignant neoplasm of skin: Secondary | ICD-10-CM | POA: Diagnosis not present

## 2023-07-11 DIAGNOSIS — Z79899 Other long term (current) drug therapy: Secondary | ICD-10-CM | POA: Diagnosis not present

## 2023-07-11 DIAGNOSIS — Z7982 Long term (current) use of aspirin: Secondary | ICD-10-CM | POA: Diagnosis not present

## 2023-07-11 DIAGNOSIS — R072 Precordial pain: Secondary | ICD-10-CM | POA: Diagnosis present

## 2023-07-11 DIAGNOSIS — C9110 Chronic lymphocytic leukemia of B-cell type not having achieved remission: Secondary | ICD-10-CM | POA: Diagnosis not present

## 2023-07-11 DIAGNOSIS — E1122 Type 2 diabetes mellitus with diabetic chronic kidney disease: Secondary | ICD-10-CM | POA: Insufficient documentation

## 2023-07-11 DIAGNOSIS — I129 Hypertensive chronic kidney disease with stage 1 through stage 4 chronic kidney disease, or unspecified chronic kidney disease: Secondary | ICD-10-CM | POA: Diagnosis not present

## 2023-07-11 LAB — LIPASE, BLOOD: Lipase: 65 U/L — ABNORMAL HIGH (ref 11–51)

## 2023-07-11 LAB — CBC
HCT: 36.1 % — ABNORMAL LOW (ref 39.0–52.0)
Hemoglobin: 12.3 g/dL — ABNORMAL LOW (ref 13.0–17.0)
MCH: 32 pg (ref 26.0–34.0)
MCHC: 34.1 g/dL (ref 30.0–36.0)
MCV: 94 fL (ref 80.0–100.0)
Platelets: 189 K/uL (ref 150–400)
RBC: 3.84 MIL/uL — ABNORMAL LOW (ref 4.22–5.81)
RDW: 13.5 % (ref 11.5–15.5)
WBC: 26.4 K/uL — ABNORMAL HIGH (ref 4.0–10.5)
nRBC: 0.1 % (ref 0.0–0.2)

## 2023-07-11 LAB — COMPREHENSIVE METABOLIC PANEL WITH GFR
ALT: 17 U/L (ref 0–44)
AST: 25 U/L (ref 15–41)
Albumin: 4.1 g/dL (ref 3.5–5.0)
Alkaline Phosphatase: 48 U/L (ref 38–126)
Anion gap: 10 (ref 5–15)
BUN: 28 mg/dL — ABNORMAL HIGH (ref 8–23)
CO2: 21 mmol/L — ABNORMAL LOW (ref 22–32)
Calcium: 9.5 mg/dL (ref 8.9–10.3)
Chloride: 106 mmol/L (ref 98–111)
Creatinine, Ser: 1.7 mg/dL — ABNORMAL HIGH (ref 0.61–1.24)
GFR, Estimated: 40 mL/min — ABNORMAL LOW (ref >60)
Glucose, Bld: 172 mg/dL — ABNORMAL HIGH (ref 70–99)
Potassium: 4.1 mmol/L (ref 3.5–5.1)
Sodium: 137 mmol/L (ref 135–145)
Total Bilirubin: 0.5 mg/dL (ref 0.0–1.2)
Total Protein: 7.2 g/dL (ref 6.5–8.1)

## 2023-07-11 LAB — URINALYSIS, ROUTINE W REFLEX MICROSCOPIC
Bacteria, UA: NONE SEEN
Bilirubin Urine: NEGATIVE
Glucose, UA: >=500 mg/dL — AB
Hgb urine dipstick: NEGATIVE
Ketones, ur: NEGATIVE mg/dL
Leukocytes,Ua: NEGATIVE
Nitrite: NEGATIVE
Protein, ur: NEGATIVE mg/dL
Specific Gravity, Urine: 1.018 (ref 1.005–1.030)
pH: 5 (ref 5.0–8.0)

## 2023-07-11 LAB — TROPONIN I (HIGH SENSITIVITY)
Troponin I (High Sensitivity): 6 ng/L (ref <18)
Troponin I (High Sensitivity): 7 ng/L (ref <18)

## 2023-07-11 NOTE — Discharge Instructions (Addendum)
 Follow-up with your doctors regarding your chronic lymphocytic leukemia.  And also lipase was slightly elevated.  Your regular doctor can trend that.  Return for any new or worse symptoms.  Return for any chest pain lasting 15 minutes or longer.

## 2023-07-11 NOTE — ED Triage Notes (Signed)
 The pt has  epigastric pain this am 1000 that lasted  3 minutes  stents in heart from many years ago

## 2023-07-11 NOTE — Telephone Encounter (Signed)
 Received a page from Mr. Heldt and his wife to the on call cardiology pager. This morning Barry Zavala felt chest discomfort that resolved with a burp. The pain occurred several hours ago and has not reoccurred since. However, Barry Zavala and his wife are wondering given his CAD with prior stents if Barry Zavala should be evaluated in the ED tonight.  Barry Zavala describes the pain Barry Zavala felt as different that the pain that occurred with his prior MI. It is reasonable for him to come to the ED for ACS workup with an EKG and troponins. If his labs are normal, then Barry Zavala can follow up with his primary cardiologist in clinic.   Merlene Blood, MD MS Cardiology [facility]

## 2023-07-11 NOTE — ED Provider Notes (Signed)
 [place] [facility] AT [facility] Provider Note   CSN: [id number] Arrival date & time: 07/11/23  1826     Patient presents with: Abdominal Pain   Barry Zavala is a 83 y.o. male.   Patient with the acute onset of low substernal chest pain at 1015 that lasted 3 minutes went away when he belched.  Has not come back.  No other symptoms.  Vital signs here temp is 97.4 pulse is 69 respiration 17 blood pressure is 148/66.  Past medical history is significant for the chronic lymphocytic leukemia.  Hypertension hyperlipidemia type 2 diabetes aortic stenosis chronic kidney disease stage III squamous cell carcinoma of the skin and status post TAVR.  Patient is a former smoker quit 1988.       Prior to Admission medications   Medication Sig Start Date End Date Taking? Authorizing Provider  ACTOS  45 MG tablet Take 45 mg by mouth daily with supper.  12/06/16   [provider]  amoxicillin  (AMOXIL ) 500 MG capsule Take 4 capsules (2,000 mg total) one hour prior to all dental visits. 06/30/22   Verlin Lonni BIRCH, MD  aspirin  EC 81 MG tablet Take 1 tablet (81 mg total) by mouth daily. 02/01/16   Claudene Victory ORN, MD  Cholecalciferol (VITAMIN D3 SUPER STRENGTH) 50 MCG (2000 UT) TABS Take 2,000 Units by mouth daily.    [provider]  Dulaglutide (TRULICITY) 0.75 MG/0.5ML SOPN  05/22/20   [provider]  empagliflozin (JARDIANCE) 10 MG TABS tablet Take 10 mg by mouth daily.     [provider]  ezetimibe -simvastatin  (VYTORIN ) 10-20 MG tablet Take 1 tablet by mouth daily. Please keep scheduled appointment for future refills. Thank you. 05/04/23   Verlin Lonni BIRCH, MD  Glucosamine-Chondroitin (GLUCOSAMINE CHONDR COMPLEX PO) Take 1 capsule by mouth 2 (two) times daily. 500 mgs    [provider]  glyBURIDE -metformin  (GLUCOVANCE ) 5-500 MG per tablet Take 2 tablets by mouth 2 (two) times daily with a meal.    [provider]   metoprolol  succinate (TOPROL -XL) 50 MG 24 hr tablet Take 50 mg by mouth at bedtime. Take with or immediately following a meal.    [provider]  Multiple Vitamin (MULTIVITAMIN WITH MINERALS) TABS tablet Take 1 tablet by mouth daily. Centrum silver    [provider]  omega-3 acid ethyl esters (LOVAZA ) 1 G capsule Take 1 g by mouth in the morning, at noon, and at bedtime.     [provider]  omeprazole  (PRILOSEC) 20 MG capsule Take 1 capsule (20 mg total) by mouth 2 (two) times a week. 11/11/21   Onesimo Emaline Brink, MD  ramipril  (ALTACE ) 10 MG capsule Take 10 mg by mouth daily with supper.     [provider]    Allergies: Patient has no known allergies.    Review of Systems  Constitutional:  Negative for chills and fever.  HENT:  Negative for ear pain and sore throat.   Eyes:  Negative for pain and visual disturbance.  Respiratory:  Negative for cough and shortness of breath.   Cardiovascular:  Positive for chest pain. Negative for palpitations.  Gastrointestinal:  Negative for abdominal pain and vomiting.  Genitourinary:  Negative for dysuria and hematuria.  Musculoskeletal:  Negative for arthralgias and back pain.  Skin:  Negative for color change and rash.  Neurological:  Negative for seizures and syncope.  All other systems reviewed and are negative.   Updated Vital Signs BP ROLLEN)  126/52   Pulse 66   Temp (!) 97.4 F (36.3 C)   Resp 16   Ht 1.803 m (5' 11)   Wt 84.3 kg   SpO2 98%   BMI 25.92 kg/m   Physical Exam Vitals and nursing note reviewed.  Constitutional:      General: He is not in acute distress.    Appearance: Normal appearance. He is well-developed. He is not ill-appearing.  HENT:     Head: Normocephalic and atraumatic.     Mouth/Throat:     Mouth: Mucous membranes are moist.  Eyes:     Extraocular Movements: Extraocular movements intact.     Conjunctiva/sclera: Conjunctivae normal.     Pupils: Pupils are equal,  round, and reactive to light.  Cardiovascular:     Rate and Rhythm: Normal rate and regular rhythm.     Heart sounds: No murmur heard. Pulmonary:     Effort: Pulmonary effort is normal. No respiratory distress.     Breath sounds: Normal breath sounds.  Abdominal:     General: There is no distension.     Palpations: Abdomen is soft.     Tenderness: There is no abdominal tenderness. There is no guarding.  Musculoskeletal:        General: No swelling.     Cervical back: Normal range of motion and neck supple.     Right lower leg: No edema.     Left lower leg: No edema.  Skin:    General: Skin is warm and dry.     Capillary Refill: Capillary refill takes less than 2 seconds.  Neurological:     General: No focal deficit present.     Mental Status: He is alert and oriented to person, place, and time.  Psychiatric:        Mood and Affect: Mood normal.     (all labs ordered are listed, but only abnormal results are displayed) Labs Reviewed  LIPASE, BLOOD - Abnormal; Notable for the following components:      Result Value   Lipase 65 (*)    All other components within normal limits  COMPREHENSIVE METABOLIC PANEL WITH GFR - Abnormal; Notable for the following components:   CO2 21 (*)    Glucose, Bld 172 (*)    BUN 28 (*)    Creatinine, Ser 1.70 (*)    GFR, Estimated 40 (*)    All other components within normal limits  CBC - Abnormal; Notable for the following components:   WBC 26.4 (*)    RBC 3.84 (*)    Hemoglobin 12.3 (*)    HCT 36.1 (*)    All other components within normal limits  URINALYSIS, ROUTINE W REFLEX MICROSCOPIC - Abnormal; Notable for the following components:   APPearance HAZY (*)    Glucose, UA >=500 (*)    All other components within normal limits  TROPONIN I (HIGH SENSITIVITY)  TROPONIN I (HIGH SENSITIVITY)    EKG: EKG Interpretation Date/Time:  Saturday July 11 2023 18:53:49 EDT Ventricular Rate:  67 PR Interval:  164 QRS Duration:  128 QT  Interval:  390 QTC Calculation: 412 R Axis:   -39  Text Interpretation: Normal sinus rhythm Left axis deviation Right bundle branch block Minimal voltage criteria for LVH, may be normal variant ( R in aVL ) Cannot rule out Inferior infarct , age undetermined Abnormal ECG When compared with ECG of 16-Jan-2023 12:09, PREVIOUS ECG IS PRESENT No significant change since last tracing Confirmed by Danie Hannig (  [id number]) on 07/11/2023 9:13:17 PM  Radiology: No results found.   Procedures   Medications Ordered in the ED - No data to display                                  Medical Decision Making  Patient's labs significant for troponins first troponin was 6-second troponin was 7 so no evidence of any acute abnormalities there.  EKG right bundle branch block no significant change from old.  Complete metabolic panel liver function test normal GFR 40 but known to have some chronic kidney disease.  LFTs were normal.  White blood cell count 26,000 but known to have chronic lymphocytic leukemia.  Hemoglobin 12.3 platelets 189.  Urinalysis negative.  Patient never had chest x-ray done.  But I feel that based on his exam and only 3 minutes worth of symptoms he does not need a chest x-ray plus oxygen saturations are 98% on room air and patient has been completely asymptomatic since 1030 this morning  Final diagnoses:  Atypical chest pain  CLL (chronic lymphocytic leukemia) [facility])    ED Discharge Orders     None          Geraldene Hamilton, MD 07/11/23 2242

## 2023-08-02 ENCOUNTER — Other Ambulatory Visit: Payer: Self-pay | Admitting: Cardiovascular Disease

## 2023-09-09 DIAGNOSIS — Z23 Encounter for immunization: Secondary | ICD-10-CM | POA: Diagnosis not present

## 2023-09-16 DIAGNOSIS — C9110 Chronic lymphocytic leukemia of B-cell type not having achieved remission: Secondary | ICD-10-CM | POA: Diagnosis not present

## 2023-09-16 DIAGNOSIS — N183 Chronic kidney disease, stage 3 unspecified: Secondary | ICD-10-CM | POA: Diagnosis not present

## 2023-09-16 DIAGNOSIS — K219 Gastro-esophageal reflux disease without esophagitis: Secondary | ICD-10-CM | POA: Diagnosis not present

## 2023-09-16 DIAGNOSIS — E1169 Type 2 diabetes mellitus with other specified complication: Secondary | ICD-10-CM | POA: Diagnosis not present

## 2023-09-16 DIAGNOSIS — I10 Essential (primary) hypertension: Secondary | ICD-10-CM | POA: Diagnosis not present

## 2023-09-16 DIAGNOSIS — R972 Elevated prostate specific antigen [PSA]: Secondary | ICD-10-CM | POA: Diagnosis not present

## 2023-09-16 DIAGNOSIS — Z1331 Encounter for screening for depression: Secondary | ICD-10-CM | POA: Diagnosis not present

## 2023-09-16 DIAGNOSIS — E559 Vitamin D deficiency, unspecified: Secondary | ICD-10-CM | POA: Diagnosis not present

## 2023-09-16 DIAGNOSIS — Z0000 Encounter for general adult medical examination without abnormal findings: Secondary | ICD-10-CM | POA: Diagnosis not present

## 2023-09-16 DIAGNOSIS — I2510 Atherosclerotic heart disease of native coronary artery without angina pectoris: Secondary | ICD-10-CM | POA: Diagnosis not present

## 2023-09-16 DIAGNOSIS — E782 Mixed hyperlipidemia: Secondary | ICD-10-CM | POA: Diagnosis not present

## 2023-09-16 DIAGNOSIS — I6523 Occlusion and stenosis of bilateral carotid arteries: Secondary | ICD-10-CM | POA: Diagnosis not present

## 2023-09-16 DIAGNOSIS — R748 Abnormal levels of other serum enzymes: Secondary | ICD-10-CM | POA: Diagnosis not present

## 2023-09-24 DIAGNOSIS — N183 Chronic kidney disease, stage 3 unspecified: Secondary | ICD-10-CM | POA: Diagnosis not present

## 2023-10-02 ENCOUNTER — Encounter: Payer: Self-pay | Admitting: Internal Medicine

## 2023-10-02 DIAGNOSIS — Z23 Encounter for immunization: Secondary | ICD-10-CM | POA: Diagnosis not present

## 2023-10-05 DIAGNOSIS — Z961 Presence of intraocular lens: Secondary | ICD-10-CM | POA: Diagnosis not present

## 2023-10-05 DIAGNOSIS — H353122 Nonexudative age-related macular degeneration, left eye, intermediate dry stage: Secondary | ICD-10-CM | POA: Diagnosis not present

## 2023-10-07 DIAGNOSIS — Z23 Encounter for immunization: Secondary | ICD-10-CM | POA: Diagnosis not present

## 2023-10-12 DIAGNOSIS — H353113 Nonexudative age-related macular degeneration, right eye, advanced atrophic without subfoveal involvement: Secondary | ICD-10-CM | POA: Diagnosis not present

## 2023-10-12 DIAGNOSIS — E113292 Type 2 diabetes mellitus with mild nonproliferative diabetic retinopathy without macular edema, left eye: Secondary | ICD-10-CM | POA: Diagnosis not present

## 2023-10-12 DIAGNOSIS — H43822 Vitreomacular adhesion, left eye: Secondary | ICD-10-CM | POA: Diagnosis not present

## 2023-10-12 DIAGNOSIS — E113211 Type 2 diabetes mellitus with mild nonproliferative diabetic retinopathy with macular edema, right eye: Secondary | ICD-10-CM | POA: Diagnosis not present

## 2023-10-12 DIAGNOSIS — H43813 Vitreous degeneration, bilateral: Secondary | ICD-10-CM | POA: Diagnosis not present

## 2023-10-12 DIAGNOSIS — H353122 Nonexudative age-related macular degeneration, left eye, intermediate dry stage: Secondary | ICD-10-CM | POA: Diagnosis not present

## 2023-10-12 DIAGNOSIS — H35033 Hypertensive retinopathy, bilateral: Secondary | ICD-10-CM | POA: Diagnosis not present

## 2023-10-13 DIAGNOSIS — R351 Nocturia: Secondary | ICD-10-CM | POA: Diagnosis not present

## 2023-10-20 DIAGNOSIS — R972 Elevated prostate specific antigen [PSA]: Secondary | ICD-10-CM | POA: Diagnosis not present

## 2023-10-26 DIAGNOSIS — H353113 Nonexudative age-related macular degeneration, right eye, advanced atrophic without subfoveal involvement: Secondary | ICD-10-CM | POA: Diagnosis not present

## 2023-10-31 ENCOUNTER — Other Ambulatory Visit: Payer: Self-pay | Admitting: Cardiovascular Disease

## 2023-11-02 ENCOUNTER — Ambulatory Visit: Admitting: Hematology

## 2023-11-02 ENCOUNTER — Other Ambulatory Visit: Payer: Self-pay

## 2023-11-02 ENCOUNTER — Other Ambulatory Visit

## 2023-11-02 DIAGNOSIS — C9110 Chronic lymphocytic leukemia of B-cell type not having achieved remission: Secondary | ICD-10-CM

## 2023-11-03 ENCOUNTER — Inpatient Hospital Stay: Attending: Hematology

## 2023-11-03 ENCOUNTER — Inpatient Hospital Stay: Admitting: Hematology

## 2023-11-03 ENCOUNTER — Other Ambulatory Visit: Payer: Self-pay

## 2023-11-03 VITALS — BP 122/58 | HR 73 | Temp 97.3°F | Resp 20 | Wt 184.1 lb

## 2023-11-03 DIAGNOSIS — C9110 Chronic lymphocytic leukemia of B-cell type not having achieved remission: Secondary | ICD-10-CM

## 2023-11-03 DIAGNOSIS — Z7982 Long term (current) use of aspirin: Secondary | ICD-10-CM | POA: Diagnosis not present

## 2023-11-03 DIAGNOSIS — Z79899 Other long term (current) drug therapy: Secondary | ICD-10-CM | POA: Diagnosis not present

## 2023-11-03 DIAGNOSIS — Z87891 Personal history of nicotine dependence: Secondary | ICD-10-CM | POA: Diagnosis not present

## 2023-11-03 LAB — CBC WITH DIFFERENTIAL (CANCER CENTER ONLY)
Abs Immature Granulocytes: 0.06 K/uL (ref 0.00–0.07)
Basophils Absolute: 0.1 K/uL (ref 0.0–0.1)
Basophils Relative: 0 %
Eosinophils Absolute: 0.2 K/uL (ref 0.0–0.5)
Eosinophils Relative: 1 %
HCT: 28.5 % — ABNORMAL LOW (ref 39.0–52.0)
Hemoglobin: 9.7 g/dL — ABNORMAL LOW (ref 13.0–17.0)
Immature Granulocytes: 0 %
Lymphocytes Relative: 73 %
Lymphs Abs: 17.1 K/uL — ABNORMAL HIGH (ref 0.7–4.0)
MCH: 31.9 pg (ref 26.0–34.0)
MCHC: 34 g/dL (ref 30.0–36.0)
MCV: 93.8 fL (ref 80.0–100.0)
Monocytes Absolute: 0.7 K/uL (ref 0.1–1.0)
Monocytes Relative: 3 %
Neutro Abs: 5.4 K/uL (ref 1.7–7.7)
Neutrophils Relative %: 23 %
Platelet Count: 235 K/uL (ref 150–400)
RBC: 3.04 MIL/uL — ABNORMAL LOW (ref 4.22–5.81)
RDW: 14.8 % (ref 11.5–15.5)
Smear Review: NORMAL
WBC Count: 23.5 K/uL — ABNORMAL HIGH (ref 4.0–10.5)
nRBC: 0 % (ref 0.0–0.2)

## 2023-11-03 LAB — CMP (CANCER CENTER ONLY)
ALT: 16 U/L (ref 0–44)
AST: 25 U/L (ref 15–41)
Albumin: 4.3 g/dL (ref 3.5–5.0)
Alkaline Phosphatase: 48 U/L (ref 38–126)
Anion gap: 5 (ref 5–15)
BUN: 27 mg/dL — ABNORMAL HIGH (ref 8–23)
CO2: 23 mmol/L (ref 22–32)
Calcium: 9.6 mg/dL (ref 8.9–10.3)
Chloride: 110 mmol/L (ref 98–111)
Creatinine: 1.82 mg/dL — ABNORMAL HIGH (ref 0.61–1.24)
GFR, Estimated: 36 mL/min — ABNORMAL LOW (ref >60)
Glucose, Bld: 123 mg/dL — ABNORMAL HIGH (ref 70–99)
Potassium: 4.6 mmol/L (ref 3.5–5.1)
Sodium: 138 mmol/L (ref 135–145)
Total Bilirubin: 0.3 mg/dL (ref 0.0–1.2)
Total Protein: 7.3 g/dL (ref 6.5–8.1)

## 2023-11-03 LAB — IRON AND IRON BINDING CAPACITY (CC-WL,HP ONLY)
Iron: 76 ug/dL (ref 45–182)
Saturation Ratios: 19 % (ref 17.9–39.5)
TIBC: 412 ug/dL (ref 250–450)
UIBC: 336 ug/dL (ref 117–376)

## 2023-11-03 LAB — RETIC PANEL
Immature Retic Fract: 22.7 % — ABNORMAL HIGH (ref 2.3–15.9)
RBC.: 3.07 MIL/uL — ABNORMAL LOW (ref 4.22–5.81)
Retic Count, Absolute: 90.9 K/uL (ref 19.0–186.0)
Retic Ct Pct: 3 % (ref 0.4–3.1)
Reticulocyte Hemoglobin: 34.7 pg (ref >27.9)

## 2023-11-03 LAB — FERRITIN: Ferritin: 50 ng/mL (ref 24–336)

## 2023-11-03 LAB — LACTATE DEHYDROGENASE: LDH: 234 U/L — ABNORMAL HIGH (ref 98–192)

## 2023-11-03 NOTE — Progress Notes (Signed)
 HEMATOLOGY/ONCOLOGY CLINIC NOTE  Date of Service: 11/03/2023  Patient Care Team: Charlott Dorn LABOR, MD as PCP - General (Internal Medicine) Verlin Lonni BIRCH, MD as PCP - Cardiology (Cardiology) Livingston Rigg, MD as Consulting Physician (Dermatology)  CHIEF COMPLAINTS/PURPOSE OF CONSULTATION:   follow-up for continued evaluation and management of chronic lymphocytic leukemia.  HISTORY OF PRESENTING ILLNESS: 02/2009  Barry Zavala is a wonderful 83 y.o. male who has been referred to us  by Dr Lamar Bolder for evaluation and management of leukocytosis (possible lymphoma). We are joined by his wife, Barry Zavala, via phone. The pt reports that he is doing well overall.  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 He went to see his Dermatologist, in the summer, who prescribed 5% Betamethasone  Dipropionate. He has been having to use this cream over a large portion of his body. His rash has improved but has not gone away.    He received his first dose of the COVID19 vaccine on 01/14/2019. His WBC was around 17K at this time. He saw his Cardiologist, Dr. Claudene, on 02/02/2019 and found that his WBC had decreased to 13K. He received the second dose of the COVID19 vaccine on 02/04/2019.   He has not felt any different in the last 3-6 months. His other chronic conditions are currently being well managed. Pt golfs 4-5 days per week if the weather allows. He has been on Jardiance for some time and has not needed to be on steroids for any reason within the last 6 months. Pt was a previous smoker and quit in 1988. He does not drink any alcohol. Pt is a retired engineer, structural. Pt has had some previous dental issues that were corrected by a Periodontist. He denies any current issues with his gums. Pt has never needed a blood transfusion and has not had any tattoos. He has benign prostatitis and there have been recent elevations in PSA levels. His sister passed from Multiple Myeloma  but he is not aware of any other blood disorder or cancers.   Most recent lab results (02/02/2019) of CBC and BMP is as follows: all values are WNL except for WBC at 13.9K, RBC at 4.05, Hgb at 12.5, HCT at 38.1  On review of systems, pt denies fevers, chills, night sweats, unexpected weight loss, abdominal pain, chest pain, SOB, new lumps/bumps, sore throat, rhinorrhea, dysuria, hematuria, difficulty starting urine stream, forcing urine stream and any other symptoms.   On PMHx the pt reports Benign Prostatitis, CAD, Elevated PSA, GERD, HLD, HTN, Type 2 Diabetes. On Social Hx the pt reports that he is a previous smoker (quit in 1988), does not drink any alcohol. He is a retired clinical research associate. On Family Hx the pt reports that he had a sister who passed from Multiple Myeloma  INTERVAL HISTORY:   Barry Zavala is a 83 y.o. male here for continued evaluation and management of his CLL. He is accompanied by his wife.   Patient was last seen by me on 05/19/2023 and he noted some mild grade 1 fatigue but was otherwise doing well.   Today, he notes some fatigue recently, which he attributes to more walking on the golf course due to redesigns, but is overall doing well.   Says that he has been on ASA since his MI 30 years ago - initially was on 325 mg for 15 years, and for the past 15 years has been on 81 mg.  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 Verlin and Dr. Darrol    Severe aortic stenosis    Squamous cell carcinoma of skin 05/13/2016   bridge of nose ([id number])   Type 2 diabetes mellitus (HCC)     SURGICAL HISTORY: Past Surgical History:  Procedure Laterality Date   APPENDECTOMY     CARDIAC CATHETERIZATION  2021   cardiac stents     CARPAL TUNNEL RELEASE Left 12/09/2016   Procedure: CARPAL TUNNEL RELEASE;  Surgeon: Murrell Kuba, MD;  Location: [facility];  Service: Orthopedics;  Laterality: Left;   CARPAL TUNNEL WITH CUBITAL TUNNEL Right 01/13/2017   Procedure: RIGHT CARPAL TUNNEL WITH CUBITAL TUNNEL RELEASE;  Surgeon: Murrell Kuba, MD;  Location: [facility];  Service: Orthopedics;  Laterality: Right;   CORONARY PRESSURE/FFR STUDY N/A 11/15/2019   Procedure: INTRAVASCULAR PRESSURE WIRE/FFR STUDY;  Surgeon: Verlin Lonni BIRCH, MD;  Location: [facility];  Service: Cardiovascular;  Laterality: N/A;   EYE SURGERY Bilateral 2019   Dr. Roz   RIGHT/LEFT HEART CATH AND CORONARY ANGIOGRAPHY N/A 02/02/2019   Procedure: RIGHT/LEFT HEART CATH AND CORONARY ANGIOGRAPHY;  Surgeon: Claudene Victory ORN, MD;  Location: [facility];  Service: Cardiovascular;  Laterality: N/A;   RIGHT/LEFT HEART CATH AND CORONARY ANGIOGRAPHY N/A 11/15/2019   Procedure: RIGHT/LEFT HEART CATH AND CORONARY ANGIOGRAPHY;  Surgeon: Verlin Lonni BIRCH, MD;  Location: [facility];  Service: Cardiovascular;  Laterality: N/A;   TEE WITHOUT CARDIOVERSION N/A 12/13/2019   Procedure: TRANSESOPHAGEAL ECHOCARDIOGRAM (TEE);  Surgeon: Verlin Lonni BIRCH, MD;  Location: [facility];  Service: Open Heart Surgery;  Laterality: N/A;   TRANSCATHETER AORTIC VALVE REPLACEMENT, TRANSFEMORAL  12/13/2019   TRANSCATHETER AORTIC VALVE REPLACEMENT, TRANSFEMORAL N/A 12/13/2019   Procedure: TRANSCATHETER AORTIC VALVE REPLACEMENT, TRANSFEMORAL;  Surgeon:  Verlin Lonni BIRCH, MD;  Location: [facility];  Service: Open Heart Surgery;  Laterality: N/A;   TRIGGER FINGER RELEASE Left 08/10/2018   Procedure: RELEASE TRIGGER FINGER/A-1 PULLEY THUMB AND LEFT SMALL;  Surgeon: Murrell Kuba, MD;  Location: [facility];  Service: Orthopedics;  Laterality: Left;  FAB   ULNAR NERVE TRANSPOSITION Left 12/09/2016   Procedure: DECOMPRESSION LEFT ULNAR NERVE;  Surgeon: Murrell Kuba, MD;  Location: [facility];  Service: Orthopedics;  Laterality: Left;   ULNAR NERVE TRANSPOSITION Right 01/13/2017   Procedure: DECOMPRESSION ULNAR NERVE;  Surgeon: Murrell Kuba, MD;  Location: [facility];  Service: Orthopedics;  Laterality: Right;    SOCIAL HISTORY: Social History   Socioeconomic History   Marital status: Married    Spouse name: Not on file   Number of children: 2   Years of education: Not on file   Highest education level: Not on file  Occupational History   Occupation: Retired clinical research associate  Tobacco  Use   Smoking status: Former    Current packs/day: 0.00    Average packs/day: 1 pack/day for 20.0 years (20.0 ttl pk-yrs)    Types: Cigarettes    Start date: 22    Quit date: 1988    Years since quitting: 37.8   Smokeless tobacco: Never  Vaping Use   Vaping status: Never Used  Substance and Sexual Activity   Alcohol use: Yes    Alcohol/week: 0.0 standard drinks of alcohol    Comment: social   Drug use: No   Sexual activity: Yes  Other Topics Concern   Not on file  Social History Narrative   Not on file   Social Drivers of Health   Financial Resource Strain: Not on file  Food Insecurity: Not on file  Transportation Needs: Not on file  Physical Activity: Not on file  Stress: Not on file  Social Connections: Not on file  Intimate Partner Violence: Not on file    FAMILY HISTORY: Family History  Problem Relation Age of Onset   Ovarian cancer Mother 84   Heart failure Father    Cancer Paternal Uncle         Karposi Sarcoma   Multiple myeloma Sister 56    ALLERGIES:  has no known allergies.  MEDICATIONS:  Current Outpatient Medications  Medication Sig Dispense Refill   ACTOS  45 MG tablet Take 45 mg by mouth daily with supper.   1   amoxicillin  (AMOXIL ) 500 MG capsule Take 4 capsules (2,000 mg total) one hour prior to all dental visits. 24 capsule 3   aspirin  EC 81 MG tablet Take 1 tablet (81 mg total) by mouth daily. 90 tablet 3   Cholecalciferol (VITAMIN D3 SUPER STRENGTH) 50 MCG (2000 UT) TABS Take 2,000 Units by mouth daily.     Dulaglutide (TRULICITY) 0.75 MG/0.5ML SOPN      empagliflozin (JARDIANCE) 10 MG TABS tablet Take 10 mg by mouth daily.      ezetimibe -simvastatin  (VYTORIN ) 10-20 MG tablet TAKE ONE TABLET BY MOUTH DAILY 90 tablet 0   Glucosamine-Chondroitin (GLUCOSAMINE CHONDR COMPLEX PO) Take 1 capsule by mouth 2 (two) times daily. 500 mgs     glyBURIDE -metformin  (GLUCOVANCE ) 5-500 MG per tablet Take 2 tablets by mouth 2 (two) times daily with a meal.     metoprolol  succinate (TOPROL -XL) 50 MG 24 hr tablet Take 50 mg by mouth at bedtime. Take with or immediately following a meal.     Multiple Vitamin (MULTIVITAMIN WITH MINERALS) TABS tablet Take 1 tablet by mouth daily. Centrum silver     omega-3 acid ethyl esters (LOVAZA ) 1 G capsule Take 1 g by mouth in the morning, at noon, and at bedtime.      omeprazole  (PRILOSEC) 20 MG capsule Take 1 capsule (20 mg total) by mouth 2 (two) times a week. 30 capsule    ramipril  (ALTACE ) 10 MG capsule Take 10 mg by mouth daily with supper.      No current facility-administered medications for this visit.    REVIEW OF SYSTEMS:    10 Point review of Systems was done is negative except as noted above.   PHYSICAL EXAMINATION: ECOG PERFORMANCE STATUS: 1 - Symptomatic but completely ambulatory .SABRABP (!) 122/58   Pulse 73   Temp (!) 97.3 F (36.3 C)   Resp 20   Wt 184 lb 1.6 oz (83.5 kg)   SpO2 100%   BMI 25.68 kg/m  GENERAL:alert, in no  acute distress and comfortable SKIN: no acute  rashes, no significant lesions EYES: conjunctiva are pink and non-injected, sclera anicteric OROPHARYNX: MMM, no exudates, no oropharyngeal erythema or ulceration NECK: supple, no JVD LYMPH:  no palpable lymphadenopathy in the cervical, axillary or inguinal regions LUNGS: clear to auscultation b/l with normal respiratory effort HEART: regular rate & rhythm, (+) Mild Heart Murmur ABDOMEN:  normoactive bowel sounds , non tender, not distended.  No palpable hepatosplenomegaly Extremity: no pedal edema PSYCH: alert & oriented x 3 with fluent speech NEURO: no focal motor/sensory deficits   LABORATORY DATA:  I have reviewed the data as listed     Latest Ref Rng & Units 11/03/2023    1:18 PM 07/11/2023    7:10 PM 05/19/2023   12:27 PM  CBC EXTENDED  WBC 4.0 - 10.5 K/uL 23.5  26.4  21.2   RBC 4.22 - 5.81 MIL/uL 4.22 - 5.81 MIL/uL 3.04    3.07  3.84  3.74   Hemoglobin 13.0 - 17.0 g/dL 9.7  87.6  88.5   HCT 60.9 - 52.0 % 28.5  36.1  33.5   Platelets 150 - 400 K/uL 235  189  194   NEUT# 1.7 - 7.7 K/uL 5.4   4.3   Lymph# 0.7 - 4.0 K/uL 17.1   16.0    Iron Panel Lab Results  Component Value Date   IRON 76 11/03/2023   UIBC 336 11/03/2023   TIBC 412 11/03/2023   IRONPCTSAT 19 11/03/2023   FERRITIN 50 11/03/2023    LDH Lab Results  Component Value Date   LDH 234 (H) 11/03/2023    Reticulocyte Panel 11/03/2023  Retic Ct Pct     0.4 - 3.1 % 3.0   RBC.     4.22 - 5.81 MIL/uL 3.07 (L)   Retic Count, Absolute     19.0 - 186.0 K/uL 90.9   Immature Retic Fract     2.3 - 15.9 % 22.7 (H)   Reticulocyte Hemoglobin     >27.9 pg 34.7         Latest Ref Rng & Units 11/03/2023    1:18 PM 07/11/2023    7:10 PM 05/19/2023   12:27 PM  CMP  Glucose 70 - 99 mg/dL 876  827  874   BUN 8 - 23 mg/dL 27  28  25    Creatinine 0.61 - 1.24 mg/dL 8.17  8.29  8.47   Sodium 135 - 145 mmol/L 138  137  140   Potassium 3.5 - 5.1 mmol/L 4.6  4.1  4.3    Chloride 98 - 111 mmol/L 110  106  110   CO2 22 - 32 mmol/L 23  21  26    Calcium 8.9 - 10.3 mg/dL 9.6  9.5  9.1   Total Protein 6.5 - 8.1 g/dL 7.3  7.2  7.0   Total Bilirubin 0.0 - 1.2 mg/dL 0.3  0.5  0.4   Alkaline Phos 38 - 126 U/L 48  48  48   AST 15 - 41 U/L 25  25  21    ALT 0 - 44 U/L 16  17  15         02/08/2019 Flow Pathology:     WBC  Lymphs Abs   12/05/2015 10.1K 3.5K  12/10/2017 12.0K 7.0K  12/15/2018 14.9K 8.0K   RADIOGRAPHIC STUDIES: I have personally reviewed the radiological images as listed and agreed with the findings in the report. No results found.   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  Some anemia today, recommended Iron Polysaccharide due to common side effect of constipation with Ferrous Sulfate.  CMP with Creatinine 1.82 increased from 1.70, BUN 27 decreased from 28.  Stable CKD.  LDH stable at 234 Ferritin 50 with an iron saturation of 19% In the context of anemia from CKD would recommend po iron polysaccharide to target ferritin of close to 200-250 and iron saturation of 30%  If PO iron does not achieve this by next visit will offer option for IV iron replacement. No overt evidence of CLL progression at this time causing symptoms or having indications to initiate treatment. No overt evidence of bleeding. Will f/u in 4 months instead of 6 months to allow for closer monitoring of his anemia.  FOLLOW UP: Return to clinic with Dr. Onesimo with labs in 4 months  The total time spent in the appointment was 21 minutes* .  All of the patient's questions were answered with apparent satisfaction. The patient knows to call the clinic with any problems, questions  or concerns.   Barry Onesimo MD MS AAHIVMS [facility] Hematology/Oncology Physician [facility]  .*Total Encounter Time as defined by the Centers for Medicare and Medicaid Services includes, in addition to the face-to-face time of a patient visit (documented in the note above) non-face-to-face time: obtaining and reviewing outside history, ordering and reviewing medications, tests or procedures, care coordination (communications with other health care professionals or caregivers) and documentation in the medical record.    I,  Barry Zavala,acting as a scribe for Barry Onesimo, MD.,have documented all relevant documentation on the behalf of Barry Onesimo, MD,as directed by  Barry Onesimo, MD while in the presence of Barry Onesimo, MD.  I have reviewed the above documentation for accuracy and completeness, and I agree with the above. Barry Candida Onesimo MD.

## 2023-11-05 DIAGNOSIS — E785 Hyperlipidemia, unspecified: Secondary | ICD-10-CM | POA: Diagnosis not present

## 2023-11-05 DIAGNOSIS — D649 Anemia, unspecified: Secondary | ICD-10-CM | POA: Diagnosis not present

## 2023-11-05 DIAGNOSIS — Z952 Presence of prosthetic heart valve: Secondary | ICD-10-CM | POA: Diagnosis not present

## 2023-11-05 DIAGNOSIS — R918 Other nonspecific abnormal finding of lung field: Secondary | ICD-10-CM | POA: Diagnosis not present

## 2023-11-05 DIAGNOSIS — I700 Atherosclerosis of aorta: Secondary | ICD-10-CM | POA: Diagnosis not present

## 2023-11-05 DIAGNOSIS — I129 Hypertensive chronic kidney disease with stage 1 through stage 4 chronic kidney disease, or unspecified chronic kidney disease: Secondary | ICD-10-CM | POA: Diagnosis not present

## 2023-11-05 DIAGNOSIS — E1122 Type 2 diabetes mellitus with diabetic chronic kidney disease: Secondary | ICD-10-CM | POA: Diagnosis not present

## 2023-11-05 DIAGNOSIS — I2510 Atherosclerotic heart disease of native coronary artery without angina pectoris: Secondary | ICD-10-CM | POA: Diagnosis not present

## 2023-11-05 DIAGNOSIS — C9110 Chronic lymphocytic leukemia of B-cell type not having achieved remission: Secondary | ICD-10-CM | POA: Diagnosis not present

## 2023-11-05 DIAGNOSIS — N1832 Chronic kidney disease, stage 3b: Secondary | ICD-10-CM | POA: Diagnosis not present

## 2023-11-05 DIAGNOSIS — I6523 Occlusion and stenosis of bilateral carotid arteries: Secondary | ICD-10-CM | POA: Diagnosis not present

## 2023-12-10 DIAGNOSIS — E113211 Type 2 diabetes mellitus with mild nonproliferative diabetic retinopathy with macular edema, right eye: Secondary | ICD-10-CM | POA: Diagnosis not present

## 2023-12-10 DIAGNOSIS — E113292 Type 2 diabetes mellitus with mild nonproliferative diabetic retinopathy without macular edema, left eye: Secondary | ICD-10-CM | POA: Diagnosis not present

## 2023-12-10 DIAGNOSIS — H35033 Hypertensive retinopathy, bilateral: Secondary | ICD-10-CM | POA: Diagnosis not present

## 2023-12-10 DIAGNOSIS — H353122 Nonexudative age-related macular degeneration, left eye, intermediate dry stage: Secondary | ICD-10-CM | POA: Diagnosis not present

## 2023-12-10 DIAGNOSIS — H353113 Nonexudative age-related macular degeneration, right eye, advanced atrophic without subfoveal involvement: Secondary | ICD-10-CM | POA: Diagnosis not present

## 2023-12-10 DIAGNOSIS — H43823 Vitreomacular adhesion, bilateral: Secondary | ICD-10-CM | POA: Diagnosis not present

## 2023-12-10 DIAGNOSIS — H43813 Vitreous degeneration, bilateral: Secondary | ICD-10-CM | POA: Diagnosis not present

## 2023-12-22 ENCOUNTER — Encounter: Payer: Self-pay | Admitting: Cardiovascular Disease

## 2023-12-22 ENCOUNTER — Ambulatory Visit: Attending: Cardiovascular Disease | Admitting: Cardiovascular Disease

## 2023-12-22 VITALS — BP 118/58 | HR 73 | Ht 71.0 in | Wt 183.0 lb

## 2023-12-22 DIAGNOSIS — I10 Essential (primary) hypertension: Secondary | ICD-10-CM | POA: Diagnosis not present

## 2023-12-22 DIAGNOSIS — E782 Mixed hyperlipidemia: Secondary | ICD-10-CM | POA: Diagnosis present

## 2023-12-22 DIAGNOSIS — Z952 Presence of prosthetic heart valve: Secondary | ICD-10-CM

## 2023-12-22 DIAGNOSIS — I2510 Atherosclerotic heart disease of native coronary artery without angina pectoris: Secondary | ICD-10-CM | POA: Insufficient documentation

## 2023-12-22 DIAGNOSIS — I6523 Occlusion and stenosis of bilateral carotid arteries: Secondary | ICD-10-CM | POA: Diagnosis present

## 2023-12-22 NOTE — Progress Notes (Signed)
 Chief Complaint  Patient presents with   Follow-up    CAD, aortic valve disease   History of Present Illness: 83 yo male with history of severe aortic stenosis s/p TAVR in 2021, CAD, HLD, HTN, DM, CKD stage3 and RBBB here today for cardiac follow up. He has been followed by Dr. Claudene.  He is known to have CAD with MI in 1995 and PCI in 2001. Cardiac cath November 2021 with patent mid LAD stent, patent mid Circumflex stent into the OM, moderate eccentric mid to distal RCA (RFR 0.97). He had severe aortic stenosis and underwent TAVR in December 2021 with placement of a  23 mm Edwards Sapien 3 Ultra THV via the transfemoral approach. Echo November 2022 with normal LV function and normally functioning AVR. Carotid artery dopplers December 2023 with mild bilateral disease.   He is here today for follow up. The patient denies any chest pain, dyspnea, palpitations, lower extremity edema, orthopnea, PND, dizziness, near syncope or syncope.   Primary Care Physician: Charlott Dorn LABOR, MD   Past Medical History:  Diagnosis Date   Anemia    Arthritis    hands   Chronic kidney disease (CKD), stage III (moderate) (HCC)    Coronary artery disease    Elevated PSA    Erectile dysfunction    GERD (gastroesophageal reflux disease)    Hyperlipidemia    Hypertension    Irritable bowel syndrome    Leukocytosis    2/17//21 Mable Saran, MD), labs consistent with newly diagnosed chronic lymphocytic leukemia   Myocardial infarction [facility]) 1995   Obesity    Onychomycosis    Palmar fibromatosis    Right bundle branch block    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 Verlin and Dr. Darrol    Severe aortic stenosis    Squamous cell carcinoma of skin 05/13/2016   bridge of nose ([id number])   Type 2 diabetes mellitus (HCC)     Past Surgical History:  Procedure Laterality Date   APPENDECTOMY     CARDIAC  CATHETERIZATION  2021   cardiac stents     CARPAL TUNNEL RELEASE Left 12/09/2016   Procedure: CARPAL TUNNEL RELEASE;  Surgeon: Murrell Kuba, MD;  Location: [facility];  Service: Orthopedics;  Laterality: Left;   CARPAL TUNNEL WITH CUBITAL TUNNEL Right 01/13/2017   Procedure: RIGHT CARPAL TUNNEL WITH CUBITAL TUNNEL RELEASE;  Surgeon: Murrell Kuba, MD;  Location: [facility];  Service: Orthopedics;  Laterality: Right;   CORONARY PRESSURE/FFR STUDY N/A 11/15/2019   Procedure: INTRAVASCULAR PRESSURE WIRE/FFR STUDY;  Surgeon: Verlin Lonni BIRCH, MD;  Location: [facility];  Service: Cardiovascular;  Laterality: N/A;   EYE SURGERY Bilateral 2019   Dr. Roz   RIGHT/LEFT HEART CATH AND CORONARY ANGIOGRAPHY N/A 02/02/2019   Procedure: RIGHT/LEFT HEART CATH AND CORONARY ANGIOGRAPHY;  Surgeon: Claudene Victory ORN, MD;  Location: [facility];  Service: Cardiovascular;  Laterality: N/A;   RIGHT/LEFT HEART CATH AND CORONARY ANGIOGRAPHY N/A 11/15/2019   Procedure: RIGHT/LEFT HEART CATH AND CORONARY ANGIOGRAPHY;  Surgeon: Verlin Lonni BIRCH, MD;  Location: [facility];  Service: Cardiovascular;  Laterality: N/A;   TEE WITHOUT CARDIOVERSION N/A 12/13/2019   Procedure: TRANSESOPHAGEAL ECHOCARDIOGRAM (TEE);  Surgeon: Verlin Lonni BIRCH, MD;  Location: [facility];  Service: Open Heart Surgery;  Laterality: N/A;   TRANSCATHETER AORTIC VALVE REPLACEMENT, TRANSFEMORAL  12/13/2019  TRANSCATHETER AORTIC VALVE REPLACEMENT, TRANSFEMORAL N/A 12/13/2019   Procedure: TRANSCATHETER AORTIC VALVE REPLACEMENT, TRANSFEMORAL;  Surgeon: Verlin Lonni BIRCH, MD;  Location: [facility];  Service: Open Heart Surgery;  Laterality: N/A;   TRIGGER FINGER RELEASE Left 08/10/2018   Procedure: RELEASE TRIGGER FINGER/A-1 PULLEY THUMB AND LEFT SMALL;  Surgeon: Murrell Kuba, MD;  Location: [facility];  Service: Orthopedics;  Laterality: Left;  FAB   ULNAR NERVE TRANSPOSITION Left  12/09/2016   Procedure: DECOMPRESSION LEFT ULNAR NERVE;  Surgeon: Murrell Kuba, MD;  Location: [facility];  Service: Orthopedics;  Laterality: Left;   ULNAR NERVE TRANSPOSITION Right 01/13/2017   Procedure: DECOMPRESSION ULNAR NERVE;  Surgeon: Murrell Kuba, MD;  Location: [facility];  Service: Orthopedics;  Laterality: Right;    Current Outpatient Medications  Medication Sig Dispense Refill   ACTOS  45 MG tablet Take 45 mg by mouth daily with supper.   1   amoxicillin  (AMOXIL ) 500 MG capsule Take 4 capsules (2,000 mg total) one hour prior to all dental visits. 24 capsule 3   aspirin  EC 81 MG tablet Take 1 tablet (81 mg total) by mouth daily. 90 tablet 3   B Complex Vitamins (VITAMIN B COMPLEX) TABS Take 1 tablet by mouth daily at 6 (six) AM.     Cholecalciferol (VITAMIN D3 SUPER STRENGTH) 50 MCG (2000 UT) TABS Take 2,000 Units by mouth daily.     Dulaglutide (TRULICITY) 0.75 MG/0.5ML SOPN      empagliflozin (JARDIANCE) 10 MG TABS tablet Take 10 mg by mouth daily.      ezetimibe -simvastatin  (VYTORIN ) 10-20 MG tablet TAKE ONE TABLET BY MOUTH DAILY 90 tablet 0   Glucosamine-Chondroitin (GLUCOSAMINE CHONDR COMPLEX PO) Take 1 capsule by mouth 2 (two) times daily. 500 mgs     glyBURIDE -metformin  (GLUCOVANCE ) 5-500 MG per tablet Take 2 tablets by mouth 2 (two) times daily with a meal.     Lutein 20 MG CAPS Take 1 capsule by mouth daily at 6 (six) AM.     metoprolol  succinate (TOPROL -XL) 50 MG 24 hr tablet Take 50 mg by mouth at bedtime. Take with or immediately following a meal.     Multiple Vitamin (MULTIVITAMIN WITH MINERALS) TABS tablet Take 1 tablet by mouth daily. Centrum silver     omega-3 acid ethyl esters (LOVAZA ) 1 G capsule Take 1 g by mouth in the morning, at noon, and at bedtime.      omeprazole  (PRILOSEC) 20 MG capsule Take 1 capsule (20 mg total) by mouth 2 (two) times a week. 30 capsule    ramipril  (ALTACE ) 10 MG capsule Take 10 mg by mouth daily with supper.       No current facility-administered medications for this visit.    Allergies  Allergen Reactions   Tea Tree Oil     Other Reaction(s): ankle edema    Social History   Socioeconomic History   Marital status: Married    Spouse name: Not on file   Number of children: 2   Years of education: Not on file   Highest education level: Not on file  Occupational History   Occupation: Retired clinical research associate  Tobacco Use   Smoking status: Former    Current packs/day: 0.00    Average packs/day: 1 pack/day for 20.0 years (20.0 ttl pk-yrs)    Types: Cigarettes    Start date: 13    Quit date: 1988    Years since quitting: 37.9   Smokeless tobacco: Never  Vaping Use  Vaping status: Never Used  Substance and Sexual Activity   Alcohol use: Yes    Alcohol/week: 0.0 standard drinks of alcohol    Comment: social   Drug use: No   Sexual activity: Yes  Other Topics Concern   Not on file  Social History Narrative   Not on file   Social Drivers of Health   Tobacco Use: Medium Risk (12/22/2023)   Patient History    Smoking Tobacco Use: Former    Smokeless Tobacco Use: Never    Passive Exposure: Not on Actuary Strain: Not on file  Food Insecurity: Not on file  Transportation Needs: Not on file  Physical Activity: Not on file  Stress: Not on file  Social Connections: Not on file  Intimate Partner Violence: Not on file  Depression (EYV7-0): Not on file  Alcohol Screen: Not on file  Housing: Not on file  Utilities: Not on file  Health Literacy: Not on file    Family History  Problem Relation Age of Onset   Ovarian cancer Mother 90   Heart failure Father    Cancer Paternal Uncle        Karposi Sarcoma   Multiple myeloma Sister 56    Review of Systems:  As stated in the HPI and otherwise negative.   BP (!) 118/58   Pulse 73   Ht 5' 11 (1.803 m)   Wt 183 lb (83 kg)   SpO2 97%   BMI 25.52 kg/m   Physical Examination: General: Well developed, well nourished,  NAD  HEENT: OP clear, mucus membranes moist  SKIN: warm, dry. No rashes. Neuro: No focal deficits  Musculoskeletal: Muscle strength 5/5 all ext  Psychiatric: Mood and affect normal  Neck: No JVD, no carotid bruits, no thyromegaly, no lymphadenopathy.  Lungs:Clear bilaterally, no wheezes, rhonci, crackles Cardiovascular: Regular rate and rhythm. No murmurs, gallops or rubs. Abdomen:Soft. Bowel sounds present. Non-tender.  Extremities: No lower extremity edema. Pulses are 2 + in the bilateral DP/PT.  EKG:  EKG is not ordered today.  The ekg ordered today demonstrates   Recent Labs: 11/03/2023: ALT 16; BUN 27; Creatinine 1.82; Hemoglobin 9.7; Platelet Count 235; Potassium 4.6; Sodium 138   Lipid Panel    Component Value Date/Time   CHOL 83 (L) 01/24/2015 0949   TRIG 49 01/24/2015 0949   HDL 38 (L) 01/24/2015 0949   CHOLHDL 2.2 01/24/2015 0949   VLDL 10 01/24/2015 0949   LDLCALC 35 01/24/2015 0949     Wt Readings from Last 3 Encounters:  12/22/23 183 lb (83 kg)  11/03/23 184 lb 1.6 oz (83.5 kg)  07/11/23 185 lb 13.6 oz (84.3 kg)    Assessment and Plan:   1. CAD without angina: No chest pain.  -Continue ASA, Vytorin  and Toprol .     2. Aortic valve disease: He is s/p TAVR for severe aortic stenosis in December 2021. -Continue ASA -Continue SBE prophylaxis as indicated.    3. HTN: BP is well controlled.  -Continue Toprol  and Altace   4. Hyperlipidemia: Lipids followed in primary care. LDL 53 in September 2025.  -Continue Vytorin    5. Carotid artery disease: Mild disease by dopplers December 2023.  -Repeat carotid artery dopplers in 2026  Labs/ tests ordered today include:  No orders of the defined types were placed in this encounter.  Disposition:   F/U with me in 6 months at pt request  Signed, Lonni Cash, MD, [facility] 12/22/2023 4:29 PM    [facility] [address] [place], [place], [place]  [address] Phone: [phone]; Fax: [phone]

## 2023-12-22 NOTE — Patient Instructions (Signed)
 Medication Instructions:  Your physician recommends that you continue on your current medications as directed. Please refer to the Current Medication list given to you today.  *If you need a refill on your cardiac medications before your next appointment, please call your pharmacy*  Lab Work: none If you have labs (blood work) drawn today and your tests are completely normal, you will receive your results only by: MyChart Message (if you have MyChart) OR A paper copy in the mail If you have any lab test that is abnormal or we need to change your treatment, we will call you to review the results.  Testing/Procedures: none  Follow-Up: At [facility], you and your health needs are our priority.  As part of our continuing mission to provide you with exceptional heart care, our providers are all part of one team.  This team includes your primary Cardiologist (physician) and Advanced Practice Providers or APPs (Physician Assistants and Nurse Practitioners) who all work together to provide you with the care you need, when you need it.  Your next appointment:   6 month(s)  Provider:   Lonni Cash, MD    We recommend signing up for the patient portal called MyChart.  Sign up information is provided on this After Visit Summary.  MyChart is used to connect with patients for Virtual Visits (Telemedicine).  Patients are able to view lab/test results, encounter notes, upcoming appointments, etc.  Non-urgent messages can be sent to your provider as well.   To learn more about what you can do with MyChart, go to [email or web address].   Other Instructions

## 2024-01-29 ENCOUNTER — Other Ambulatory Visit: Payer: Self-pay | Admitting: Cardiovascular Disease

## 2024-02-02 NOTE — Telephone Encounter (Signed)
 Labs were scanned into media in epic from [place] back in Sept 2025.

## 2024-02-23 ENCOUNTER — Inpatient Hospital Stay

## 2024-02-23 ENCOUNTER — Inpatient Hospital Stay: Admitting: Hematology
